# Patient Record
Sex: Male | Born: 1960 | Race: Black or African American | Hispanic: No | Smoking: Former smoker
Health system: Southern US, Community
[De-identification: ages and names within clinical notes are randomized; demographics above are authoritative.]

## PROBLEM LIST (undated history)

## (undated) DIAGNOSIS — T7840XA Allergy, unspecified, initial encounter: Secondary | ICD-10-CM

## (undated) DIAGNOSIS — K219 Gastro-esophageal reflux disease without esophagitis: Secondary | ICD-10-CM

## (undated) DIAGNOSIS — IMO0002 Reserved for concepts with insufficient information to code with codable children: Secondary | ICD-10-CM

## (undated) DIAGNOSIS — K5190 Ulcerative colitis, unspecified, without complications: Secondary | ICD-10-CM

## (undated) DIAGNOSIS — R569 Unspecified convulsions: Secondary | ICD-10-CM

## (undated) HISTORY — PX: HERNIA REPAIR: SHX51

## (undated) HISTORY — DX: Allergy, unspecified, initial encounter: T78.40XA

## (undated) HISTORY — DX: Reserved for concepts with insufficient information to code with codable children: IMO0002

## (undated) HISTORY — DX: Gastro-esophageal reflux disease without esophagitis: K21.9

## (undated) HISTORY — DX: Ulcerative colitis, unspecified, without complications: K51.90

## (undated) HISTORY — DX: Unspecified convulsions: R56.9

---

## 2011-01-20 ENCOUNTER — Ambulatory Visit (INDEPENDENT_AMBULATORY_CARE_PROVIDER_SITE_OTHER): Payer: BC Managed Care – PPO

## 2011-01-20 DIAGNOSIS — E291 Testicular hypofunction: Secondary | ICD-10-CM

## 2011-02-20 ENCOUNTER — Ambulatory Visit (INDEPENDENT_AMBULATORY_CARE_PROVIDER_SITE_OTHER): Payer: BC Managed Care – PPO

## 2011-02-20 DIAGNOSIS — E236 Other disorders of pituitary gland: Secondary | ICD-10-CM

## 2011-03-03 ENCOUNTER — Ambulatory Visit (INDEPENDENT_AMBULATORY_CARE_PROVIDER_SITE_OTHER): Payer: BC Managed Care – PPO

## 2011-03-03 DIAGNOSIS — B9789 Other viral agents as the cause of diseases classified elsewhere: Secondary | ICD-10-CM

## 2011-03-05 ENCOUNTER — Ambulatory Visit (INDEPENDENT_AMBULATORY_CARE_PROVIDER_SITE_OTHER): Payer: BC Managed Care – PPO

## 2011-03-05 DIAGNOSIS — H6120 Impacted cerumen, unspecified ear: Secondary | ICD-10-CM

## 2011-03-05 DIAGNOSIS — E291 Testicular hypofunction: Secondary | ICD-10-CM

## 2011-03-05 DIAGNOSIS — J019 Acute sinusitis, unspecified: Secondary | ICD-10-CM

## 2011-03-05 DIAGNOSIS — J40 Bronchitis, not specified as acute or chronic: Secondary | ICD-10-CM

## 2011-03-21 ENCOUNTER — Telehealth: Payer: Self-pay

## 2011-03-21 ENCOUNTER — Ambulatory Visit (INDEPENDENT_AMBULATORY_CARE_PROVIDER_SITE_OTHER): Payer: BC Managed Care – PPO | Admitting: Emergency Medicine

## 2011-03-21 VITALS — BP 106/71 | HR 81 | Temp 98.1°F | Resp 16 | Ht 69.0 in | Wt 179.8 lb

## 2011-03-21 DIAGNOSIS — H6120 Impacted cerumen, unspecified ear: Secondary | ICD-10-CM

## 2011-03-21 DIAGNOSIS — IMO0001 Reserved for inherently not codable concepts without codable children: Secondary | ICD-10-CM

## 2011-03-21 DIAGNOSIS — E291 Testicular hypofunction: Secondary | ICD-10-CM

## 2011-03-21 DIAGNOSIS — J029 Acute pharyngitis, unspecified: Secondary | ICD-10-CM

## 2011-03-21 LAB — POCT RAPID STREP A (OFFICE): Rapid Strep A Screen: NEGATIVE

## 2011-03-21 MED ORDER — TESTOSTERONE CYPIONATE 100 MG/ML IM SOLN
200.0000 mg | Freq: Once | INTRAMUSCULAR | Status: AC
  Administered 2011-03-21: 200 mg via INTRAMUSCULAR

## 2011-03-21 NOTE — Progress Notes (Signed)
Addended by: Areta Haber B on: 03/21/2011 10:19 AM   Modules accepted: Orders

## 2011-03-21 NOTE — Telephone Encounter (Signed)
.  [facility]   PT SAW DR. Joseph Art THIS AM - FOR INSURANCE PURPOSES - REQUESTING [facility] [phone]

## 2011-03-21 NOTE — Patient Instructions (Signed)
Patient is to stop his Mobic. He is placed on Nexium to take one twice a day for the first week and then went back to one a day. He was given a prescription for [facility]'s mouthwash 1 teaspoon [place] gargle 4 times a day.

## 2011-03-21 NOTE — Telephone Encounter (Signed)
See message under contacts.

## 2011-03-21 NOTE — Progress Notes (Signed)
  Subjective:    Patient ID: Marcus Gutierrez, male    DOB: [birth year], 51 y.o.   MRN: [id number]  HPI presents with a one-week history of severe pain in his throat. He is currently on amoxicillin and meloxicam. He has had severe discomfort in his uvula. He's had no chest congestion or cough. He does have an inability to hear well out of his left ear. He also said significant abdominal discomfort as well as distention and tenderness in his upper abdomen.    Review of Systems noncontributory except as related to the present illness.     Objective:   Physical Exam His HEENT exam reveals some wax obstruction of left external auditory canal. There is a 3 mm ulcer on the right side his uvula. There is no adenopathy noted on exam. His abdominal exam reveals marked tenderness in the epigastric area.       Assessment & Plan:  Patient has an ulcer on his uvula which is probably the source of this throat discomfort he has been on Mobic and has probably developed a gastritis or even possibly ulcer related to this medication. His been off his Nexium for the last month. He has approximately 3 days left on his meloxicam amoxicillin prescription. We'll check a strep test ahead and irrigate his left ear pain he is due for his testosterone injection which he receives every 2 weeks.

## 2011-03-22 MED ORDER — ESOMEPRAZOLE MAGNESIUM 40 MG PO CPDR: 40.0000 mg | DELAYED_RELEASE_CAPSULE | Freq: Every day | ORAL | Status: DC

## 2011-03-22 MED ORDER — MAGIC MOUTHWASH W/LIDOCAINE: 5.0000 mL | Freq: Four times a day (QID) | ORAL | Status: DC

## 2011-03-22 NOTE — Progress Notes (Signed)
Addended by: Lesle Chris A on: 03/22/2011 12:00 PM   Modules accepted: Orders

## 2011-03-26 NOTE — Telephone Encounter (Signed)
Dr Everlene Farrier, it looks like pt saw you on 03/21/11 (not sure why phone message says Dr L) and you placed him on Nexium 40. Pt requesting 60 mg - see message.

## 2011-03-26 NOTE — Telephone Encounter (Signed)
Called [facility] who reported that ins covered #30 of Nexium 40 (15 day supply) for pt. Also gave me phone number they have on file for pt since neither #s in our system are working #s. Tried to call pt to ask him what is the problem with Rx and insurance (maybe a Pr Auth is needed?), but no answer/no VM at number given by pharmacy [phone].

## 2011-03-26 NOTE — Telephone Encounter (Signed)
Please notify Mr. Paulsen  that the Nexium does not come in a 60 mg dosage. A dosage of 40 mg per day is the maximum dose. If there is problem with this please give me another note.

## 2011-03-27 NOTE — Telephone Encounter (Signed)
Gave pt instructions from Dr Everlene Farrier about Prilosec if he wants to try that. Also called pharmacy per pts request and authorized them to dispense Nexium 40 in either 30 days at a time, or 90 days per pts preference

## 2011-03-27 NOTE — Telephone Encounter (Signed)
The patient is a generic for Nexium. He can take Prilosec 20 mg and take 2 a day which may be able to give him relief. That is a generic.

## 2011-03-27 NOTE — Telephone Encounter (Signed)
Pt called back and clarified what he needs to ask about Nexium. He wants to check with Dr Everlene Farrier to see if there is a generic that might work as effectively for him. If not, he would like [place] to OK him getting either  30-day or 90-day supply of Nexium 40 at a time depending on the cost and his decision. Pt states we may LMOM at [phone] if necessary.

## 2011-03-27 NOTE — Telephone Encounter (Signed)
Spoke with pt's wife and asked her to have pt CB. Wife agreed.

## 2011-03-30 ENCOUNTER — Telehealth: Payer: Self-pay | Admitting: Emergency Medicine

## 2011-03-30 NOTE — Telephone Encounter (Addendum)
Message copied by Darlyne Russian on Sat Mar 30, 2011  8:14 AM ------      Message from: [place], [place]      Created: Fri Mar 22, 2011 10:24 AM      Regarding: Orders for Visit       Dr. Everlene Farrier,      I looked into the Addendum Notification for Marcus Gutierrez and we will need to get together on this before we "done" this message.  According to [facility], the patient needs 2 orders placed in Epic because you had handed him written scripts.        Corbin Ade      I cannot remember what 2 prescriptions I handed to [name] when he was in the office. We will have to ask him when he returns for his followup visit.

## 2011-04-05 ENCOUNTER — Ambulatory Visit (INDEPENDENT_AMBULATORY_CARE_PROVIDER_SITE_OTHER): Payer: BC Managed Care – PPO | Admitting: Physician Assistant

## 2011-04-05 DIAGNOSIS — E291 Testicular hypofunction: Secondary | ICD-10-CM

## 2011-04-05 DIAGNOSIS — E236 Other disorders of pituitary gland: Secondary | ICD-10-CM

## 2011-04-05 MED ORDER — TESTOSTERONE CYPIONATE 200 MG/ML IM SOLN: 200.0000 mg | Freq: Once | INTRAMUSCULAR | Status: DC

## 2011-04-05 MED ORDER — TESTOSTERONE CYPIONATE 200 MG/ML IM SOLN
200.0000 mg | INTRAMUSCULAR | Status: DC
  Administered 2011-04-05 – 2011-07-08 (×4): 200 mg via INTRAMUSCULAR

## 2011-04-08 NOTE — Progress Notes (Signed)
Testosterone injection

## 2011-04-21 ENCOUNTER — Ambulatory Visit (INDEPENDENT_AMBULATORY_CARE_PROVIDER_SITE_OTHER): Payer: BC Managed Care – PPO | Admitting: Radiology

## 2011-04-21 DIAGNOSIS — E236 Other disorders of pituitary gland: Secondary | ICD-10-CM

## 2011-04-21 DIAGNOSIS — E291 Testicular hypofunction: Secondary | ICD-10-CM

## 2011-05-06 ENCOUNTER — Ambulatory Visit (INDEPENDENT_AMBULATORY_CARE_PROVIDER_SITE_OTHER): Payer: BC Managed Care – PPO | Admitting: Internal Medicine

## 2011-05-06 VITALS — BP 103/69 | HR 99 | Temp 98.1°F | Resp 18 | Ht 69.0 in | Wt 184.0 lb

## 2011-05-06 DIAGNOSIS — E291 Testicular hypofunction: Secondary | ICD-10-CM

## 2011-05-06 NOTE — Progress Notes (Signed)
  Subjective:    Patient ID: Marcus Gutierrez, male    DOB: 03-24-1960, 51 y.o.   MRN: [id number]  HPI    Review of Systems     Objective:   Physical Exam        Assessment & Plan:  Pt needing testosterone level, plans to return next time for lab work.  Testosterone level given today.

## 2011-05-21 ENCOUNTER — Ambulatory Visit (INDEPENDENT_AMBULATORY_CARE_PROVIDER_SITE_OTHER): Payer: BC Managed Care – PPO | Admitting: Physician Assistant

## 2011-05-21 DIAGNOSIS — E291 Testicular hypofunction: Secondary | ICD-10-CM

## 2011-05-21 DIAGNOSIS — E236 Other disorders of pituitary gland: Secondary | ICD-10-CM

## 2011-05-21 MED ORDER — TESTOSTERONE CYPIONATE 200 MG/ML IM SOLN
200.0000 mg | INTRAMUSCULAR | Status: DC
  Administered 2011-05-21 – 2011-08-08 (×5): 200 mg via INTRAMUSCULAR

## 2011-05-21 NOTE — Progress Notes (Signed)
Patient here for testosterone injection. Last PSA 09/2010 normal. Will need recheck Prostate/PSA with next injection. Ok to give testosterone injection.  Marcus Gutierrez

## 2011-06-03 ENCOUNTER — Ambulatory Visit (INDEPENDENT_AMBULATORY_CARE_PROVIDER_SITE_OTHER): Payer: BC Managed Care – PPO | Admitting: Physician Assistant

## 2011-06-03 DIAGNOSIS — E291 Testicular hypofunction: Secondary | ICD-10-CM

## 2011-06-04 LAB — PSA: PSA: 0.7 ng/mL (ref <=4.00)

## 2011-06-04 LAB — TESTOSTERONE: Testosterone: 409.53 ng/dL (ref 300–890)

## 2011-06-24 ENCOUNTER — Telehealth: Payer: Self-pay | Admitting: *Deleted

## 2011-06-24 ENCOUNTER — Ambulatory Visit (INDEPENDENT_AMBULATORY_CARE_PROVIDER_SITE_OTHER): Payer: BC Managed Care – PPO | Admitting: *Deleted

## 2011-06-24 DIAGNOSIS — E291 Testicular hypofunction: Secondary | ICD-10-CM

## 2011-06-24 MED ORDER — TESTOSTERONE CYPIONATE 100 MG/ML IM SOLN: 200.0000 mg | INTRAMUSCULAR | Status: DC

## 2011-06-24 NOTE — Telephone Encounter (Signed)
Pt came in office and noticed that he needs a refill on Testosterone.

## 2011-06-24 NOTE — Telephone Encounter (Signed)
Done and printed

## 2011-06-25 ENCOUNTER — Telehealth: Payer: Self-pay | Admitting: Family Medicine

## 2011-06-25 ENCOUNTER — Other Ambulatory Visit: Payer: Self-pay | Admitting: Family Medicine

## 2011-06-25 ENCOUNTER — Telehealth: Payer: Self-pay | Admitting: Emergency Medicine

## 2011-06-25 MED ORDER — TESTOSTERONE CYPIONATE 200 MG/ML IM SOLN: 200.0000 mg | INTRAMUSCULAR | Status: DC

## 2011-06-25 NOTE — Telephone Encounter (Signed)
LM WITH LADY THAT RX WAS SENT IN

## 2011-06-25 NOTE — Telephone Encounter (Signed)
Message copied by Darlyne Russian on Tue Jun 25, 2011  7:36 AM ------      Message from: Kem Boroughs D      Created: Mon Jun 24, 2011  3:04 PM       Can you review labwork.

## 2011-06-25 NOTE — Telephone Encounter (Signed)
LM WITH LADY TO CB

## 2011-06-25 NOTE — Telephone Encounter (Signed)
Pt returning [place] phone call please contact pt [phone]

## 2011-06-25 NOTE — Telephone Encounter (Signed)
Patient came to office after we closed, banging on the door that his testosterone was sent in incorrectly.  He was let in, and after reviewing his meds, the testosterone wassent in for 136m to inject 2 mls every 14 days.  The 1095mcost is higher than the 20065mial.  Ok per EriMarella Chimes rx the old dosage, 200m63m 1 ml q 14 days, dispense 10ml61mh 2 refills.  Patient given rx.

## 2011-06-25 NOTE — Telephone Encounter (Signed)
Please call patient and let him know his PSA and testosterone levels are normal. He needs to continue the same medication program.

## 2011-06-25 NOTE — Telephone Encounter (Signed)
PT NOTIFIED OF RESULTS AND THAT RX WAS SENT IN

## 2011-07-08 ENCOUNTER — Ambulatory Visit (INDEPENDENT_AMBULATORY_CARE_PROVIDER_SITE_OTHER): Payer: BC Managed Care – PPO | Admitting: Physician Assistant

## 2011-07-08 DIAGNOSIS — E236 Other disorders of pituitary gland: Secondary | ICD-10-CM

## 2011-07-08 DIAGNOSIS — E291 Testicular hypofunction: Secondary | ICD-10-CM

## 2011-07-08 NOTE — Progress Notes (Signed)
Presents for testosterone injection. Due for OV with labs and prostate exam 07/2011.

## 2011-07-22 ENCOUNTER — Ambulatory Visit (INDEPENDENT_AMBULATORY_CARE_PROVIDER_SITE_OTHER): Payer: BC Managed Care – PPO | Admitting: Physician Assistant

## 2011-07-22 ENCOUNTER — Telehealth: Payer: Self-pay

## 2011-07-22 DIAGNOSIS — E291 Testicular hypofunction: Secondary | ICD-10-CM

## 2011-07-22 NOTE — Telephone Encounter (Signed)
Dr. Everlene Farrier, pt just turned 36 and wants to know if you can make him an appt for a colonoscopy. Thanks

## 2011-07-23 ENCOUNTER — Other Ambulatory Visit: Payer: Self-pay | Admitting: Emergency Medicine

## 2011-07-23 DIAGNOSIS — Z139 Encounter for screening, unspecified: Secondary | ICD-10-CM

## 2011-07-23 NOTE — Telephone Encounter (Signed)
Pt.notified

## 2011-07-23 NOTE — Telephone Encounter (Signed)
Call patient and let him know I sent him a referral for screening colonoscopy

## 2011-08-08 ENCOUNTER — Ambulatory Visit (INDEPENDENT_AMBULATORY_CARE_PROVIDER_SITE_OTHER): Payer: BC Managed Care – PPO | Admitting: Physician Assistant

## 2011-08-08 ENCOUNTER — Encounter: Payer: Self-pay | Admitting: Physician Assistant

## 2011-08-08 VITALS — BP 114/77 | HR 91 | Temp 98.4°F | Resp 16 | Ht 68.5 in | Wt 184.0 lb

## 2011-08-08 DIAGNOSIS — E291 Testicular hypofunction: Secondary | ICD-10-CM

## 2011-08-08 NOTE — Progress Notes (Signed)
   Patient ID: Marcus Gutierrez MRN: [id number], DOB: 07-03-1960, 51 y.o. Date of Encounter: 08/08/2011, 7:54 AM  Primary Physician: Kennon Portela, MD  Chief Complaint: Here for testosterone injection  51 y.o. year old male here for testosterone injection. Last injection 07/22/11.  Last PSA 0.70 on 06/03/11. Ok to give testosterone injection. Will need office visit in October, 2013. This was a nursing only encounter. No provider/patient encounter occurred today. Next testosterone injection due on 7/11/3.   SignedChristell Faith, PA-C 08/08/2011 7:54 AM

## 2011-08-21 ENCOUNTER — Ambulatory Visit (INDEPENDENT_AMBULATORY_CARE_PROVIDER_SITE_OTHER): Payer: BC Managed Care – PPO | Admitting: Physician Assistant

## 2011-08-21 DIAGNOSIS — E236 Other disorders of pituitary gland: Secondary | ICD-10-CM

## 2011-08-21 DIAGNOSIS — E291 Testicular hypofunction: Secondary | ICD-10-CM

## 2011-08-21 MED ORDER — TESTOSTERONE CYPIONATE 200 MG/ML IM SOLN
200.0000 mg | INTRAMUSCULAR | Status: DC
  Administered 2011-08-21 – 2011-10-05 (×4): 200 mg via INTRAMUSCULAR

## 2011-08-21 NOTE — Progress Notes (Signed)
   Patient ID: ADE STMARIE MRN: [id number], DOB: 1960-06-16, 51 y.o. Date of Encounter: 08/21/2011, 7:49 AM  Primary Physician: Tally Due, MD  Chief Complaint: Here for testosterone injection  51 y.o. year old male here for testosterone injection. Last injection 08/08/11.  Last PSA 0.70 on 06/03/11. Ok to give testosterone injection. Will need office visit in 3 months. This was a nursing only encounter. No provider/patient encounter occurred today.    Signed, Eula Listen, PA-C 08/21/2011 7:49 AM

## 2011-09-05 ENCOUNTER — Encounter: Payer: BC Managed Care – PPO | Admitting: Physician Assistant

## 2011-09-06 ENCOUNTER — Ambulatory Visit (INDEPENDENT_AMBULATORY_CARE_PROVIDER_SITE_OTHER): Payer: BC Managed Care – PPO | Admitting: Physician Assistant

## 2011-09-06 DIAGNOSIS — E895 Postprocedural testicular hypofunction: Secondary | ICD-10-CM

## 2011-09-06 DIAGNOSIS — E291 Testicular hypofunction: Secondary | ICD-10-CM

## 2011-09-19 ENCOUNTER — Ambulatory Visit (INDEPENDENT_AMBULATORY_CARE_PROVIDER_SITE_OTHER): Payer: BC Managed Care – PPO | Admitting: Physician Assistant

## 2011-09-19 DIAGNOSIS — E291 Testicular hypofunction: Secondary | ICD-10-CM

## 2011-09-19 NOTE — Progress Notes (Signed)
OK to get testosterone injection

## 2011-10-05 ENCOUNTER — Ambulatory Visit (INDEPENDENT_AMBULATORY_CARE_PROVIDER_SITE_OTHER): Payer: BC Managed Care – PPO

## 2011-10-05 DIAGNOSIS — E291 Testicular hypofunction: Secondary | ICD-10-CM

## 2011-10-24 ENCOUNTER — Ambulatory Visit (INDEPENDENT_AMBULATORY_CARE_PROVIDER_SITE_OTHER): Payer: BC Managed Care – PPO | Admitting: Physician Assistant

## 2011-10-24 DIAGNOSIS — E291 Testicular hypofunction: Secondary | ICD-10-CM

## 2011-10-24 MED ORDER — TESTOSTERONE CYPIONATE 200 MG/ML IM SOLN
200.0000 mg | INTRAMUSCULAR | Status: DC
  Administered 2011-10-24: 200 mg via INTRAMUSCULAR

## 2011-10-24 NOTE — Progress Notes (Signed)
Patient here for testosterone injection. Ok to give today. Will need office visit in October 2013.

## 2011-10-29 NOTE — Progress Notes (Signed)
Patient presented for injection only. 

## 2011-10-29 NOTE — Progress Notes (Signed)
Patient presented for injection only.

## 2011-11-14 ENCOUNTER — Ambulatory Visit (INDEPENDENT_AMBULATORY_CARE_PROVIDER_SITE_OTHER): Payer: BC Managed Care – PPO | Admitting: Emergency Medicine

## 2011-11-14 DIAGNOSIS — E291 Testicular hypofunction: Secondary | ICD-10-CM

## 2011-11-14 MED ORDER — TESTOSTERONE CYPIONATE 200 MG/ML IM SOLN
200.0000 mg | Freq: Once | INTRAMUSCULAR | Status: AC
  Administered 2011-11-14: 200 mg via INTRAMUSCULAR

## 2011-11-14 MED ORDER — TESTOSTERONE CYPIONATE 200 MG/ML IM SOLN: 200.0000 mg | Freq: Once | INTRAMUSCULAR | Status: DC

## 2011-11-14 NOTE — Progress Notes (Signed)
Patient presents her for testosterone injection. He will return before the end of the month for labs.

## 2011-11-19 ENCOUNTER — Encounter: Payer: Self-pay | Admitting: Emergency Medicine

## 2011-11-29 ENCOUNTER — Ambulatory Visit (INDEPENDENT_AMBULATORY_CARE_PROVIDER_SITE_OTHER): Payer: BC Managed Care – PPO | Admitting: Physician Assistant

## 2011-11-29 VITALS — BP 128/70 | HR 103 | Temp 98.8°F | Resp 18 | Ht 70.0 in | Wt 177.0 lb

## 2011-11-29 DIAGNOSIS — E291 Testicular hypofunction: Secondary | ICD-10-CM | POA: Insufficient documentation

## 2011-11-29 MED ORDER — TESTOSTERONE CYPIONATE 200 MG/ML IM SOLN
200.0000 mg | Freq: Once | INTRAMUSCULAR | Status: AC
  Administered 2011-11-29: 200 mg via INTRAMUSCULAR

## 2011-11-29 NOTE — Progress Notes (Signed)
Presents for testosterone injection.  He was advised that he needs and OV, with examination and labs with his next injection.

## 2011-12-04 ENCOUNTER — Telehealth: Payer: Self-pay | Admitting: Radiology

## 2011-12-04 ENCOUNTER — Ambulatory Visit: Payer: BC Managed Care – PPO

## 2011-12-04 ENCOUNTER — Ambulatory Visit (INDEPENDENT_AMBULATORY_CARE_PROVIDER_SITE_OTHER): Payer: BC Managed Care – PPO | Admitting: Family Medicine

## 2011-12-04 VITALS — BP 110/90 | HR 109 | Temp 98.3°F | Resp 16 | Ht 69.0 in | Wt 172.0 lb

## 2011-12-04 DIAGNOSIS — R5381 Other malaise: Secondary | ICD-10-CM

## 2011-12-04 DIAGNOSIS — Z125 Encounter for screening for malignant neoplasm of prostate: Secondary | ICD-10-CM

## 2011-12-04 DIAGNOSIS — R509 Fever, unspecified: Secondary | ICD-10-CM

## 2011-12-04 DIAGNOSIS — M2550 Pain in unspecified joint: Secondary | ICD-10-CM

## 2011-12-04 DIAGNOSIS — Q159 Congenital malformation of eye, unspecified: Secondary | ICD-10-CM

## 2011-12-04 LAB — POCT CBC
Granulocyte percent: 59.5 %G (ref 37–80)
HCT, POC: 48 % (ref 43.5–53.7)
Hemoglobin: 14.7 g/dL (ref 14.1–18.1)
Lymph, poc: 3.4 (ref 0.6–3.4)
MCH, POC: 27.9 pg (ref 27–31.2)
MCHC: 30.6 g/dL — AB (ref 31.8–35.4)
MCV: 91 fL (ref 80–97)
MID (cbc): 1.1 — AB (ref 0–0.9)
MPV: 10.1 fL (ref 0–99.8)
POC Granulocyte: 6.6 (ref 2–6.9)
POC LYMPH PERCENT: 30.6 %L (ref 10–50)
POC MID %: 9.9 %M (ref 0–12)
Platelet Count, POC: 320 10*3/uL (ref 142–424)
RBC: 5.27 M/uL (ref 4.69–6.13)
RDW, POC: 13.4 %
WBC: 11.1 10*3/uL — AB (ref 4.6–10.2)

## 2011-12-04 LAB — CK: Total CK: 91 U/L (ref 7–232)

## 2011-12-04 LAB — POCT UA - MICROSCOPIC ONLY
Casts, Ur, LPF, POC: NEGATIVE
Crystals, Ur, HPF, POC: NEGATIVE
Mucus, UA: POSITIVE
Yeast, UA: NEGATIVE

## 2011-12-04 LAB — POCT INFLUENZA A/B
Influenza A, POC: NEGATIVE
Influenza B, POC: NEGATIVE

## 2011-12-04 LAB — COMPREHENSIVE METABOLIC PANEL
ALT: 23 U/L (ref 0–53)
AST: 25 U/L (ref 0–37)
Albumin: 4.1 g/dL (ref 3.5–5.2)
Alkaline Phosphatase: 92 U/L (ref 39–117)
BUN: 12 mg/dL (ref 6–23)
CO2: 24 mEq/L (ref 19–32)
Calcium: 9.1 mg/dL (ref 8.4–10.5)
Chloride: 99 mEq/L (ref 96–112)
Creat: 1.05 mg/dL (ref 0.50–1.35)
Glucose, Bld: 83 mg/dL (ref 70–99)
Potassium: 4.2 mEq/L (ref 3.5–5.3)
Sodium: 137 mEq/L (ref 135–145)
Total Bilirubin: 1.1 mg/dL (ref 0.3–1.2)
Total Protein: 7.6 g/dL (ref 6.0–8.3)

## 2011-12-04 LAB — POCT URINALYSIS DIPSTICK
Glucose, UA: NEGATIVE
Ketones, UA: >=160
Leukocytes, UA: NEGATIVE
Nitrite, UA: NEGATIVE
Protein, UA: 100
Spec Grav, UA: 1.02
Urobilinogen, UA: 1
pH, UA: 6.5

## 2011-12-04 LAB — GLUCOSE, POCT (MANUAL RESULT ENTRY): POC Glucose: 81 mg/dl (ref 70–99)

## 2011-12-04 LAB — POCT SEDIMENTATION RATE: POCT SED RATE: 66 mm/hr — AB (ref 0–22)

## 2011-12-04 LAB — PSA: PSA: 1.64 ng/mL (ref <=4.00)

## 2011-12-04 MED ORDER — DOXYCYCLINE HYCLATE 100 MG PO TABS: 100.0000 mg | ORAL_TABLET | Freq: Two times a day (BID) | ORAL | Status: DC

## 2011-12-04 NOTE — Progress Notes (Signed)
[facility] [address] [place] [address] [phone]  Date:  12/04/2011   Name:  Marcus Gutierrez   DOB:  August 18, 1960   MRN:  [id number]  PCP:  Kennon Portela, MD    Chief Complaint: Generalized Body Aches, Fever, Chest Pain and Chills   History of Present Illness:  Marcus Gutierrez is a 51 y.o. very pleasant male patient who presents with the following:  He has been ill for several days- first became ill on Friday, today is Wednesday.  Early Friday am he woke up with severe aches in his muscles and joints, and then developed sweats, fevers, chills, and pain with deep inspiration.  He does not really have a cough.  He has not had substernal or typical CP, but he notes that his chest hurts when he tries to take a large breath or move his trunk.     He had a fever to 102.4 this am which he treated with antipyretics.  He had a lower grade fever earlier in the illness, but today was his first high fever.  No ST.  Sneezing, no runny nose.  No GI symptoms.  No sick contacts.  He has not been eating a lot but has drunk plenty of water.  He drank at least 4 16 oz glasses today   He has been taking hydrocodone for the body aches for the last couple of days.   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  No foreign travel.  He does not spend a lot of time outdoors or have any particular tick exposure risk.   He did start Verdi in September- he was diagnosed with UC. He has noted that since he started this medication his urine has seemed darker than usual.  Patient Active Problem List  Diagnosis  . Hypogonadism male    Past Medical History  Diagnosis Date  . Allergy   . GERD (gastroesophageal reflux disease)   . Seizures   . Ulcer     Past Surgical History  Procedure Date  . Hernia repair     History  Substance Use Topics  . Smoking status: Former Smoker    Quit date: 06/01/2010  . Smokeless tobacco: Not on file  . Alcohol Use: No    Family History  Problem  Relation Age of Onset  . Cancer Mother   . Heart attack Mother   . Heart attack Father   . Lupus Sister   . Asthma Son     Allergies  Allergen Reactions  . Amoxicillin Other (See Comments)    hematochezia  . Androgel (Testosterone)     Medication list has been reviewed and updated.  Current Outpatient Prescriptions on File Prior to Visit  Medication Sig Dispense Refill  . esomeprazole (NEXIUM) 40 MG capsule Take 1 capsule (40 mg total) by mouth daily before breakfast.  30 capsule  12  . mesalamine (LIALDA) 1.2 G EC tablet Take 1,200 mg by mouth daily with breakfast.      . Alum & Mag Hydroxide-Simeth (MAGIC MOUTHWASH W/LIDOCAINE) SOLN Take 5 mLs by mouth 4 (four) times daily.  120 mL  1  . amoxicillin (AMOXIL) 500 MG capsule Take 500 mg by mouth 2 (two) times daily.      . meloxicam (MOBIC) 7.5 MG tablet Take 7.5 mg by mouth 2 (two) times daily.       Current Facility-Administered Medications on File Prior to Visit  Medication Dose Route Frequency Provider Last Rate Last  Dose  . testosterone cypionate (DEPOTESTOTERONE CYPIONATE) injection 200 mg  200 mg Intramuscular Q14 Days Chelle S Jeffery, PA-C   200 mg at 07/08/11 0914  . testosterone cypionate (DEPOTESTOTERONE CYPIONATE) injection 200 mg  200 mg Intramuscular Q14 Days Areta Haber Dunn, PA-C   200 mg at 08/08/11 0815  . testosterone cypionate (DEPOTESTOTERONE CYPIONATE) injection 200 mg  200 mg Intramuscular Q14 Days Ryan M Dunn, PA-C   200 mg at 10/05/11 5993    Review of Systems:  As per HPI- otherwise negative.  Physical Examination: Filed Vitals:   12/04/11 1355  BP: 110/90  Pulse: 109  Temp: 98.3 F (36.8 C)  Resp: 16   Filed Vitals:   12/04/11 1355  Height: 5' 9"  (1.753 m)  Weight: 172 lb (78.019 kg)   Body mass index is 25.40 kg/(m^2). Ideal Body Weight: Weight in (lb) to have BMI = 25: 168.9   GEN: WDWN, NAD, Non-toxic, A & O x 3 HEENT: Atraumatic, Normocephalic. Neck supple. No masses, No LAD.  Bilateral  TM wnl, oropharynx normal.  PEERL,EOMI.  In his left eye there is what appears to be a scar or small tear of his iris.  Ears and Nose: No external deformity. CV: RRR, No M/G/R. No JVD. No thrill. No extra heart sounds. PULM: CTA B, no wheezes, crackles, rhonchi. No retractions. No resp. distress. No accessory muscle use. ABD: S, NT, ND, +BS. No rebound. No HSM. EXTR: No c/c/e.  No rash, no joint effusions, heat or redness.  NEURO he appears to be sore in his body and to have pain with walking.   Able to reproduce pains by pressing on his chest wall and by moving his knee joints.  PSYCH: Normally interactive. Conversant. Not depressed or anxious appearing.  Calm demeanor.   [facility] reading (PRIMARY) by  Dr. Lorelei Pont.  CXR: no active disease  CHEST - 2 VIEW  Comparison: Preliminary reading of Dr. Lorelei Pont  Findings: Cardiomediastinal silhouette is unremarkable. No acute infiltrate or pleural effusion. No pulmonary edema. Bony thorax is unremarkable.  IMPRESSION: No active disease.   Results for orders placed in visit on 12/04/11  POCT CBC      Component Value Range   WBC 11.1 (*) 4.6 - 10.2 K/uL   Lymph, poc 3.4  0.6 - 3.4   POC LYMPH PERCENT 30.6  10 - 50 %L   MID (cbc) 1.1 (*) 0 - 0.9   POC MID % 9.9  0 - 12 %M   POC Granulocyte 6.6  2 - 6.9   Granulocyte percent 59.5  37 - 80 %G   RBC 5.27  4.69 - 6.13 M/uL   Hemoglobin 14.7  14.1 - 18.1 g/dL   HCT, POC 48.0  43.5 - 53.7 %   MCV 91.0  80 - 97 fL   MCH, POC 27.9  27 - 31.2 pg   MCHC 30.6 (*) 31.8 - 35.4 g/dL   RDW, POC 13.4     Platelet Count, POC 320  142 - 424 K/uL   MPV 10.1  0 - 99.8 fL  GLUCOSE, POCT (MANUAL RESULT ENTRY)      Component Value Range   POC Glucose 81  70 - 99 mg/dl  POCT INFLUENZA A/B      Component Value Range   Influenza A, POC Negative     Influenza B, POC Negative    COMPREHENSIVE METABOLIC PANEL      Component Value Range   Sodium 137  135 - 145  mEq/L   Potassium 4.2  3.5 - 5.3 mEq/L   Chloride  99  96 - 112 mEq/L   CO2 24  19 - 32 mEq/L   Glucose, Bld 83  70 - 99 mg/dL   BUN 12  6 - 23 mg/dL   Creat 1.05  0.50 - 1.35 mg/dL   Total Bilirubin 1.1  0.3 - 1.2 mg/dL   Alkaline Phosphatase 92  39 - 117 U/L   AST 25  0 - 37 U/L   ALT 23  0 - 53 U/L   Total Protein 7.6  6.0 - 8.3 g/dL   Albumin 4.1  3.5 - 5.2 g/dL   Calcium 9.1  8.4 - 10.5 mg/dL  CK      Component Value Range   Total CK 91  7 - 232 U/L  PSA      Component Value Range   PSA    <=4.00 ng/mL  POCT URINALYSIS DIPSTICK      Component Value Range   Color, UA amber     Clarity, UA clear     Glucose, UA neg     Bilirubin, UA moderate     Ketones, UA >=160     Spec Grav, UA 1.020     Blood, UA trace-lysed     pH, UA 6.5     Protein, UA 100 mg/dL     Urobilinogen, UA 1.0     Nitrite, UA neg     Leukocytes, UA Negative    POCT UA - MICROSCOPIC ONLY      Component Value Range   WBC, Ur, HPF, POC 3-10     RBC, urine, microscopic 4-6     Bacteria, U Microscopic 1+     Mucus, UA positive     Epithelial cells, urine per micros 0-1     Crystals, Ur, HPF, POC neg     Casts, Ur, LPF, POC neg     Yeast, UA neg    POCT SEDIMENTATION RATE      Component Value Range   POCT SED RATE 66 (*) 0 - 22 mm/hr    Assessment and Plan: 1. Fever  POCT CBC, POCT Influenza A/B, DG Chest 2 View  2. Malaise  POCT glucose (manual entry), POCT urinalysis dipstick, POCT UA - Microscopic Only, POCT SEDIMENTATION RATE  3. Joint pain  Comprehensive metabolic panel, CK  4. Screening for prostate cancer  PSA   Jevan is here with a cluster of symptoms today.  I am concerned about his urine findings and have ordered a STAT CMP.  Depending on these results may treat for possible RMSF vs sending him to the ED for further treatment (if any significant liver or kidney lab abnl).  (Lialda could cause liver or kidney toxicity)  COPLAND,JESSICA, MD  Received CMP results- they are normal.  Called and spoke with Everlean Patterson.   We will start  doxycycline as RMSF is a possibility.  This could also be a manifestation of his UC.  Urine culture is pending but pyelo is not likely.  He is to call or go to the ED if he gets worse or has any concerns tonight.  Otherwise plan to have him call his GI doctor tomorrow regarding his symptoms.  I will call him tomorrow and check on him as well.    He has an unusual finding in his left iris.  This is not acute, but needs to be evaluated.  Will refer to opthalmology for an exam.  His vision is normal and he was not aware of anything amiss.

## 2011-12-04 NOTE — Telephone Encounter (Signed)
Stat labs are in for patient, CK is 91, other labs in computer please advise if further is needed.

## 2011-12-05 ENCOUNTER — Telehealth: Payer: Self-pay | Admitting: Family Medicine

## 2011-12-05 NOTE — Telephone Encounter (Signed)
Called him to check on him.  He is doing about the same today.  He did not feel he needed to come in for a recheck today.  He will be sure to let me know if any changes in his condition.  Otherwise I will call and check on him tomorrow.

## 2011-12-05 NOTE — Telephone Encounter (Signed)
CK is normal as is his electrolytes - I would call patient and check on his status

## 2011-12-05 NOTE — Progress Notes (Signed)
This encounter was created in error - please disregard.

## 2011-12-06 ENCOUNTER — Telehealth: Payer: Self-pay | Admitting: Family Medicine

## 2011-12-06 NOTE — Telephone Encounter (Signed)
Called and talked with him- he is about the same, maybe a bit better. He is having low grade temps to around 99 degrees.  He did agree to come in for a recheck tomorrow am.  I gave him Dr. Thompson Caul name and he will ask for her

## 2011-12-06 NOTE — Telephone Encounter (Signed)
Dr Patsy Lager called pt at 3:33 on 10/24 to check on pt -see notes 10/24 phone message

## 2011-12-07 ENCOUNTER — Ambulatory Visit: Payer: BC Managed Care – PPO

## 2011-12-07 ENCOUNTER — Ambulatory Visit (INDEPENDENT_AMBULATORY_CARE_PROVIDER_SITE_OTHER): Payer: BC Managed Care – PPO | Admitting: Family Medicine

## 2011-12-07 VITALS — BP 113/78 | HR 102 | Temp 98.1°F | Resp 18 | Ht 69.0 in | Wt 172.0 lb

## 2011-12-07 DIAGNOSIS — R509 Fever, unspecified: Secondary | ICD-10-CM

## 2011-12-07 DIAGNOSIS — M542 Cervicalgia: Secondary | ICD-10-CM

## 2011-12-07 DIAGNOSIS — M791 Myalgia, unspecified site: Secondary | ICD-10-CM

## 2011-12-07 DIAGNOSIS — M25569 Pain in unspecified knee: Secondary | ICD-10-CM

## 2011-12-07 DIAGNOSIS — IMO0001 Reserved for inherently not codable concepts without codable children: Secondary | ICD-10-CM

## 2011-12-07 DIAGNOSIS — K5190 Ulcerative colitis, unspecified, without complications: Secondary | ICD-10-CM

## 2011-12-07 DIAGNOSIS — M2550 Pain in unspecified joint: Secondary | ICD-10-CM

## 2011-12-07 LAB — COMPREHENSIVE METABOLIC PANEL
ALT: 51 U/L (ref 0–53)
AST: 56 U/L — ABNORMAL HIGH (ref 0–37)
Albumin: 3.8 g/dL (ref 3.5–5.2)
Alkaline Phosphatase: 147 U/L — ABNORMAL HIGH (ref 39–117)
BUN: 9 mg/dL (ref 6–23)
CO2: 28 mEq/L (ref 19–32)
Calcium: 9.5 mg/dL (ref 8.4–10.5)
Chloride: 101 mEq/L (ref 96–112)
Creat: 1.04 mg/dL (ref 0.50–1.35)
Glucose, Bld: 90 mg/dL (ref 70–99)
Potassium: 4.4 mEq/L (ref 3.5–5.3)
Sodium: 137 mEq/L (ref 135–145)
Total Bilirubin: 0.7 mg/dL (ref 0.3–1.2)
Total Protein: 7.7 g/dL (ref 6.0–8.3)

## 2011-12-07 LAB — POCT UA - MICROSCOPIC ONLY
Bacteria, U Microscopic: NEGATIVE
Casts, Ur, LPF, POC: NEGATIVE
Crystals, Ur, HPF, POC: NEGATIVE
Epithelial cells, urine per micros: NEGATIVE
Yeast, UA: NEGATIVE

## 2011-12-07 LAB — POCT CBC
Granulocyte percent: 53.3 %G (ref 37–80)
HCT, POC: 45.4 % (ref 43.5–53.7)
Hemoglobin: 14.5 g/dL (ref 14.1–18.1)
Lymph, poc: 2.6 (ref 0.6–3.4)
MCH, POC: 28.9 pg (ref 27–31.2)
MCHC: 31.9 g/dL (ref 31.8–35.4)
MCV: 90.5 fL (ref 80–97)
MID (cbc): 0.8 (ref 0–0.9)
MPV: 9.5 fL (ref 0–99.8)
POC Granulocyte: 3.8 (ref 2–6.9)
POC LYMPH PERCENT: 36.2 %L (ref 10–50)
POC MID %: 10.5 %M (ref 0–12)
Platelet Count, POC: 425 10*3/uL — AB (ref 142–424)
RBC: 5.02 M/uL (ref 4.69–6.13)
RDW, POC: 13.3 %
WBC: 7.2 10*3/uL (ref 4.6–10.2)

## 2011-12-07 LAB — POCT URINALYSIS DIPSTICK
Blood, UA: NEGATIVE
Glucose, UA: NEGATIVE
Nitrite, UA: NEGATIVE
Spec Grav, UA: 1.015
Urobilinogen, UA: 1
pH, UA: 6

## 2011-12-07 LAB — URINE CULTURE
Colony Count: NO GROWTH
Organism ID, Bacteria: NO GROWTH

## 2011-12-07 LAB — POCT SEDIMENTATION RATE: POCT SED RATE: 64 mm/hr — AB (ref 0–22)

## 2011-12-07 LAB — RHEUMATOID FACTOR: Rhuematoid fact SerPl-aCnc: <10 IU/mL (ref <=14)

## 2011-12-07 MED ORDER — HYDROCODONE-ACETAMINOPHEN 5-500 MG PO TABS: 1.0000 | ORAL_TABLET | Freq: Four times a day (QID) | ORAL | Status: DC | PRN

## 2011-12-07 NOTE — Progress Notes (Signed)
[address]   [place], [place]  [address]   [phone]  Subjective:    Patient ID: Marcus Gutierrez, male    DOB: October 29, 1960, 51 y.o.   MRN: [id number]  HPIThis 51 y.o. male presents for evaluation of 72 hour follow-up for fever, malaise, arthralgias, myalgias.  Evaluated by Dr. Patsy Lager three days ago.  WBC count elevated 11,100.  Urine culture negative.  CMET normal.  PSA normal at 1.43.  CK normal.  CXR negative.  Urine abnormal with large ketones, SG 1.025.  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 Patsy Lager after normal results returned.  Dr. Patsy Lager advised follow-up today due to severity of symptoms.   No worsening symptoms but no significant improvement.  Nighttimes are difficult due to pain in neck and upper shoulders and back; chest still hurting.  Slight improvement in ches tpain.  Still having knee pain across knees; having a hard time walking.  Going up and down stairs or bending knees is very difficult.  This morning temperature 98.1 this morning.  Last fever 24 hours ago.  No sore throat, rhinorrhea, nasal congestion.  No cough; no SOB.  +pain with deep inspiration.  No chest tightness.  Soreness.  No n/v/d.  No abdominal pain.  No bloody stools; no change in bowel habits.  Started Doxycycline three days ago.  No dysuria; yellow discoloration to urine; urine is dark.  No hematuria.  No frequency; urgency.  Nocturia x 1 last night which is unusual for patient.  Has been drinking more fluids since last visit.  Appetite is down but eating moderately well especially since last visit.  No rash.  No headaches since last visit; mild headaches before last visit.  Onset of symptoms started nine days ago; initially started with tingling in legs; awoke in middle of night with arthralgias/knee pain and difficulties walking. Awoke the following morning with body aches and joint aches.  Not sure if had fever at that point.  No foreign travel.  Did go to country two weeks ago for funeral; went out in a  field.  No known bites.  L anerior thigh there are marks that may be bites.  Dental surgery 08/2011.  No confusion; no photophobia.  PMH;  Ulcerative colitis, GERD, hypogonadism. Psurg:  Hernia surgery 40 years ago. 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  No tobacco; no alcohol.     Review of Systems  Constitutional: Positive for fever and fatigue. Negative for chills and diaphoresis.  HENT: Positive for neck pain and neck stiffness. Negative for ear pain, nosebleeds, congestion, facial swelling, rhinorrhea, sneezing, postnasal drip and ear discharge.   Eyes: Negative for photophobia, redness and visual disturbance.  Respiratory: Negative for cough, chest tightness, shortness of breath, wheezing and stridor.   Cardiovascular: Positive for chest pain. Negative for palpitations and leg swelling.  Gastrointestinal: Negative for nausea, vomiting, abdominal pain, diarrhea, constipation, blood in stool and abdominal distention.  Genitourinary: Negative for dysuria, urgency, frequency, hematuria, flank pain, scrotal swelling and genital sores.  Skin: Positive for wound. Negative for color change, pallor and rash.  Neurological: Negative for dizziness, syncope, weakness, light-headedness, numbness and headaches.  Hematological: Negative for adenopathy. Does not bruise/bleed easily.  Psychiatric/Behavioral: Negative for confusion.    Past Medical History  Diagnosis Date  . Allergy   . GERD (gastroesophageal reflux disease)   . Seizures   . Ulcer     Past Surgical History  Procedure Date  . Hernia repair     Prior to Admission  medications   Medication Sig Start Date End Date Taking? Authorizing Provider  Alum & Mag Hydroxide-Simeth (MAGIC MOUTHWASH W/LIDOCAINE) SOLN Take 5 mLs by mouth 4 (four) times daily. 03/22/11  Yes Collene Gobble, MD  Cholecalciferol (VITAMIN D3) 1000 UNITS CAPS Take 1 capsule by mouth daily.   Yes Historical Provider, MD  doxycycline (VIBRA-TABS) 100 MG tablet Take 1 tablet (100  mg total) by mouth 2 (two) times daily. 12/04/11  Yes Gwenlyn Found Copland, MD  esomeprazole (NEXIUM) 40 MG capsule Take 1 capsule (40 mg total) by mouth daily before breakfast. 03/22/11  Yes Collene Gobble, MD  meloxicam (MOBIC) 7.5 MG tablet Take 7.5 mg by mouth 2 (two) times daily.   Yes Historical Provider, MD  mesalamine (LIALDA) 1.2 G EC tablet Take 1,200 mg by mouth daily with breakfast.   Yes Historical Provider, MD  testosterone cypionate (DEPOTESTOTERONE CYPIONATE) 200 MG/ML injection Inject 200 mg into the muscle every 14 (fourteen) days.   Yes Historical Provider, MD  HYDROcodone-acetaminophen (VICODIN) 5-500 MG per tablet Take 1 tablet by mouth every 6 (six) hours as needed for pain. 12/07/11   Ethelda Chick, MD    Allergies  Allergen Reactions  . Amoxicillin Other (See Comments)    hematochezia  . Androgel (Testosterone)     History   Social History  . Marital Status: Single    Spouse Name: N/A    Number of Children: N/A  . Years of Education: N/A   Occupational History  . Not on file.   Social History Main Topics  . Smoking status: Former Smoker    Quit date: 06/01/2010  . Smokeless tobacco: Not on file  . Alcohol Use: No  . Drug Use: No  . Sexually Active: Not on file   Other Topics Concern  . Not on file   Social History Narrative  . No narrative on file    Family History  Problem Relation Age of Onset  . Cancer Mother   . Heart attack Mother   . Heart attack Father   . Lupus Sister   . Asthma Son        Objective:   Physical Exam  Nursing note and vitals reviewed. Constitutional: He is oriented to person, place, and time. He appears well-developed and well-nourished. No distress.       Non-toxic.  Non-ill in appearance.  HENT:  Head: Normocephalic and atraumatic.  Right Ear: External ear normal.  Left Ear: External ear normal.  Nose: Nose normal.  Mouth/Throat: Oropharynx is clear and moist. No oropharyngeal exudate.  Eyes: Conjunctivae  normal and EOM are normal. Pupils are equal, round, and reactive to light.       L pupil with well demarcated white lesion present with gross inspection with light.  Neck: Normal range of motion. Neck supple. No JVD present. No thyromegaly present.  Cardiovascular: Normal rate, regular rhythm, normal heart sounds and intact distal pulses.  Exam reveals no gallop and no friction rub.   No murmur heard. Pulmonary/Chest: Effort normal and breath sounds normal. No respiratory distress. He has no wheezes. He has no rales. He exhibits tenderness.       +TTP L LATERAL CHEST.  Abdominal: Soft. Bowel sounds are normal. He exhibits no distension and no mass. There is no tenderness. There is no rebound and no guarding.  Musculoskeletal: He exhibits no edema.       Right shoulder: He exhibits normal range of motion, no tenderness and no bony tenderness.  Left shoulder: He exhibits normal range of motion, no tenderness and no bony tenderness.       Right knee: He exhibits decreased range of motion. He exhibits no swelling, no effusion, no ecchymosis and no erythema. no tenderness found.       Left knee: He exhibits decreased range of motion. He exhibits no swelling, no effusion, no ecchymosis and no erythema. no tenderness found.       Cervical back: He exhibits decreased range of motion, tenderness, bony tenderness and pain. He exhibits no spasm and normal pulse.       CERVICAL SPINE:  MIDLINE TTP AT C4-5. B KNEES: NO EFFUSION; DECREASED ROM B KNEES; NO EFFUSION; GAIT SLOWED AND ABNORMAL.  Lymphadenopathy:    He has no cervical adenopathy.  Neurological: He is alert and oriented to person, place, and time. No cranial nerve deficit. He exhibits normal muscle tone. Coordination normal.  Skin: Skin is warm. He is not diaphoretic.       L INNER THIGH:  TWO DISCRETE HEALING ESCHAR LESIONS WITHOUT ERYTHEMA; 3 MM DIAMETER EACH LESION.  Psychiatric: He has a normal mood and affect. His behavior is normal.  Judgment and thought content normal.       Results for orders placed in visit on 12/07/11  POCT CBC      Component Value Range   WBC 7.2  4.6 - 10.2 K/uL   Lymph, poc 2.6  0.6 - 3.4   POC LYMPH PERCENT 36.2  10 - 50 %L   MID (cbc) 0.8  0 - 0.9   POC MID % 10.5  0 - 12 %M   POC Granulocyte 3.8  2 - 6.9   Granulocyte percent 53.3  37 - 80 %G   RBC 5.02  4.69 - 6.13 M/uL   Hemoglobin 14.5  14.1 - 18.1 g/dL   HCT, POC 16.1  09.6 - 53.7 %   MCV 90.5  80 - 97 fL   MCH, POC 28.9  27 - 31.2 pg   MCHC 31.9  31.8 - 35.4 g/dL   RDW, POC 04.5     Platelet Count, POC 425 (*) 142 - 424 K/uL   MPV 9.5  0 - 99.8 fL  POCT UA - MICROSCOPIC ONLY      Component Value Range   WBC, Ur, HPF, POC 2-5     RBC, urine, microscopic 0-2     Bacteria, U Microscopic neg     Mucus, UA moderate     Epithelial cells, urine per micros neg     Crystals, Ur, HPF, POC neg     Casts, Ur, LPF, POC neg     Yeast, UA neg    POCT URINALYSIS DIPSTICK      Component Value Range   Color, UA amber     Clarity, UA clear     Glucose, UA neg     Bilirubin, UA small     Ketones, UA trace     Spec Grav, UA 1.015     Blood, UA neg     pH, UA 6.0     Protein, UA 30mg /dL     Urobilinogen, UA 1.0     Nitrite, UA neg     Leukocytes, UA Trace     [facility] reading (PRIMARY) by  Dr. Katrinka Blazing.  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  R knee: NAD.   Assessment & Plan:   1. Fever  DG Cervical Spine Complete, DG Knee 1-2  Views Right, POCT CBC, POCT SEDIMENTATION RATE, POCT UA - Microscopic Only, POCT urinalysis dipstick, Comprehensive metabolic panel, ANA, B. burgdorfi antibodies, Rheumatoid factor, Rocky mtn spotted fvr ab, IgM-blood  2. Myalgia  DG Cervical Spine Complete, DG Knee 1-2 Views Right, POCT CBC, POCT SEDIMENTATION RATE, POCT UA - Microscopic Only, POCT urinalysis dipstick, Comprehensive metabolic panel, ANA, B. burgdorfi antibodies, Rheumatoid factor, Rocky mtn spotted fvr ab, IgM-blood  3.  Arthralgia  DG Cervical Spine Complete, DG Knee 1-2 Views Right, POCT CBC, POCT SEDIMENTATION RATE, POCT UA - Microscopic Only, POCT urinalysis dipstick, Comprehensive metabolic panel, ANA, B. burgdorfi antibodies, Rheumatoid factor, Rocky mtn spotted fvr ab, IgM-blood  4. Knee pain  DG Cervical Spine Complete, DG Knee 1-2 Views Right, POCT CBC, POCT SEDIMENTATION RATE, POCT UA - Microscopic Only, POCT urinalysis dipstick, Comprehensive metabolic panel, ANA, B. burgdorfi antibodies, Rheumatoid factor, Rocky mtn spotted fvr ab, IgM-blood  5. Neck pain  DG Cervical Spine Complete, DG Knee 1-2 Views Right, POCT CBC, POCT SEDIMENTATION RATE, POCT UA - Microscopic Only, POCT urinalysis dipstick, Comprehensive metabolic panel, ANA, B. burgdorfi antibodies, Rheumatoid factor, Rocky mtn spotted fvr ab, IgM-blood  6. Ulcerative colitis  DG Cervical Spine Complete, DG Knee 1-2 Views Right, POCT CBC, POCT SEDIMENTATION RATE, POCT UA - Microscopic Only, POCT urinalysis dipstick, Comprehensive metabolic panel, ANA, B. burgdorfi antibodies, Rheumatoid factor, Rocky mtn spotted fvr ab, IgM-blood       1. Fever: Improving; afebrile for past 24 hours.  Onset of fever approximately nine days ago; duration of fever eight days total.  WBC improved from visit three days ago.  Obtain RMSF, ANA, RF. 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  Associated with arthralgias so autoimmune process considered in ddx.  Non-toxic in appearance.  Close follow-up. 2.  Arthralgias:  Persistent despite improvement in fever in past 24 hours.  Obtain ESR, RF, ANA.  Refill of hydrocodone provided.  OOW note provided. 3.  Neck pain:  New.  Associated with fever, malaise. No associated meningitis symptoms (no nuchal rigidity, confusion, photophobia, altered mental status).   C-spine film with DDD.  Monitor closely; if worsens or persists, will warrant MRI cervical spine to evaluate for diskitis, other pathology. 4. Knee pain B: New.  Associated  with fever, neck pain.  No associated erythema, effusion, swelling.  Rx for hydrocodone provided. 5. Ulcerative Colitis:  Stable; no active UC symptoms.

## 2011-12-08 ENCOUNTER — Encounter: Payer: Self-pay | Admitting: Family Medicine

## 2011-12-09 ENCOUNTER — Telehealth: Payer: Self-pay | Admitting: Family Medicine

## 2011-12-09 DIAGNOSIS — E291 Testicular hypofunction: Secondary | ICD-10-CM

## 2011-12-09 LAB — ROCKY MTN SPOTTED FVR AB, IGM-BLOOD: ROCKY MTN SPOTTED FEVER, IGM: 0.21 IV

## 2011-12-09 LAB — ANA: Anti Nuclear Antibody(ANA): NEGATIVE

## 2011-12-09 NOTE — Telephone Encounter (Signed)
Called Pt to check to see how he is doing- phone # disconnected. Mailing letter and putting a future order in epic for Pt to have PSA rechecked in 3 weeks. Eileen Stanford

## 2011-12-10 LAB — B. BURGDORFI ANTIBODIES: B burgdorferi Ab IgG+IgM: 0.5 {ISR}

## 2011-12-11 ENCOUNTER — Ambulatory Visit (INDEPENDENT_AMBULATORY_CARE_PROVIDER_SITE_OTHER): Payer: BC Managed Care – PPO | Admitting: Emergency Medicine

## 2011-12-11 VITALS — BP 115/77 | HR 81 | Temp 98.7°F | Resp 18 | Wt 177.0 lb

## 2011-12-11 DIAGNOSIS — K529 Noninfective gastroenteritis and colitis, unspecified: Secondary | ICD-10-CM

## 2011-12-11 DIAGNOSIS — M791 Myalgia, unspecified site: Secondary | ICD-10-CM

## 2011-12-11 DIAGNOSIS — E291 Testicular hypofunction: Secondary | ICD-10-CM

## 2011-12-11 DIAGNOSIS — R109 Unspecified abdominal pain: Secondary | ICD-10-CM

## 2011-12-11 DIAGNOSIS — IMO0001 Reserved for inherently not codable concepts without codable children: Secondary | ICD-10-CM

## 2011-12-11 LAB — POCT CBC
Granulocyte percent: 61.1 %G (ref 37–80)
HCT, POC: 45.6 % (ref 43.5–53.7)
Hemoglobin: 13.9 g/dL — AB (ref 14.1–18.1)
Lymph, poc: 2.8 (ref 0.6–3.4)
MCH, POC: 28 pg (ref 27–31.2)
MCHC: 30.5 g/dL — AB (ref 31.8–35.4)
MCV: 91.7 fL (ref 80–97)
MID (cbc): 0.6 (ref 0–0.9)
MPV: 8.7 fL (ref 0–99.8)
POC Granulocyte: 5.3 (ref 2–6.9)
POC LYMPH PERCENT: 32.1 %L (ref 10–50)
POC MID %: 6.8 %M (ref 0–12)
Platelet Count, POC: 564 10*3/uL — AB (ref 142–424)
RBC: 4.97 M/uL (ref 4.69–6.13)
RDW, POC: 14 %
WBC: 8.6 10*3/uL (ref 4.6–10.2)

## 2011-12-11 LAB — POCT URINALYSIS DIPSTICK
Bilirubin, UA: NEGATIVE
Blood, UA: NEGATIVE
Glucose, UA: NEGATIVE
Ketones, UA: NEGATIVE
Leukocytes, UA: NEGATIVE
Nitrite, UA: NEGATIVE
Protein, UA: NEGATIVE
Spec Grav, UA: <=1.005
Urobilinogen, UA: 0.2
pH, UA: 6

## 2011-12-11 LAB — IFOBT (OCCULT BLOOD): IFOBT: NEGATIVE

## 2011-12-11 LAB — CK: Total CK: 56 U/L (ref 7–232)

## 2011-12-11 LAB — POCT SEDIMENTATION RATE: POCT SED RATE: 55 mm/hr — AB (ref 0–22)

## 2011-12-11 LAB — POCT UA - MICROSCOPIC ONLY
Bacteria, U Microscopic: NEGATIVE
Casts, Ur, LPF, POC: NEGATIVE
Crystals, Ur, HPF, POC: NEGATIVE
Mucus, UA: NEGATIVE
RBC, urine, microscopic: NEGATIVE
WBC, Ur, HPF, POC: NEGATIVE
Yeast, UA: NEGATIVE

## 2011-12-11 LAB — COMPREHENSIVE METABOLIC PANEL
ALT: 119 U/L — ABNORMAL HIGH (ref 0–53)
AST: 90 U/L — ABNORMAL HIGH (ref 0–37)
Albumin: 4 g/dL (ref 3.5–5.2)
Alkaline Phosphatase: 265 U/L — ABNORMAL HIGH (ref 39–117)
BUN: 11 mg/dL (ref 6–23)
CO2: 26 mEq/L (ref 19–32)
Calcium: 9.6 mg/dL (ref 8.4–10.5)
Chloride: 103 mEq/L (ref 96–112)
Creat: 0.94 mg/dL (ref 0.50–1.35)
Glucose, Bld: 94 mg/dL (ref 70–99)
Potassium: 4.8 mEq/L (ref 3.5–5.3)
Sodium: 138 mEq/L (ref 135–145)
Total Bilirubin: 0.6 mg/dL (ref 0.3–1.2)
Total Protein: 7.5 g/dL (ref 6.0–8.3)

## 2011-12-11 LAB — C-REACTIVE PROTEIN: CRP: 5.6 mg/dL — ABNORMAL HIGH (ref <0.60)

## 2011-12-11 MED ORDER — TESTOSTERONE CYPIONATE 200 MG/ML IM SOLN
200.0000 mg | Freq: Once | INTRAMUSCULAR | Status: AC
  Administered 2011-12-11: 200 mg via INTRAMUSCULAR

## 2011-12-11 NOTE — Progress Notes (Signed)
Testosterone injection 

## 2011-12-11 NOTE — Progress Notes (Signed)
  Subjective:    Patient ID: Marcus Gutierrez, male    DOB: Nov 15, 1960, 51 y.o.   MRN: [id number]  HPI  Follow up from Saturday with Dr. Katrinka Blazing.  Spot on left eye, fever, .  Seen Dr. Patsy Lager on Wednesday.  Feeling  A little better but still having pain and body aches.  Also would like to have a prostate exam.  Would like to discuss ulcerative colitis.  Urine has looked like tea and did not get that way until he started lialda.  Being able to bend over and pick things up is a task for him. Initially his white count and sedimentation rate were elevated. On repeat testing on Saturday his white count had returned to normal but his sedimentation rate remains elevated. Repeat LFTs reveal mild increase in his alkaline phosphatase and his AST. He continues on doxycycline but no other medications at present. He is aching and knowledge joints but worse in his knees. He states he aches when he tries to do any ambulation or going up steps. He has not had any fever or chills. He has not had a sore throat or productive cough. He does not feel like he has had an exacerbation of his known ulcerative colitis    Review of Systems     Objective:   Physical Exam patient looks like he does not feel well but does not look toxic. His neck is supple. Chest is clear to auscultation and percussion. Heart is regular rate without murmurs rubs or gallops. The abdomen is flat liver and spleen not large no areas of tenderness or masses palpable. Examination of the knees does not reveal any joint swelling.        Assessment & Plan:  Patient hasn't comes compatible with a flulike illness versus a form of connective tissue disease he does have ulcerative colitis and certainly this could be tied into that disorder. Since his LFTs were increasing and went ahead and checked EBV CMV and hepatitis panel on him. Because of his complaints of arthralgias and myalgias and a CPK. His sedimentation rate has been elevated in the 60s and I have  repeated that. His white count today was normal and 6 clinically the patient states he is beginning to feel somewhat better. His titers for Lyme disease and rocky mountain spotted fever were both negative. I plan to see him back in a week. I left him out of work. As instructed the

## 2011-12-11 NOTE — Progress Notes (Deleted)
  Subjective:    Patient ID: Marcus Gutierrez, male    DOB: 07/12/1960, 51 y.o.   MRN: [id number]  HPI    Review of Systems     Objective:   Physical Exam        Assessment & Plan:   

## 2011-12-12 LAB — PSA: PSA: 2.09 ng/mL (ref <=4.00)

## 2011-12-12 LAB — HEPATITIS PANEL, ACUTE
HCV Ab: NEGATIVE
Hep A IgM: NEGATIVE
Hep B C IgM: NEGATIVE
Hepatitis B Surface Ag: NEGATIVE

## 2011-12-12 LAB — CMV IGM: CMV IgM: 0.15 (ref <0.90)

## 2011-12-12 LAB — EPSTEIN-BARR VIRUS VCA ANTIBODY PANEL
EBV EA IgG: <5 U/mL (ref <9.0)
EBV NA IgG: 475 U/mL — ABNORMAL HIGH (ref <18.0)
EBV VCA IgG: 263 U/mL — ABNORMAL HIGH (ref <18.0)
EBV VCA IgM: <10 U/mL (ref <36.0)

## 2011-12-17 ENCOUNTER — Telehealth: Payer: Self-pay | Admitting: Family Medicine

## 2011-12-17 NOTE — Telephone Encounter (Signed)
Called but he was not at home and cell phone number did not work.

## 2011-12-17 NOTE — Telephone Encounter (Signed)
He later did call back- he has an appt tomorrow to see Dr. Cleta Alberts.  Let him know I was just checking on him

## 2011-12-18 ENCOUNTER — Ambulatory Visit (INDEPENDENT_AMBULATORY_CARE_PROVIDER_SITE_OTHER): Payer: BC Managed Care – PPO | Admitting: Emergency Medicine

## 2011-12-18 VITALS — BP 109/74 | HR 58 | Temp 98.2°F | Resp 18 | Ht 70.0 in | Wt 173.0 lb

## 2011-12-18 DIAGNOSIS — R7989 Other specified abnormal findings of blood chemistry: Secondary | ICD-10-CM

## 2011-12-18 DIAGNOSIS — M791 Myalgia, unspecified site: Secondary | ICD-10-CM

## 2011-12-18 DIAGNOSIS — R109 Unspecified abdominal pain: Secondary | ICD-10-CM

## 2011-12-18 DIAGNOSIS — R972 Elevated prostate specific antigen [PSA]: Secondary | ICD-10-CM

## 2011-12-18 LAB — POCT UA - MICROSCOPIC ONLY
Bacteria, U Microscopic: NEGATIVE
Casts, Ur, LPF, POC: NEGATIVE
Crystals, Ur, HPF, POC: NEGATIVE
Mucus, UA: NEGATIVE
Yeast, UA: NEGATIVE

## 2011-12-18 LAB — POCT URINALYSIS DIPSTICK
Bilirubin, UA: NEGATIVE
Blood, UA: NEGATIVE
Glucose, UA: NEGATIVE
Leukocytes, UA: NEGATIVE
Nitrite, UA: NEGATIVE
Spec Grav, UA: 1.025
Urobilinogen, UA: 0.2
pH, UA: 6

## 2011-12-18 LAB — POCT CBC
Granulocyte percent: 51.1 %G (ref 37–80)
HCT, POC: 48.4 % (ref 43.5–53.7)
Hemoglobin: 14.8 g/dL (ref 14.1–18.1)
Lymph, poc: 3.7 — AB (ref 0.6–3.4)
MCH, POC: 27.9 pg (ref 27–31.2)
MCHC: 30.6 g/dL — AB (ref 31.8–35.4)
MCV: 91.3 fL (ref 80–97)
MID (cbc): 0.7 (ref 0–0.9)
MPV: 9.2 fL (ref 0–99.8)
POC Granulocyte: 4.5 (ref 2–6.9)
POC LYMPH PERCENT: 41.5 %L (ref 10–50)
POC MID %: 7.4 %M (ref 0–12)
Platelet Count, POC: 597 10*3/uL — AB (ref 142–424)
RBC: 5.3 M/uL (ref 4.69–6.13)
RDW, POC: 14.6 %
WBC: 8.9 10*3/uL (ref 4.6–10.2)

## 2011-12-18 LAB — COMPREHENSIVE METABOLIC PANEL
ALT: 85 U/L — ABNORMAL HIGH (ref 0–53)
AST: 57 U/L — ABNORMAL HIGH (ref 0–37)
Albumin: 3.9 g/dL (ref 3.5–5.2)
Alkaline Phosphatase: 214 U/L — ABNORMAL HIGH (ref 39–117)
BUN: 15 mg/dL (ref 6–23)
CO2: 28 mEq/L (ref 19–32)
Calcium: 9.4 mg/dL (ref 8.4–10.5)
Chloride: 104 mEq/L (ref 96–112)
Creat: 0.97 mg/dL (ref 0.50–1.35)
Glucose, Bld: 90 mg/dL (ref 70–99)
Potassium: 4.6 mEq/L (ref 3.5–5.3)
Sodium: 140 mEq/L (ref 135–145)
Total Bilirubin: 0.5 mg/dL (ref 0.3–1.2)
Total Protein: 7.4 g/dL (ref 6.0–8.3)

## 2011-12-18 LAB — POCT SEDIMENTATION RATE: POCT SED RATE: 28 mm/hr — AB (ref 0–22)

## 2011-12-18 NOTE — Progress Notes (Signed)
  Subjective:    Patient ID: Marcus Gutierrez, male    DOB: 06-Jul-1960, 51 y.o.   MRN: [id number]  HPI patient in for recheck of his elevated liver function tests and flulike illness. He has had elevated sedimentation rate and CRP. He was placed on doxycycline. He has felt significantly better. His drinking fluids well. He is now a repeat without difficulty. Also noted is on testosterone replacement and has had a rising PSA but still in the normal range    Review of Systems     Objective:   Physical Exam HEENT exam is unremarkable. His neck is supple. His chest is clear to auscultation and percussion cardiac exam unremarkable. Abdominal exam reveals a tenderness in the right upper abdomen and midepigastrium without rebound.        Results for orders placed in visit on 12/18/11  POCT CBC      Component Value Range   WBC 8.9  4.6 - 10.2 K/uL   Lymph, poc 3.7 (*) 0.6 - 3.4   POC LYMPH PERCENT 41.5  10 - 50 %L   MID (cbc) 0.7  0 - 0.9   POC MID % 7.4  0 - 12 %M   POC Granulocyte 4.5  2 - 6.9   Granulocyte percent 51.1  37 - 80 %G   RBC 5.30  4.69 - 6.13 M/uL   Hemoglobin 14.8  14.1 - 18.1 g/dL   HCT, POC 48.4  43.5 - 53.7 %   MCV 91.3  80 - 97 fL   MCH, POC 27.9  27 - 31.2 pg   MCHC 30.6 (*) 31.8 - 35.4 g/dL   RDW, POC 14.6     Platelet Count, POC 597 (*) 142 - 424 K/uL   MPV 9.2  0 - 99.8 fL  POCT UA - MICROSCOPIC ONLY      Component Value Range   WBC, Ur, HPF, POC 0-1     RBC, urine, microscopic 0-2     Bacteria, U Microscopic neg     Mucus, UA neg     Epithelial cells, urine per micros 0-1     Crystals, Ur, HPF, POC neg     Casts, Ur, LPF, POC neg     Yeast, UA neg    POCT URINALYSIS DIPSTICK      Component Value Range   Color, UA amber     Clarity, UA clear     Glucose, UA neg     Bilirubin, UA neg     Ketones, UA trace     Spec Grav, UA 1.025     Blood, UA neg     pH, UA 6.0     Protein, UA trace     Urobilinogen, UA 0.2     Nitrite, UA neg     Leukocytes, UA  Negative     Assessment & Plan:  Patient being referred for evaluation of rising PSA while he is on testosterone. We'll check an ultrasound of his upper abdomen because he is tender there and has elevated LFTs. His illness seems to be resolving question whether this could be related to his ulcerative colitis.

## 2011-12-27 ENCOUNTER — Ambulatory Visit (INDEPENDENT_AMBULATORY_CARE_PROVIDER_SITE_OTHER): Payer: BC Managed Care – PPO | Admitting: *Deleted

## 2011-12-27 ENCOUNTER — Ambulatory Visit: Payer: BC Managed Care – PPO

## 2011-12-27 DIAGNOSIS — E291 Testicular hypofunction: Secondary | ICD-10-CM

## 2011-12-27 MED ORDER — TESTOSTERONE CYPIONATE 200 MG/ML IM SOLN
200.0000 mg | Freq: Once | INTRAMUSCULAR | Status: AC
  Administered 2011-12-27: 200 mg via INTRAMUSCULAR

## 2012-01-17 ENCOUNTER — Ambulatory Visit
Admission: RE | Admit: 2012-01-17 | Discharge: 2012-01-17 | Disposition: A | Discharge status: Home / Self Care | Payer: BC Managed Care – PPO | Source: Ambulatory Visit | Attending: Emergency Medicine | Admitting: Emergency Medicine

## 2012-01-17 DIAGNOSIS — R7989 Other specified abnormal findings of blood chemistry: Secondary | ICD-10-CM

## 2012-01-17 DIAGNOSIS — R109 Unspecified abdominal pain: Secondary | ICD-10-CM

## 2012-01-27 ENCOUNTER — Ambulatory Visit (INDEPENDENT_AMBULATORY_CARE_PROVIDER_SITE_OTHER): Payer: BC Managed Care – PPO | Admitting: Physician Assistant

## 2012-01-27 DIAGNOSIS — E291 Testicular hypofunction: Secondary | ICD-10-CM

## 2012-01-27 DIAGNOSIS — E236 Other disorders of pituitary gland: Secondary | ICD-10-CM

## 2012-01-27 MED ORDER — TESTOSTERONE CYPIONATE 200 MG/ML IM SOLN
200.0000 mg | Freq: Once | INTRAMUSCULAR | Status: AC
  Administered 2012-01-27: 200 mg via INTRAMUSCULAR

## 2012-01-27 NOTE — Progress Notes (Signed)
Patient here for testosterone injection. Not seen by a provider. Ok to give.

## 2012-02-17 ENCOUNTER — Telehealth: Payer: Self-pay

## 2012-02-17 NOTE — Telephone Encounter (Signed)
PT PLANS TO COME IN AND SEE DAUB ON Thursday OR Friday.  HE ALSO NEEDS TO SEE SHEKETIA FOR HIS SHOT.  WANTS [place] TO PLEASE CALL HIM BACK WHEN SHE WILL BE HERE, PREFERABLY THE DAYS DAUB IS HERE.  CALL WORK NUMBER AT [phone].  IF WE CALL BACK THIS EVENING CALL [phone]

## 2012-02-18 NOTE — Telephone Encounter (Signed)
lmom at both numbers that I will be in office on Thursday 730-1 and Dr. Cleta Alberts will be her on 745-4.

## 2012-02-20 ENCOUNTER — Ambulatory Visit (INDEPENDENT_AMBULATORY_CARE_PROVIDER_SITE_OTHER): Payer: BC Managed Care – PPO | Admitting: Emergency Medicine

## 2012-02-20 ENCOUNTER — Ambulatory Visit: Payer: BC Managed Care – PPO

## 2012-02-20 VITALS — BP 122/79 | HR 89 | Temp 98.7°F | Resp 16 | Ht 69.0 in | Wt 171.0 lb

## 2012-02-20 DIAGNOSIS — R7989 Other specified abnormal findings of blood chemistry: Secondary | ICD-10-CM

## 2012-02-20 DIAGNOSIS — R945 Abnormal results of liver function studies: Secondary | ICD-10-CM

## 2012-02-20 DIAGNOSIS — R972 Elevated prostate specific antigen [PSA]: Secondary | ICD-10-CM

## 2012-02-20 DIAGNOSIS — E291 Testicular hypofunction: Secondary | ICD-10-CM

## 2012-02-20 DIAGNOSIS — M542 Cervicalgia: Secondary | ICD-10-CM

## 2012-02-20 DIAGNOSIS — R700 Elevated erythrocyte sedimentation rate: Secondary | ICD-10-CM

## 2012-02-20 LAB — COMPREHENSIVE METABOLIC PANEL
ALT: 8 U/L (ref 0–53)
AST: 15 U/L (ref 0–37)
Albumin: 4.5 g/dL (ref 3.5–5.2)
Alkaline Phosphatase: 79 U/L (ref 39–117)
BUN: 13 mg/dL (ref 6–23)
CO2: 28 mEq/L (ref 19–32)
Calcium: 9.4 mg/dL (ref 8.4–10.5)
Chloride: 103 mEq/L (ref 96–112)
Creat: 1.08 mg/dL (ref 0.50–1.35)
Glucose, Bld: 103 mg/dL — ABNORMAL HIGH (ref 70–99)
Potassium: 4.2 mEq/L (ref 3.5–5.3)
Sodium: 138 mEq/L (ref 135–145)
Total Bilirubin: 0.5 mg/dL (ref 0.3–1.2)
Total Protein: 7.7 g/dL (ref 6.0–8.3)

## 2012-02-20 LAB — POCT CBC
Granulocyte percent: 54.3 %G (ref 37–80)
HCT, POC: 47.5 % (ref 43.5–53.7)
Hemoglobin: 15.2 g/dL (ref 14.1–18.1)
Lymph, poc: 2.7 (ref 0.6–3.4)
MCH, POC: 28.7 pg (ref 27–31.2)
MCHC: 32 g/dL (ref 31.8–35.4)
MCV: 89.6 fL (ref 80–97)
MID (cbc): 0.5 (ref 0–0.9)
MPV: 10.6 fL (ref 0–99.8)
POC Granulocyte: 3.8 (ref 2–6.9)
POC LYMPH PERCENT: 38.3 %L (ref 10–50)
POC MID %: 7.4 %M (ref 0–12)
Platelet Count, POC: 364 10*3/uL (ref 142–424)
RBC: 5.3 M/uL (ref 4.69–6.13)
RDW, POC: 14.9 %
WBC: 7 10*3/uL (ref 4.6–10.2)

## 2012-02-20 LAB — POCT SEDIMENTATION RATE: POCT SED RATE: 16 mm/hr (ref 0–22)

## 2012-02-20 LAB — PSA: PSA: 0.66 ng/mL (ref <=4.00)

## 2012-02-20 MED ORDER — TESTOSTERONE CYPIONATE 200 MG/ML IM SOLN
200.0000 mg | Freq: Once | INTRAMUSCULAR | Status: AC
  Administered 2012-02-20: 200 mg via INTRAMUSCULAR

## 2012-02-20 NOTE — Progress Notes (Signed)
  Subjective:    Patient ID: Marcus Gutierrez, male    DOB: January 08, 1961, 52 y.o.   MRN: [id number]  HPI Patient enters for followup of elevated PSA. He also has had some pain in the left side of his neck and down the left arm. He denies any trauma to the neck. He denies any weakness of his left arm. He is no previous history of neck discomfort patient also here to get his shot of testosterone he gets every 2 weeks. He also is going to [facility] for GI evaluation and is planning to have an endoscopy done t. He was scheduled to come in and have a repeat PSA done but he did not come for that  Review of Systems     Objective:   Physical Exam there is tenderness left supracervical area left ear he cervical area there is pain with twisting of the neck. Deep tendon reflexes of the upper extremities are 2+. Motor strength is 5 out of 5. 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 Results for orders placed in visit on 02/20/12  POCT CBC      Component Value Range   WBC 7.0  4.6 - 10.2 K/uL   Lymph, poc 2.7  0.6 - 3.4   POC LYMPH PERCENT 38.3  10 - 50 %L   MID (cbc) 0.5  0 - 0.9   POC MID % 7.4  0 - 12 %M   POC Granulocyte 3.8  2 - 6.9   Granulocyte percent 54.3  37 - 80 %G   RBC 5.30  4.69 - 6.13 M/uL   Hemoglobin 15.2  14.1 - 18.1 g/dL   HCT, POC 40.9  81.1 - 53.7 %   MCV 89.6  80 - 97 fL   MCH, POC 28.7  27 - 31.2 pg   MCHC 32.0  31.8 - 35.4 g/dL   RDW, POC 91.4     Platelet Count, POC 364  142 - 424 K/uL   MPV 10.6  0 - 99.8 fL         Assessment & Plan:  Will check routine labs as well as C-spine films. He was advised to take his Aleve for neck pain. I did repeat his liver function tests that were elevated previously as was his sedimentation rate. Followup dependent on what the level of this is. I would advise he get further evaluation of his neck with one of the specialists.

## 2012-02-26 ENCOUNTER — Encounter: Payer: Self-pay | Admitting: *Deleted

## 2012-03-13 ENCOUNTER — Ambulatory Visit (INDEPENDENT_AMBULATORY_CARE_PROVIDER_SITE_OTHER): Payer: BC Managed Care – PPO | Admitting: *Deleted

## 2012-03-13 DIAGNOSIS — E291 Testicular hypofunction: Secondary | ICD-10-CM

## 2012-03-13 MED ORDER — TESTOSTERONE CYPIONATE 200 MG/ML IM SOLN
200.0000 mg | Freq: Once | INTRAMUSCULAR | Status: AC
  Administered 2012-03-13: 200 mg via INTRAMUSCULAR

## 2012-03-28 ENCOUNTER — Other Ambulatory Visit: Payer: Self-pay

## 2012-04-08 ENCOUNTER — Ambulatory Visit (INDEPENDENT_AMBULATORY_CARE_PROVIDER_SITE_OTHER): Payer: BC Managed Care – PPO | Admitting: *Deleted

## 2012-04-08 DIAGNOSIS — E291 Testicular hypofunction: Secondary | ICD-10-CM

## 2012-04-08 MED ORDER — TESTOSTERONE CYPIONATE 200 MG/ML IM SOLN
200.0000 mg | Freq: Once | INTRAMUSCULAR | Status: AC
  Administered 2012-04-08: 200 mg via INTRAMUSCULAR

## 2012-04-24 ENCOUNTER — Ambulatory Visit (INDEPENDENT_AMBULATORY_CARE_PROVIDER_SITE_OTHER): Payer: BC Managed Care – PPO | Admitting: *Deleted

## 2012-04-24 DIAGNOSIS — E291 Testicular hypofunction: Secondary | ICD-10-CM

## 2012-04-24 MED ORDER — TESTOSTERONE CYPIONATE 200 MG/ML IM SOLN
200.0000 mg | Freq: Once | INTRAMUSCULAR | Status: AC
  Administered 2012-04-24: 200 mg via INTRAMUSCULAR

## 2012-05-08 ENCOUNTER — Telehealth: Payer: Self-pay | Admitting: *Deleted

## 2012-05-08 ENCOUNTER — Ambulatory Visit (INDEPENDENT_AMBULATORY_CARE_PROVIDER_SITE_OTHER): Payer: BC Managed Care – PPO | Admitting: *Deleted

## 2012-05-08 DIAGNOSIS — E291 Testicular hypofunction: Secondary | ICD-10-CM

## 2012-05-08 MED ORDER — TESTOSTERONE CYPIONATE 200 MG/ML IM SOLN
200.0000 mg | Freq: Once | INTRAMUSCULAR | Status: AC
  Administered 2012-05-08: 200 mg via INTRAMUSCULAR

## 2012-05-08 NOTE — Telephone Encounter (Signed)
Pharmacy requesting refill on testoststerone cypionate

## 2012-05-09 MED ORDER — TESTOSTERONE CYPIONATE 200 MG/ML IM SOLN: 200.0000 mg | INTRAMUSCULAR | Status: DC

## 2012-05-09 NOTE — Telephone Encounter (Signed)
rx done and will send to pharmacy

## 2012-05-09 NOTE — Telephone Encounter (Signed)
Okay to refill medication for 3 months .

## 2012-05-11 ENCOUNTER — Other Ambulatory Visit: Payer: Self-pay | Admitting: Radiology

## 2012-05-11 NOTE — Telephone Encounter (Signed)
Faxed testosterone

## 2012-05-21 ENCOUNTER — Ambulatory Visit (INDEPENDENT_AMBULATORY_CARE_PROVIDER_SITE_OTHER): Payer: BC Managed Care – PPO | Admitting: Physician Assistant

## 2012-05-21 DIAGNOSIS — E291 Testicular hypofunction: Secondary | ICD-10-CM

## 2012-05-21 MED ORDER — TESTOSTERONE CYPIONATE 200 MG/ML IM SOLN
200.0000 mg | Freq: Once | INTRAMUSCULAR | Status: AC
  Administered 2012-05-21: 200 mg via INTRAMUSCULAR

## 2012-05-21 NOTE — Progress Notes (Signed)
Patient here for testosterone injection. Ok to give - not seen by a provider today.

## 2012-05-21 NOTE — Progress Notes (Deleted)
  Subjective:    Patient ID: Marcus Gutierrez, male    DOB: [birth year], 52 y.o.   MRN: [id number]  HPI    Review of Systems     Objective:   Physical Exam        Assessment & Plan:

## 2012-05-21 NOTE — Progress Notes (Signed)
Patient here for testosterone injection.  He has a standing order for this.  He is to return in two weeks for next injection

## 2012-06-04 ENCOUNTER — Ambulatory Visit (INDEPENDENT_AMBULATORY_CARE_PROVIDER_SITE_OTHER): Payer: BC Managed Care – PPO | Admitting: Physician Assistant

## 2012-06-04 DIAGNOSIS — E291 Testicular hypofunction: Secondary | ICD-10-CM

## 2012-06-04 MED ORDER — TESTOSTERONE CYPIONATE 200 MG/ML IM SOLN
200.0000 mg | Freq: Once | INTRAMUSCULAR | Status: AC
  Administered 2012-06-04: 200 mg via INTRAMUSCULAR

## 2012-06-04 NOTE — Progress Notes (Signed)
Patient came in for 2 week testosterone shot.

## 2012-06-10 ENCOUNTER — Telehealth: Payer: Self-pay | Admitting: Radiology

## 2012-06-10 NOTE — Telephone Encounter (Signed)
Patient needs labs/ CMET and PSA checked in July(per his insurance company) since he is on testosterone. Left message for him to advise to call and make appt with Dr Cleta Alberts in July for this.

## 2012-06-19 ENCOUNTER — Ambulatory Visit (INDEPENDENT_AMBULATORY_CARE_PROVIDER_SITE_OTHER): Payer: BC Managed Care – PPO | Admitting: *Deleted

## 2012-06-19 DIAGNOSIS — E291 Testicular hypofunction: Secondary | ICD-10-CM

## 2012-06-19 MED ORDER — TESTOSTERONE CYPIONATE 200 MG/ML IM SOLN
200.0000 mg | Freq: Once | INTRAMUSCULAR | Status: AC
  Administered 2012-06-19: 200 mg via INTRAMUSCULAR

## 2012-07-07 ENCOUNTER — Ambulatory Visit (INDEPENDENT_AMBULATORY_CARE_PROVIDER_SITE_OTHER): Payer: BC Managed Care – PPO | Admitting: *Deleted

## 2012-07-07 DIAGNOSIS — E291 Testicular hypofunction: Secondary | ICD-10-CM

## 2012-07-07 MED ORDER — TESTOSTERONE CYPIONATE 200 MG/ML IM SOLN
200.0000 mg | Freq: Once | INTRAMUSCULAR | Status: AC
  Administered 2012-07-07: 200 mg via INTRAMUSCULAR

## 2012-07-22 ENCOUNTER — Ambulatory Visit (INDEPENDENT_AMBULATORY_CARE_PROVIDER_SITE_OTHER): Payer: BC Managed Care – PPO | Admitting: *Deleted

## 2012-07-22 DIAGNOSIS — E291 Testicular hypofunction: Secondary | ICD-10-CM

## 2012-07-22 MED ORDER — TESTOSTERONE CYPIONATE 200 MG/ML IM SOLN
200.0000 mg | Freq: Once | INTRAMUSCULAR | Status: AC
  Administered 2012-07-22: 200 mg via INTRAMUSCULAR

## 2012-08-05 ENCOUNTER — Ambulatory Visit (INDEPENDENT_AMBULATORY_CARE_PROVIDER_SITE_OTHER): Payer: BC Managed Care – PPO | Admitting: *Deleted

## 2012-08-05 DIAGNOSIS — E291 Testicular hypofunction: Secondary | ICD-10-CM

## 2012-08-05 MED ORDER — TESTOSTERONE CYPIONATE 200 MG/ML IM SOLN
200.0000 mg | Freq: Once | INTRAMUSCULAR | Status: AC
  Administered 2012-08-05: 200 mg via INTRAMUSCULAR

## 2012-08-21 ENCOUNTER — Ambulatory Visit (INDEPENDENT_AMBULATORY_CARE_PROVIDER_SITE_OTHER): Payer: BC Managed Care – PPO | Admitting: *Deleted

## 2012-08-21 DIAGNOSIS — E291 Testicular hypofunction: Secondary | ICD-10-CM

## 2012-08-21 MED ORDER — TESTOSTERONE CYPIONATE 200 MG/ML IM SOLN
200.0000 mg | Freq: Once | INTRAMUSCULAR | Status: AC
  Administered 2012-08-21: 200 mg via INTRAMUSCULAR

## 2012-09-09 ENCOUNTER — Ambulatory Visit (INDEPENDENT_AMBULATORY_CARE_PROVIDER_SITE_OTHER): Payer: BC Managed Care – PPO | Admitting: Emergency Medicine

## 2012-09-09 VITALS — BP 118/86 | HR 85 | Temp 97.7°F | Resp 16 | Ht 69.0 in | Wt 171.4 lb

## 2012-09-09 DIAGNOSIS — E291 Testicular hypofunction: Secondary | ICD-10-CM

## 2012-09-09 DIAGNOSIS — Z79899 Other long term (current) drug therapy: Secondary | ICD-10-CM

## 2012-09-09 LAB — PSA: PSA: 0.81 ng/mL (ref <=4.00)

## 2012-09-09 MED ORDER — TESTOSTERONE CYPIONATE 200 MG/ML IM SOLN: 200.0000 mg | INTRAMUSCULAR | Status: DC

## 2012-09-09 MED ORDER — TESTOSTERONE CYPIONATE 200 MG/ML IM SOLN
200.0000 mg | Freq: Once | INTRAMUSCULAR | Status: AC
  Administered 2012-09-09: 200 mg via INTRAMUSCULAR

## 2012-09-09 NOTE — Progress Notes (Signed)
  Subjective:    Patient ID: Marcus Gutierrez, male    DOB: 1960/04/22, 52 y.o.   MRN: [id number]  HPI patient enters for followup on his testosterone. He states the testosterone gives him extra energy he feels great on it. He also has concerns of possibly they will not refill his Nexium he is on a high dose at 40 mg a day. He has gone through a thorough valuation and found to have ulcer disease through the GI specialist at [place]     Objective:   Physical Exam H. EENT exam is unremarkable neck supple chest clear heart regular rate no murmurs abdomen soft nontender.        Assessment & Plan:  Testosterone is refilled today he gets 200 mg every 2 weeks PSA was done today along with testosterone levels. He will schedule a physical in the next 3-4 months.

## 2012-09-10 LAB — TESTOSTERONE, FREE, TOTAL, SHBG
Sex Hormone Binding: 24 nmol/L (ref 13–71)
Testosterone, Free: 54 pg/mL (ref 47.0–244.0)
Testosterone-% Free: 2.3 % (ref 1.6–2.9)
Testosterone: 236 ng/dL — ABNORMAL LOW (ref 300–890)

## 2012-09-24 ENCOUNTER — Ambulatory Visit (INDEPENDENT_AMBULATORY_CARE_PROVIDER_SITE_OTHER): Payer: BC Managed Care – PPO | Admitting: *Deleted

## 2012-09-24 DIAGNOSIS — E291 Testicular hypofunction: Secondary | ICD-10-CM

## 2012-09-24 MED ORDER — TESTOSTERONE CYPIONATE 200 MG/ML IM SOLN
200.0000 mg | Freq: Once | INTRAMUSCULAR | Status: AC
  Administered 2012-09-24: 200 mg via INTRAMUSCULAR

## 2012-10-08 ENCOUNTER — Telehealth: Payer: Self-pay | Admitting: *Deleted

## 2012-10-08 ENCOUNTER — Ambulatory Visit (INDEPENDENT_AMBULATORY_CARE_PROVIDER_SITE_OTHER): Payer: BC Managed Care – PPO | Admitting: *Deleted

## 2012-10-08 DIAGNOSIS — E291 Testicular hypofunction: Secondary | ICD-10-CM

## 2012-10-08 MED ORDER — TESTOSTERONE CYPIONATE 200 MG/ML IM SOLN
200.0000 mg | Freq: Once | INTRAMUSCULAR | Status: AC
  Administered 2012-10-08: 200 mg via INTRAMUSCULAR

## 2012-10-08 NOTE — Telephone Encounter (Signed)
Can you please schedule pt for CPE with Dr. Everlene Farrier late November per Dr. Everlene Farrier last [place].

## 2012-10-14 NOTE — Telephone Encounter (Signed)
Left message to schedule appointment for a CPE with Dr Everlene Farrier for late November.

## 2012-10-23 ENCOUNTER — Ambulatory Visit (INDEPENDENT_AMBULATORY_CARE_PROVIDER_SITE_OTHER): Payer: BC Managed Care – PPO | Admitting: *Deleted

## 2012-10-23 VITALS — BP 118/86 | HR 104 | Temp 98.4°F

## 2012-10-23 DIAGNOSIS — E291 Testicular hypofunction: Secondary | ICD-10-CM

## 2012-10-23 MED ORDER — TESTOSTERONE CYPIONATE 200 MG/ML IM SOLN
200.0000 mg | Freq: Once | INTRAMUSCULAR | Status: AC
  Administered 2012-10-23: 200 mg via INTRAMUSCULAR

## 2012-11-22 ENCOUNTER — Ambulatory Visit (INDEPENDENT_AMBULATORY_CARE_PROVIDER_SITE_OTHER): Payer: BC Managed Care – PPO | Admitting: *Deleted

## 2012-11-22 DIAGNOSIS — E291 Testicular hypofunction: Secondary | ICD-10-CM

## 2012-11-22 MED ORDER — TESTOSTERONE CYPIONATE 200 MG/ML IM SOLN
200.0000 mg | Freq: Once | INTRAMUSCULAR | Status: AC
  Administered 2012-11-22: 200 mg via INTRAMUSCULAR

## 2012-12-10 ENCOUNTER — Ambulatory Visit (INDEPENDENT_AMBULATORY_CARE_PROVIDER_SITE_OTHER): Payer: BC Managed Care – PPO | Admitting: *Deleted

## 2012-12-10 VITALS — BP 106/76 | HR 101 | Temp 99.5°F | Resp 16

## 2012-12-10 DIAGNOSIS — E291 Testicular hypofunction: Secondary | ICD-10-CM

## 2012-12-10 MED ORDER — TESTOSTERONE CYPIONATE 200 MG/ML IM SOLN
200.0000 mg | Freq: Once | INTRAMUSCULAR | Status: AC
  Administered 2012-12-10: 200 mg via INTRAMUSCULAR

## 2012-12-17 ENCOUNTER — Other Ambulatory Visit: Payer: Self-pay

## 2012-12-25 ENCOUNTER — Ambulatory Visit (INDEPENDENT_AMBULATORY_CARE_PROVIDER_SITE_OTHER): Payer: BC Managed Care – PPO | Admitting: *Deleted

## 2012-12-25 VITALS — BP 118/86 | HR 74 | Temp 98.2°F | Resp 16

## 2012-12-25 DIAGNOSIS — E291 Testicular hypofunction: Secondary | ICD-10-CM

## 2012-12-25 MED ORDER — TESTOSTERONE CYPIONATE 200 MG/ML IM SOLN
200.0000 mg | Freq: Once | INTRAMUSCULAR | Status: AC
  Administered 2012-12-25: 200 mg via INTRAMUSCULAR

## 2013-01-05 ENCOUNTER — Ambulatory Visit: Payer: BC Managed Care – PPO

## 2013-01-05 ENCOUNTER — Ambulatory Visit (INDEPENDENT_AMBULATORY_CARE_PROVIDER_SITE_OTHER): Payer: BC Managed Care – PPO | Admitting: Emergency Medicine

## 2013-01-05 ENCOUNTER — Encounter: Payer: Self-pay | Admitting: Emergency Medicine

## 2013-01-05 VITALS — BP 94/64 | HR 81 | Temp 98.3°F | Resp 16 | Ht 68.5 in | Wt 170.0 lb

## 2013-01-05 DIAGNOSIS — R200 Anesthesia of skin: Secondary | ICD-10-CM

## 2013-01-05 DIAGNOSIS — M549 Dorsalgia, unspecified: Secondary | ICD-10-CM

## 2013-01-05 DIAGNOSIS — Z23 Encounter for immunization: Secondary | ICD-10-CM

## 2013-01-05 DIAGNOSIS — Z0000 Encounter for general adult medical examination without abnormal findings: Secondary | ICD-10-CM

## 2013-01-05 DIAGNOSIS — E291 Testicular hypofunction: Secondary | ICD-10-CM

## 2013-01-05 DIAGNOSIS — R209 Unspecified disturbances of skin sensation: Secondary | ICD-10-CM

## 2013-01-05 LAB — CBC WITH DIFFERENTIAL/PLATELET
Basophils Absolute: 0 10*3/uL (ref 0.0–0.1)
Basophils Relative: 1 % (ref 0–1)
Eosinophils Absolute: 0.1 10*3/uL (ref 0.0–0.7)
Eosinophils Relative: 2 % (ref 0–5)
HCT: 44 % (ref 39.0–52.0)
Hemoglobin: 14.9 g/dL (ref 13.0–17.0)
Lymphocytes Relative: 48 % — ABNORMAL HIGH (ref 12–46)
Lymphs Abs: 2.9 10*3/uL (ref 0.7–4.0)
MCH: 29.3 pg (ref 26.0–34.0)
MCHC: 33.9 g/dL (ref 30.0–36.0)
MCV: 86.4 fL (ref 78.0–100.0)
Monocytes Absolute: 0.5 10*3/uL (ref 0.1–1.0)
Monocytes Relative: 9 % (ref 3–12)
Neutro Abs: 2.5 10*3/uL (ref 1.7–7.7)
Neutrophils Relative %: 40 % — ABNORMAL LOW (ref 43–77)
Platelets: 364 10*3/uL (ref 150–400)
RBC: 5.09 MIL/uL (ref 4.22–5.81)
RDW: 14.1 % (ref 11.5–15.5)
WBC: 6 10*3/uL (ref 4.0–10.5)

## 2013-01-05 LAB — POCT URINALYSIS DIPSTICK
Bilirubin, UA: NEGATIVE
Blood, UA: NEGATIVE
Glucose, UA: NEGATIVE
Ketones, UA: NEGATIVE
Leukocytes, UA: NEGATIVE
Nitrite, UA: NEGATIVE
Protein, UA: NEGATIVE
Spec Grav, UA: 1.02
Urobilinogen, UA: 0.2
pH, UA: 6.5

## 2013-01-05 LAB — IFOBT (OCCULT BLOOD): IFOBT: NEGATIVE

## 2013-01-05 NOTE — Progress Notes (Signed)
@[email or web address] @  Patient ID: Marcus Gutierrez MRN: [id number], DOB: 1960-02-21 52 y.o. Date of Encounter: 01/05/2013, 12:47 PM  Primary Physician: Tally Due, MD  Chief Complaint: Physical (CPE)  HPI: 52 y.o. y/o male with history noted below here for CPE.  Doing well. No issues/complaints.  Review of Systems: Consitutional: No fever, chills, fatigue, night sweats, lymphadenopathy, or weight changes. Eyes: No visual changes, eye redness, or discharge. ENT/Mouth: Ears: No otalgia, tinnitus, hearing loss, discharge. Nose: No congestion, rhinorrhea, sinus pain, or epistaxis. Throat: No sore throat, post nasal drip, or teeth pain. Cardiovascular: No CP, palpitations, diaphoresis, DOE, edema, orthopnea, PND. Respiratory: No cough, hemoptysis, SOB, or wheezing. Gastrointestinal: No anorexia, dysphagia, reflux, pain, nausea, vomiting, hematemesis, diarrhea, constipation, BRBPR, or melena. Genitourinary: No dysuria, frequency, urgency, hematuria, incontinence, nocturia, decreased urinary stream, discharge, impotence, or testicular pain/masses. Musculoskeletal: No decreased ROM, myalgias, stiffness, joint swelling, or weakness. He is known to have degenerative disc disease in his neck. He recently has had pain in his lower back with radicular symptoms down the left leg. Skin: No rash, erythema, lesion changes, pain, warmth, jaundice, or pruritis. Neurological: No headache, dizziness, syncope, seizures, tremors, memory loss, coordination problems, or paresthesias. Psychological: No anxiety, depression, hallucinations, SI/HI. Endocrine: No fatigue, polydipsia, polyphagia, polyuria, or known diabetes. He has a history of hypogonadism and is on testosterone . All other systems were reviewed and are otherwise negative.  Past Medical History  Diagnosis Date  . Allergy   . GERD (gastroesophageal reflux disease)   . Seizures   . Ulcer   . Ulcerative colitis      Past Surgical History   Procedure Laterality Date  . Hernia repair      Home Meds:  Prior to Admission medications   Medication Sig Start Date End Date Taking? Authorizing Provider  alum hydroxide-mag trisilicate (GAVISCON) 80-20 MG CHEW chewable tablet Chew by mouth.   Yes Historical Provider, MD  Cholecalciferol (VITAMIN D3) 1000 UNITS CAPS Take 1 capsule by mouth daily.   Yes Historical Provider, MD  doxycycline (VIBRA-TABS) 100 MG tablet Take 1 tablet (100 mg total) by mouth 2 (two) times daily. 12/04/11  Yes Gwenlyn Found Copland, MD  esomeprazole (NEXIUM) 40 MG capsule Take 1 capsule (40 mg total) by mouth daily before breakfast. 03/22/11  Yes Collene Gobble, MD  mesalamine (LIALDA) 1.2 G EC tablet Take 1,200 mg by mouth daily with breakfast.   Yes Historical Provider, MD  Olopatadine HCl (PATADAY) 0.2 % SOLN Apply to eye.   Yes Historical Provider, MD  testosterone cypionate (DEPOTESTOTERONE CYPIONATE) 200 MG/ML injection Inject 1 mL (200 mg total) into the muscle every 14 (fourteen) days. 09/09/12  Yes Collene Gobble, MD    Allergies:  Allergies  Allergen Reactions  . Amoxicillin Other (See Comments)    hematochezia  . Androgel [Testosterone]     History   Social History  . Marital Status: Single    Spouse Name: N/A    Number of Children: N/A  . Years of Education: N/A   Occupational History  . Not on file.   Social History Main Topics  . Smoking status: Former Smoker    Quit date: 06/01/2003  . Smokeless tobacco: Not on file  . Alcohol Use: No  . Drug Use: No  . Sexual Activity: Not on file   Other Topics Concern  . Not on file   Social History Narrative  . No narrative on file    Family History  Problem Relation Age of  Onset  . Cancer Mother   . Heart attack Mother   . Heart attack Father   . Lupus Sister   . Asthma Son     Physical Exam: Blood pressure 94/64, pulse 81, temperature 98.3 F (36.8 C), resp. rate 16, height 5' 8.5" (1.74 m), weight 170 lb (77.111 kg).  General:  Well developed, well nourished, in no acute distress. HEENT: Normocephalic, atraumatic. Conjunctiva pink, sclera non-icteric. Pupils 2 mm constricting to 1 mm, round, regular, and equally reactive to light and accomodation. EOMI. Internal auditory canal clear. TMs with good cone of light and without pathology. Nasal mucosa pink. Nares are without discharge. No sinus tenderness. Oral mucosa pink. Dentition. Pharynx without exudate.   Neck: Supple. Trachea midline. No thyromegaly. Full ROM. No lymphadenopathy. Lungs: Clear to auscultation bilaterally without wheezes, rales, or rhonchi. Breathing is of normal effort and unlabored. Cardiovascular: RRR with S1 S2. No murmurs, rubs, or gallops appreciated. Distal pulses 2+ symmetrically. No carotid or abdominal bruits. Abdomen: Soft, non-tender, non-distended with normoactive bowel sounds. No hepatosplenomegaly or masses. No rebound/guarding. No CVA tenderness. Without hernias.  Rectal: No external hemorrhoids or fissures. Rectal vault without masses.  Genitourinary:  circumcised male. No penile lesions. Testes descended bilaterally, and smooth without tenderness or masses.  Musculoskeletal: Full range of motion and 5/5 strength throughout. Without swelling, atrophy, tenderness, crepitus, or warmth. Extremities without clubbing, cyanosis, or edema. Calves supple. Skin: Warm and moist without erythema, ecchymosis, wounds, or rash. Neuro: A+Ox3. CN II-XII grossly intact. Moves all extremities spontaneously. Full sensation throughout. Normal gait. DTR 2+ throughout upper and lower extremities. Finger to nose intact. Psych:  Responds to questions appropriately with a normal affect.  [facility] reading (PRIMARY) by  Dr Cleta Alberts . Tiny amount of vascular calcification otherwise normal  EKG there is an R wave in lead V1 but no acute changes  Studies: CBC, CMET, Lipid, PSA, free and total testosterone     Assessment/Plan:  52 y.o. y/o   male here for CPE routine labs  were done. He has his regular GI.physician follows his colitis.   -  Signed, [name], MD 01/05/2013 12:47 PM

## 2013-01-06 LAB — LIPID PANEL
Cholesterol: 140 mg/dL (ref 0–200)
HDL: 50 mg/dL (ref >39)
LDL Cholesterol: 80 mg/dL (ref 0–99)
Total CHOL/HDL Ratio: 2.8 Ratio
Triglycerides: 52 mg/dL (ref <150)
VLDL: 10 mg/dL (ref 0–40)

## 2013-01-06 LAB — COMPREHENSIVE METABOLIC PANEL
ALT: <8 U/L (ref 0–53)
AST: 12 U/L (ref 0–37)
Albumin: 4.2 g/dL (ref 3.5–5.2)
Alkaline Phosphatase: 64 U/L (ref 39–117)
BUN: 10 mg/dL (ref 6–23)
CO2: 29 mEq/L (ref 19–32)
Calcium: 9.5 mg/dL (ref 8.4–10.5)
Chloride: 101 mEq/L (ref 96–112)
Creat: 1 mg/dL (ref 0.50–1.35)
Glucose, Bld: 82 mg/dL (ref 70–99)
Potassium: 4.8 mEq/L (ref 3.5–5.3)
Sodium: 140 mEq/L (ref 135–145)
Total Bilirubin: 0.6 mg/dL (ref 0.3–1.2)
Total Protein: 7.2 g/dL (ref 6.0–8.3)

## 2013-01-06 LAB — PSA: PSA: 0.98 ng/mL (ref <=4.00)

## 2013-01-06 LAB — TESTOSTERONE, FREE, TOTAL, SHBG
Sex Hormone Binding: 26 nmol/L (ref 13–71)
Testosterone, Free: 120.6 pg/mL (ref 47.0–244.0)
Testosterone-% Free: 2.4 % (ref 1.6–2.9)
Testosterone: 504 ng/dL (ref 300–890)

## 2013-01-06 LAB — TSH: TSH: 1.059 u[IU]/mL (ref 0.350–4.500)

## 2013-01-13 ENCOUNTER — Telehealth: Payer: Self-pay | Admitting: Radiology

## 2013-01-13 DIAGNOSIS — M545 Low back pain, unspecified: Secondary | ICD-10-CM

## 2013-01-13 NOTE — Telephone Encounter (Signed)
I have gotten notice from the insurance [employer], insurance has declined the MIR scan of the Lumbar spine. He must fail 4-6 weeks of conservative treatment.

## 2013-01-13 NOTE — Telephone Encounter (Signed)
Call and let him know the insurance company has denied his MRI. Would he would like referral to one of the back specialists for their opinion if sole make him an appointment to see either Dr. Rolena Infante or Dr. Lynann Bologna

## 2013-01-14 ENCOUNTER — Ambulatory Visit (INDEPENDENT_AMBULATORY_CARE_PROVIDER_SITE_OTHER): Payer: BC Managed Care – PPO | Admitting: *Deleted

## 2013-01-14 VITALS — BP 128/86 | HR 92

## 2013-01-14 DIAGNOSIS — E291 Testicular hypofunction: Secondary | ICD-10-CM

## 2013-01-14 MED ORDER — TESTOSTERONE CYPIONATE 200 MG/ML IM SOLN
200.0000 mg | Freq: Once | INTRAMUSCULAR | Status: AC
  Administered 2013-01-14: 200 mg via INTRAMUSCULAR

## 2013-01-14 NOTE — Telephone Encounter (Signed)
Called him. Spoke to his wife. She is aware of the referral, and will let him know.

## 2013-02-18 ENCOUNTER — Ambulatory Visit (INDEPENDENT_AMBULATORY_CARE_PROVIDER_SITE_OTHER): Payer: BC Managed Care – PPO | Admitting: Family Medicine

## 2013-02-18 VITALS — BP 124/70 | HR 78 | Temp 98.7°F | Resp 18 | Ht 69.0 in | Wt 173.0 lb

## 2013-02-18 DIAGNOSIS — J019 Acute sinusitis, unspecified: Secondary | ICD-10-CM

## 2013-02-18 DIAGNOSIS — E291 Testicular hypofunction: Secondary | ICD-10-CM

## 2013-02-18 DIAGNOSIS — H6121 Impacted cerumen, right ear: Secondary | ICD-10-CM

## 2013-02-18 DIAGNOSIS — H6120 Impacted cerumen, unspecified ear: Secondary | ICD-10-CM

## 2013-02-18 DIAGNOSIS — R509 Fever, unspecified: Secondary | ICD-10-CM

## 2013-02-18 MED ORDER — DOXYCYCLINE HYCLATE 100 MG PO TABS: 100.0000 mg | ORAL_TABLET | Freq: Two times a day (BID) | ORAL | Status: DC

## 2013-02-18 MED ORDER — TESTOSTERONE CYPIONATE 200 MG/ML IM SOLN
200.0000 mg | Freq: Once | INTRAMUSCULAR | Status: AC
  Administered 2013-02-18: 200 mg via INTRAMUSCULAR

## 2013-02-18 NOTE — Patient Instructions (Signed)
Let's try having you use a higher dose of doxycycline for 10 days.  Let me know if your symptoms do not resolve.

## 2013-02-18 NOTE — Progress Notes (Signed)
[facility] [address], [place] [address] [phone]  Date:  02/18/2013   Name:  Marcus Gutierrez   DOB:  07/08/1960   MRN:  [id number]  PCP:  Tally DueGUEST, CHRIS WARREN, MD    Chief Complaint: left ear stopped up, Injections and sinus issues   History of Present Illness:  Marcus Gutierrez is a 53 y.o. very pleasant male patient who presents with the following:  He needs his testosterone shot for hypogonadism  He has noted some headaches for about one week. He thinks he might have a sinus infection.  He is blowing out discolored mucus from his nose.  He has not noted a fever.   He does not have a cough- his sx are mostly in his sinuses He is confused about this infection because he is taking doxycycline per his periodontist.  However he is only taking 20mg  twice a day for gum disease.   He also has noted that his left ear is clogged up. He has tried some OTC wax softening drops. However he is not getting better and his ear is now uncomfortable. [facility] and was told that he had a cerumen impaction   His last testesterone shot was just over a month ago- he is supposed to do q2 weeks but missed due to Christmas traveling.  Patient Active Problem List   Diagnosis Date Noted  . Colitis 12/11/2011  . Hypogonadism male 11/29/2011    Past Medical History  Diagnosis Date  . Allergy   . GERD (gastroesophageal reflux disease)   . Seizures   . Ulcer   . Ulcerative colitis     Past Surgical History  Procedure Laterality Date  . Hernia repair      History  Substance Use Topics  . Smoking status: Former Smoker    Quit date: 06/01/2003  . Smokeless tobacco: Not on file  . Alcohol Use: No    Family History  Problem Relation Age of Onset  . Cancer Mother   . Heart attack Mother   . Heart attack Father   . Lupus Sister   . Asthma Son     Allergies  Allergen Reactions  . Amoxicillin Other (See Comments)    hematochezia  .  Androgel [Testosterone]     Medication list has been reviewed and updated.  Current Outpatient Prescriptions on File Prior to Visit  Medication Sig Dispense Refill  . alum hydroxide-mag trisilicate (GAVISCON) 80-20 MG CHEW chewable tablet Chew by mouth.      . Cholecalciferol (VITAMIN D3) 1000 UNITS CAPS Take 1 capsule by mouth daily.      Marland Kitchen. doxycycline (VIBRA-TABS) 100 MG tablet Take 1 tablet (100 mg total) by mouth 2 (two) times daily.  20 tablet  0  . esomeprazole (NEXIUM) 40 MG capsule Take 1 capsule (40 mg total) by mouth daily before breakfast.  30 capsule  12  . mesalamine (LIALDA) 1.2 G EC tablet Take 1,200 mg by mouth daily with breakfast.      . Olopatadine HCl (PATADAY) 0.2 % SOLN Apply to eye.      . testosterone cypionate (DEPOTESTOTERONE CYPIONATE) 200 MG/ML injection Inject 1 mL (200 mg total) into the muscle every 14 (fourteen) days.  10 mL  0   No current facility-administered medications on file prior to visit.    Review of Systems:  As per HPI- otherwise negative.   Physical Examination: Filed Vitals:   02/18/13 1637  BP: 124/70  Pulse: 78  Temp: 98.7 F (37.1 C)  Resp: 18   Filed Vitals:   02/18/13 1637  Height: 5\' 9"  (1.753 m)  Weight: 173 lb (78.472 kg)   Body mass index is 25.54 kg/(m^2). Ideal Body Weight: Weight in (lb) to have BMI = 25: 168.9  GEN: WDWN, NAD, Non-toxic, A & O x 3   HEENT: Atraumatic, Normocephalic. Neck supple. No masses, No LAD.  right TM wnl, oropharynx normal.  PEERL,EOMI.   Nasal passages are inflamed, sinuses tender to percussion  Ears and Nose: No external deformity. CV: RRR, No M/G/R. No JVD. No thrill. No extra heart sounds. PULM: CTA B, no wheezes, crackles, rhonchi. No retractions. No resp. distress. No accessory muscle use. EXTR: No c/c/e NEURO Normal gait.  PSYCH: Normally interactive. Conversant. Not depressed or anxious appearing.  Calm demeanor.  Left cerumen impaction.  Treated with colace and then irrigated  with warm water- wax removed easily.  Left TM then normal   Assessment and Plan: Fever - Plan: doxycycline (VIBRA-TABS) 100 MG tablet  Cerumen impaction, right  Sinusitis, acute  Hypogonadism male  Change to 100mg  doxy BID for 10 days.  Cerumen impaction resolved as above. Testosterone shot given  See patient instructions for more details.     Signed Abbe Amsterdam, MD

## 2013-02-22 ENCOUNTER — Encounter: Payer: Self-pay | Admitting: Family Medicine

## 2013-02-23 ENCOUNTER — Ambulatory Visit (INDEPENDENT_AMBULATORY_CARE_PROVIDER_SITE_OTHER): Payer: BC Managed Care – PPO | Admitting: Family Medicine

## 2013-02-23 ENCOUNTER — Encounter: Payer: Self-pay | Admitting: Family Medicine

## 2013-02-23 VITALS — BP 114/78 | HR 90 | Temp 98.5°F | Resp 18 | Ht 70.0 in | Wt 178.0 lb

## 2013-02-23 DIAGNOSIS — H6980 Other specified disorders of Eustachian tube, unspecified ear: Secondary | ICD-10-CM

## 2013-02-23 MED ORDER — FLUTICASONE PROPIONATE 50 MCG/ACT NA SUSP: 2.0000 | Freq: Every day | NASAL | Status: DC

## 2013-02-23 MED ORDER — DM-GUAIFENESIN ER 30-600 MG PO TB12: 1.0000 | ORAL_TABLET | Freq: Two times a day (BID) | ORAL | Status: DC | PRN

## 2013-02-23 NOTE — Patient Instructions (Addendum)
I have sent an rx for flonase spray to your drug store- also mucinex D which you can use as needed.   Let me know if you are not feeling better in the next few days- Sooner if worse.

## 2013-02-23 NOTE — Progress Notes (Signed)
[facility] [address], [place] [address] [phone]  Date:  02/23/2013   Name:  Marcus Gutierrez   DOB:  [birth year]   MRN:  [id number]  PCP:  Kennon Portela, MD    Chief Complaint: Otalgia   History of Present Illness:  Marcus Gutierrez is a 53 y.o. very pleasant male patient who presents with the following:  Here today to rechech his ear.  He was seen here on 1/8- at that time we irrigated his left ear after he had already used some wax softening drops at home.  Easily removed impacted cerumen and revealed normal left TM.   Did start him on doxycycline at that time due to sinus infection   He has noted a persistent feeling of his ear being clogged.  He has noted a "low grade headache" for the last 2 days.  He feels like "there is still water in my ear."  No drainage from the ear.   His hearing is sometimes muffled.   No fever.  Right ear is ok  Patient Active Problem List   Diagnosis Date Noted  . Colitis 12/11/2011  . Hypogonadism male 11/29/2011    Past Medical History  Diagnosis Date  . Allergy   . GERD (gastroesophageal reflux disease)   . Seizures   . Ulcer   . Ulcerative colitis     Past Surgical History  Procedure Laterality Date  . Hernia repair      History  Substance Use Topics  . Smoking status: Former Smoker    Quit date: 06/01/2003  . Smokeless tobacco: Not on file  . Alcohol Use: No    Family History  Problem Relation Age of Onset  . Cancer Mother   . Heart attack Mother   . Heart attack Father   . Lupus Sister   . Asthma Son     Allergies  Allergen Reactions  . Amoxicillin Other (See Comments)    hematochezia  . Androgel [Testosterone]     Medication list has been reviewed and updated.  Current Outpatient Prescriptions on File Prior to Visit  Medication Sig Dispense Refill  . acetaminophen (TYLENOL) 500 MG tablet Take 500 mg by mouth every 6 (six) hours as needed.      Marland Kitchen alum hydroxide-mag  trisilicate (GAVISCON) 56-81 MG CHEW chewable tablet Chew by mouth.      . Cholecalciferol (VITAMIN D3) 1000 UNITS CAPS Take 1 capsule by mouth daily.      Marland Kitchen doxycycline (VIBRA-TABS) 100 MG tablet Take 1 tablet (100 mg total) by mouth 2 (two) times daily.  20 tablet  0  . esomeprazole (NEXIUM) 40 MG capsule Take 1 capsule (40 mg total) by mouth daily before breakfast.  30 capsule  12  . mesalamine (LIALDA) 1.2 G EC tablet Take 1,200 mg by mouth daily with breakfast.      . testosterone cypionate (DEPOTESTOTERONE CYPIONATE) 200 MG/ML injection Inject 1 mL (200 mg total) into the muscle every 14 (fourteen) days.  10 mL  0  . Olopatadine HCl (PATADAY) 0.2 % SOLN Apply to eye.       No current facility-administered medications on file prior to visit.    Review of Systems:  As per HPI- otherwise negative. No tinnitus  Physical Examination: Filed Vitals:   02/23/13 1641  BP: 114/78  Pulse: 98  Temp: 98.5 F (36.9 C)  Resp: 18   Filed Vitals:   02/23/13 1641  Height: 5' 10"  (  1.778 m)  Weight: 178 lb (80.74 kg)   Body mass index is 25.54 kg/(m^2). Ideal Body Weight: Weight in (lb) to have BMI = 25: 173.9  GEN: WDWN, NAD, Non-toxic, A & O x 3 HEENT: Atraumatic, Normocephalic. Neck supple. No masses, No LAD.  Left TM is normal- no effusion, redness, retraction or bulge. Nasal cavity is congested  Ears and Nose: No external deformity. CV: RRR, No M/G/R. No JVD. No thrill. No extra heart sounds. PULM: CTA B, no wheezes, crackles, rhonchi. No retractions. No resp. distress. No accessory muscle use. EXTR: No c/c/e NEURO Normal gait.  PSYCH: Normally interactive. Conversant. Not depressed or anxious appearing.  Calm demeanor.    Assessment and Plan: ETD (eustachian tube dysfunction) - Plan: fluticasone (FLONASE) 50 MCG/ACT nasal spray, dextromethorphan-guaiFENesin (MUCINEX DM) 30-600 MG per 12 hr tablet  suspect that his persistent sx are due to ETD.  Will add flonase and mucinex Dm to  his regimen.  Asked him to please let me know if not better soon  Signed Lamar Blinks, MD

## 2013-03-06 ENCOUNTER — Other Ambulatory Visit (INDEPENDENT_AMBULATORY_CARE_PROVIDER_SITE_OTHER): Payer: BC Managed Care – PPO | Admitting: *Deleted

## 2013-03-06 VITALS — BP 120/80 | HR 88 | Resp 16

## 2013-03-06 DIAGNOSIS — E291 Testicular hypofunction: Secondary | ICD-10-CM

## 2013-03-06 MED ORDER — TESTOSTERONE CYPIONATE 200 MG/ML IM SOLN
200.0000 mg | Freq: Once | INTRAMUSCULAR | Status: AC
  Administered 2013-03-06: 200 mg via INTRAMUSCULAR

## 2013-03-17 ENCOUNTER — Telehealth: Payer: Self-pay

## 2013-03-22 ENCOUNTER — Telehealth: Payer: Self-pay | Admitting: *Deleted

## 2013-03-22 ENCOUNTER — Other Ambulatory Visit (INDEPENDENT_AMBULATORY_CARE_PROVIDER_SITE_OTHER): Payer: BC Managed Care – PPO | Admitting: *Deleted

## 2013-03-22 DIAGNOSIS — E291 Testicular hypofunction: Secondary | ICD-10-CM

## 2013-03-22 MED ORDER — TESTOSTERONE CYPIONATE 200 MG/ML IM SOLN
200.0000 mg | Freq: Once | INTRAMUSCULAR | Status: AC
  Administered 2013-03-22: 200 mg via INTRAMUSCULAR

## 2013-03-22 NOTE — Telephone Encounter (Signed)
[facility] rx for testosterone injection.  [facility]

## 2013-03-22 NOTE — Telephone Encounter (Signed)
Okay to call in prescription for his testosterone.

## 2013-03-23 MED ORDER — TESTOSTERONE CYPIONATE 200 MG/ML IM SOLN: 200.0000 mg | INTRAMUSCULAR | Status: DC

## 2013-03-23 NOTE — Telephone Encounter (Signed)
Reorder medication , [facility] [address].

## 2013-04-05 ENCOUNTER — Ambulatory Visit (INDEPENDENT_AMBULATORY_CARE_PROVIDER_SITE_OTHER): Payer: BC Managed Care – PPO | Admitting: Family Medicine

## 2013-04-05 VITALS — BP 110/70 | HR 81 | Temp 98.3°F | Resp 16 | Ht 69.0 in | Wt 177.4 lb

## 2013-04-05 DIAGNOSIS — J069 Acute upper respiratory infection, unspecified: Secondary | ICD-10-CM

## 2013-04-05 DIAGNOSIS — M542 Cervicalgia: Secondary | ICD-10-CM

## 2013-04-05 DIAGNOSIS — J0100 Acute maxillary sinusitis, unspecified: Secondary | ICD-10-CM

## 2013-04-05 DIAGNOSIS — M62838 Other muscle spasm: Secondary | ICD-10-CM

## 2013-04-05 DIAGNOSIS — R635 Abnormal weight gain: Secondary | ICD-10-CM

## 2013-04-05 MED ORDER — DOXYCYCLINE HYCLATE 100 MG PO TABS: 100.0000 mg | ORAL_TABLET | Freq: Two times a day (BID) | ORAL | Status: DC

## 2013-04-05 MED ORDER — CYCLOBENZAPRINE HCL 5 MG PO TABS: ORAL_TABLET | ORAL | Status: DC

## 2013-04-05 MED ORDER — GUAIFENESIN ER 600 MG PO TB12: 1200.0000 mg | ORAL_TABLET | Freq: Two times a day (BID) | ORAL | Status: DC

## 2013-04-05 NOTE — Patient Instructions (Signed)
Saline nasal spray atleast 4 times per day, over the counter mucinex as discussed, drink plenty of fluids. Start new dose of doxycycline, and muscle relaxant if need for neck.  Ice or heat and gentle range of motion as able. Return to the clinic or go to the nearest emergency room if any of your symptoms worsen or new symptoms occur.  Sinusitis Sinusitis is redness, soreness, and swelling (inflammation) of the paranasal sinuses. Paranasal sinuses are air pockets within the bones of your face (beneath the eyes, the middle of the forehead, or above the eyes). In healthy paranasal sinuses, mucus is able to drain out, and air is able to circulate through them by way of your nose. However, when your paranasal sinuses are inflamed, mucus and air can become trapped. This can allow bacteria and other germs to grow and cause infection. Sinusitis can develop quickly and last only a short time (acute) or continue over a long period (chronic). Sinusitis that lasts for more than 12 weeks is considered chronic.  CAUSES  Causes of sinusitis include:  Allergies.  Structural abnormalities, such as displacement of the cartilage that separates your nostrils (deviated septum), which can decrease the air flow through your nose and sinuses and affect sinus drainage.  Functional abnormalities, such as when the small hairs (cilia) that line your sinuses and help remove mucus do not work properly or are not present. SYMPTOMS  Symptoms of acute and chronic sinusitis are the same. The primary symptoms are pain and pressure around the affected sinuses. Other symptoms include:  Upper toothache.  Earache.  Headache.  Bad breath.  Decreased sense of smell and taste.  A cough, which worsens when you are lying flat.  Fatigue.  Fever.  Thick drainage from your nose, which often is green and may contain pus (purulent).  Swelling and warmth over the affected sinuses. DIAGNOSIS  Your caregiver will perform a physical  exam. During the exam, your caregiver may:  Look in your nose for signs of abnormal growths in your nostrils (nasal polyps).  Tap over the affected sinus to check for signs of infection.  View the inside of your sinuses (endoscopy) with a special imaging device with a light attached (endoscope), which is inserted into your sinuses. If your caregiver suspects that you have chronic sinusitis, one or more of the following tests may be recommended:  Allergy tests.  Nasal culture A sample of mucus is taken from your nose and sent to a lab and screened for bacteria.  Nasal cytology A sample of mucus is taken from your nose and examined by your caregiver to determine if your sinusitis is related to an allergy. TREATMENT  Most cases of acute sinusitis are related to a viral infection and will resolve on their own within 10 days. Sometimes medicines are prescribed to help relieve symptoms (pain medicine, decongestants, nasal steroid sprays, or saline sprays).  However, for sinusitis related to a bacterial infection, your caregiver will prescribe antibiotic medicines. These are medicines that will help kill the bacteria causing the infection.  Rarely, sinusitis is caused by a fungal infection. In theses cases, your caregiver will prescribe antifungal medicine. For some cases of chronic sinusitis, surgery is needed. Generally, these are cases in which sinusitis recurs more than 3 times per year, despite other treatments. HOME CARE INSTRUCTIONS   Drink plenty of water. Water helps thin the mucus so your sinuses can drain more easily.  Use a humidifier.  Inhale steam 3 to 4 times a day (  for example, sit in the bathroom with the shower running).  Apply a warm, moist washcloth to your face 3 to 4 times a day, or as directed by your caregiver.  Use saline nasal sprays to help moisten and clean your sinuses.  Take over-the-counter or prescription medicines for pain, discomfort, or fever only as  directed by your caregiver. SEEK IMMEDIATE MEDICAL CARE IF:  You have increasing pain or severe headaches.  You have nausea, vomiting, or drowsiness.  You have swelling around your face.  You have vision problems.  You have a stiff neck.  You have difficulty breathing. MAKE SURE YOU:   Understand these instructions.  Will watch your condition.  Will get help right away if you are not doing well or get worse. Document Released: 01/28/2005 Document Revised: 04/22/2011 Document Reviewed: 02/12/2011 [facility] Patient Information 2014 [place], [place]. Upper Respiratory Infection, Adult An upper respiratory infection (URI) is also sometimes known as the common cold. The upper respiratory tract includes the nose, sinuses, throat, trachea, and bronchi. Bronchi are the airways leading to the lungs. Most people improve within 1 week, but symptoms can last up to 2 weeks. A residual cough may last even longer.  CAUSES Many different viruses can infect the tissues lining the upper respiratory tract. The tissues become irritated and inflamed and often become very moist. Mucus production is also common. A cold is contagious. You can easily spread the virus to others by oral contact. This includes kissing, sharing a glass, coughing, or sneezing. Touching your mouth or nose and then touching a surface, which is then touched by another person, can also spread the virus. SYMPTOMS  Symptoms typically develop 1 to 3 days after you come in contact with a cold virus. Symptoms vary from person to person. They may include:  Runny nose.  Sneezing.  Nasal congestion.  Sinus irritation.  Sore throat.  Loss of voice (laryngitis).  Cough.  Fatigue.  Muscle aches.  Loss of appetite.  Headache.  Low-grade fever. DIAGNOSIS  You might diagnose your own cold based on familiar symptoms, since most people get a cold 2 to 3 times a year. Your caregiver can confirm this based on your exam. Most  importantly, your caregiver can check that your symptoms are not due to another disease such as strep throat, sinusitis, pneumonia, asthma, or epiglottitis. Blood tests, throat tests, and X-rays are not necessary to diagnose a common cold, but they may sometimes be helpful in excluding other more serious diseases. Your caregiver will decide if any further tests are required. RISKS AND COMPLICATIONS  You may be at risk for a more severe case of the common cold if you smoke cigarettes, have chronic heart disease (such as heart failure) or lung disease (such as asthma), or if you have a weakened immune system. The very young and very old are also at risk for more serious infections. Bacterial sinusitis, middle ear infections, and bacterial pneumonia can complicate the common cold. The common cold can worsen asthma and chronic obstructive pulmonary disease (COPD). Sometimes, these complications can require emergency medical care and may be life-threatening. PREVENTION  The best way to protect against getting a cold is to practice good hygiene. Avoid oral or hand contact with people with cold symptoms. Wash your hands often if contact occurs. There is no clear evidence that vitamin C, vitamin E, echinacea, or exercise reduces the chance of developing a cold. However, it is always recommended to get plenty of rest and practice good nutrition. TREATMENT  Treatment is directed at relieving symptoms. There is no cure. Antibiotics are not effective, because the infection is caused by a virus, not by bacteria. Treatment may include:  Increased fluid intake. Sports drinks offer valuable electrolytes, sugars, and fluids.  Breathing heated mist or steam (vaporizer or shower).  Eating chicken soup or other clear broths, and maintaining good nutrition.  Getting plenty of rest.  Using gargles or lozenges for comfort.  Controlling fevers with ibuprofen or acetaminophen as directed by your caregiver.  Increasing  usage of your inhaler if you have asthma. Zinc gel and zinc lozenges, taken in the first 24 hours of the common cold, can shorten the duration and lessen the severity of symptoms. Pain medicines may help with fever, muscle aches, and throat pain. A variety of non-prescription medicines are available to treat congestion and runny nose. Your caregiver can make recommendations and may suggest nasal or lung inhalers for other symptoms.  HOME CARE INSTRUCTIONS   Only take over-the-counter or prescription medicines for pain, discomfort, or fever as directed by your caregiver.  Use a warm mist humidifier or inhale steam from a shower to increase air moisture. This may keep secretions moist and make it easier to breathe.  Drink enough water and fluids to keep your urine clear or pale yellow.  Rest as needed.  Return to work when your temperature has returned to normal or as your caregiver advises. You may need to stay home longer to avoid infecting others. You can also use a face mask and careful hand washing to prevent spread of the virus. SEEK MEDICAL CARE IF:   After the first few days, you feel you are getting worse rather than better.  You need your caregiver's advice about medicines to control symptoms.  You develop chills, worsening shortness of breath, or brown or red sputum. These may be signs of pneumonia.  You develop yellow or brown nasal discharge or pain in the face, especially when you bend forward. These may be signs of sinusitis.  You develop a fever, swollen neck glands, pain with swallowing, or white areas in the back of your throat. These may be signs of strep throat. SEEK IMMEDIATE MEDICAL CARE IF:   You have a fever.  You develop severe or persistent headache, ear pain, sinus pain, or chest pain.  You develop wheezing, a prolonged cough, cough up blood, or have a change in your usual mucus (if you have chronic lung disease).  You develop sore muscles or a stiff  neck. Document Released: 07/24/2000 Document Revised: 04/22/2011 Document Reviewed: 06/01/2010 [facility] Patient Information 2014 [place], [place].

## 2013-04-05 NOTE — Progress Notes (Signed)
Subjective:    Patient ID: Marcus Gutierrez, male    DOB: 10/16/1960, 53 y.o.   MRN: [id number]  HPI Marcus Gutierrez is a 53 y.o. male  Started 1 week ago - cough, nasal discharge. Green phlegm. No fever. Phlegm now lightening, but still green somewhat, but usually in morning, dry cough during day. Some lessening of cough. Slight HA for past 3 days. Pressure in both cheeks. No tooth pain.  No dyspnea. Some soreness in muscles around neck past few days. 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 Worst sx - HA, bodyaches, sinus pressure. No photo/phonophobia.   Seen last month with similar sx's with doxycycline. Also took mucinex then.   Tx: mucinex few times.   SH: no known sick contacts. 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    Had flu shot in the fall.   No known lung problems.     Patient Active Problem List   Diagnosis Date Noted  . Colitis 12/11/2011  . Hypogonadism male 11/29/2011   Past Medical History  Diagnosis Date  . Allergy   . GERD (gastroesophageal reflux disease)   . Seizures   . Ulcer   . Ulcerative colitis    Past Surgical History  Procedure Laterality Date  . Hernia repair     Allergies  Allergen Reactions  . Amoxicillin Other (See Comments)    hematochezia  . Androgel [Testosterone]    Prior to Admission medications   Medication Sig Start Date End Date Taking? Authorizing Provider  acetaminophen (TYLENOL) 500 MG tablet Take 500 mg by mouth every 6 (six) hours as needed.   Yes Historical Provider, MD  alum hydroxide-mag trisilicate (GAVISCON) 80-20 MG CHEW chewable tablet Chew by mouth.   Yes Historical Provider, MD  Cholecalciferol (VITAMIN D3) 1000 UNITS CAPS Take 1 capsule by mouth daily.   Yes Historical Provider, MD  esomeprazole (NEXIUM) 40 MG capsule Take 1 capsule (40 mg total) by mouth daily before breakfast. 03/22/11  Yes Collene GobbleSteven A Daub, MD  mesalamine (LIALDA) 1.2 G EC tablet Take 1,200 mg by mouth daily with breakfast.   Yes Historical Provider, MD  Olopatadine HCl (PATADAY) 0.2 %  SOLN Apply to eye.   Yes Historical Provider, MD  testosterone cypionate (DEPOTESTOTERONE CYPIONATE) 200 MG/ML injection Inject 1 mL (200 mg total) into the muscle every 14 (fourteen) days. 03/23/13  Yes Collene GobbleSteven A Daub, MD  dextromethorphan-guaiFENesin [facility](MUCINEX DM) 30-600 MG per 12 hr tablet Take 1 tablet by mouth 2 (two) times daily as needed for cough. 02/23/13   Gwenlyn FoundJessica C Copland, MD  doxycycline (VIBRA-TABS) 100 MG tablet Take 1 tablet (100 mg total) by mouth 2 (two) times daily. 02/18/13   Gwenlyn FoundJessica C Copland, MD  fluticasone (FLONASE) 50 MCG/ACT nasal spray Place 2 sprays into both nostrils daily. 02/23/13   Pearline CablesJessica C Copland, MD   History   Social History  . Marital Status: Single    Spouse Name: N/A    Number of Children: N/A  . Years of Education: N/A   Occupational History  . Not on file.   Social History Main Topics  . Smoking status: Former Smoker    Quit date: 06/01/2003  . Smokeless tobacco: Not on file  . Alcohol Use: No  . Drug Use: No  . Sexual Activity: Not on file   Other Topics Concern  . Not on file   Social History Narrative  . No narrative on file       Review of Systems  Constitutional: Negative for fever and chills.  HENT: Positive for sinus pressure. Negative for trouble swallowing.   Respiratory: Positive for cough. Negative for shortness of breath.        Objective:   Physical Exam  Vitals reviewed. Constitutional: He is oriented to person, place, and time. He appears well-developed and well-nourished.  HENT:  Head: Normocephalic and atraumatic.  Right Ear: Tympanic membrane, external ear and ear canal normal.  Left Ear: Tympanic membrane, external ear and ear canal normal.  Nose: No rhinorrhea. Right sinus exhibits maxillary sinus tenderness. Left sinus exhibits maxillary sinus tenderness (more notable on left. ).  Mouth/Throat: Oropharynx is clear and moist and mucous membranes are normal. No oropharyngeal exudate or posterior oropharyngeal  erythema.  Eyes: Conjunctivae are normal. Pupils are equal, round, and reactive to light.  Neck: Neck supple.  Cardiovascular: Normal rate, regular rhythm, normal heart sounds and intact distal pulses.   No murmur heard. Pulmonary/Chest: Effort normal and breath sounds normal. He has no wheezes. He has no rhonchi. He has no rales.  Abdominal: Soft. There is no tenderness.  Musculoskeletal:       Cervical back: He exhibits tenderness and spasm (L greater than r paraspinal/trapezius ttp, from, but pain at terminal rom. ). He exhibits normal range of motion and no bony tenderness.  Lymphadenopathy:    He has no cervical adenopathy.  Neurological: He is alert and oriented to person, place, and time. He has normal strength. No sensory deficit.  Reflex Scores:      Tricep reflexes are 1+ on the right side and 1+ on the left side.      Bicep reflexes are 1+ on the right side and 1+ on the left side.      Brachioradialis reflexes are 1+ on the right side and 1+ on the left side. Skin: Skin is warm and dry. No rash noted.  Psychiatric: He has a normal mood and affect. His behavior is normal.   Filed Vitals:   04/05/13 0812  BP: 110/70  Pulse: 81  Temp: 98.3 F (36.8 C)  TempSrc: Oral  Resp: 16  Height: 5\' 9"  (1.753 m)  Weight: 177 lb 6.4 oz (80.468 kg)  SpO2: 97%       Assessment & Plan:   Marcus Gutierrez is a 53 y.o. male Acute upper respiratory infections of unspecified site - Plan: guaiFENesin (MUCINEX) 600 MG 12 hr tablet, Sinusitis, acute, maxillary - Plan: doxycycline (VIBRA-TABS) 100 MG tablet  -initial viral uri, now with likely early maxillary sinusitis with secondary sickening. Intolerant of amoxicillin and prior sinusitis improved with doxy. rtc precautions discussed if not improving on this course. Sx care.   Neck pain on left side, Muscle spasms of neck - Plan: cyclobenzaprine (FLEXERIL) 5 MG tablet  -spasm with myalgias of underlying illness vs. DDD as known in back.  Trial of sx care, muscle relaxant - SED, and recheck if not improving this week.    Meds ordered this encounter  Medications  . guaiFENesin (MUCINEX) 600 MG 12 hr tablet    Sig: Take 2 tablets (1,200 mg total) by mouth 2 (two) times daily.    Dispense:  40 tablet    Refill:  0  . doxycycline (VIBRA-TABS) 100 MG tablet    Sig: Take 1 tablet (100 mg total) by mouth 2 (two) times daily.    Dispense:  20 tablet    Refill:  0  . cyclobenzaprine (FLEXERIL) 5 MG tablet    Sig: 1 pill by mouth up to every 8 hours  as needed. Start with one pill by mouth each bedtime as needed due to sedation    Dispense:  15 tablet    Refill:  0   Patient Instructions  Saline nasal spray atleast 4 times per day, over the counter mucinex as discussed, drink plenty of fluids. Start new dose of doxycycline, and muscle relaxant if need for neck.  Ice or heat and gentle range of motion as able. Return to the clinic or go to the nearest emergency room if any of your symptoms worsen or new symptoms occur.  Sinusitis Sinusitis is redness, soreness, and swelling (inflammation) of the paranasal sinuses. Paranasal sinuses are air pockets within the bones of your face (beneath the eyes, the middle of the forehead, or above the eyes). In healthy paranasal sinuses, mucus is able to drain out, and air is able to circulate through them by way of your nose. However, when your paranasal sinuses are inflamed, mucus and air can become trapped. This can allow bacteria and other germs to grow and cause infection. Sinusitis can develop quickly and last only a short time (acute) or continue over a long period (chronic). Sinusitis that lasts for more than 12 weeks is considered chronic.  CAUSES  Causes of sinusitis include:  Allergies.  Structural abnormalities, such as displacement of the cartilage that separates your nostrils (deviated septum), which can decrease the air flow through your nose and sinuses and affect sinus  drainage.  Functional abnormalities, such as when the small hairs (cilia) that line your sinuses and help remove mucus do not work properly or are not present. SYMPTOMS  Symptoms of acute and chronic sinusitis are the same. The primary symptoms are pain and pressure around the affected sinuses. Other symptoms include:  Upper toothache.  Earache.  Headache.  Bad breath.  Decreased sense of smell and taste.  A cough, which worsens when you are lying flat.  Fatigue.  Fever.  Thick drainage from your nose, which often is green and may contain pus (purulent).  Swelling and warmth over the affected sinuses. DIAGNOSIS  Your caregiver will perform a physical exam. During the exam, your caregiver may:  Look in your nose for signs of abnormal growths in your nostrils (nasal polyps).  Tap over the affected sinus to check for signs of infection.  View the inside of your sinuses (endoscopy) with a special imaging device with a light attached (endoscope), which is inserted into your sinuses. If your caregiver suspects that you have chronic sinusitis, one or more of the following tests may be recommended:  Allergy tests.  Nasal culture A sample of mucus is taken from your nose and sent to a lab and screened for bacteria.  Nasal cytology A sample of mucus is taken from your nose and examined by your caregiver to determine if your sinusitis is related to an allergy. TREATMENT  Most cases of acute sinusitis are related to a viral infection and will resolve on their own within 10 days. Sometimes medicines are prescribed to help relieve symptoms (pain medicine, decongestants, nasal steroid sprays, or saline sprays).  However, for sinusitis related to a bacterial infection, your caregiver will prescribe antibiotic medicines. These are medicines that will help kill the bacteria causing the infection.  Rarely, sinusitis is caused by a fungal infection. In theses cases, your caregiver will  prescribe antifungal medicine. For some cases of chronic sinusitis, surgery is needed. Generally, these are cases in which sinusitis recurs more than 3 times per year, despite other treatments.  HOME CARE INSTRUCTIONS   Drink plenty of water. Water helps thin the mucus so your sinuses can drain more easily.  Use a humidifier.  Inhale steam 3 to 4 times a day (for example, sit in the bathroom with the shower running).  Apply a warm, moist washcloth to your face 3 to 4 times a day, or as directed by your caregiver.  Use saline nasal sprays to help moisten and clean your sinuses.  Take over-the-counter or prescription medicines for pain, discomfort, or fever only as directed by your caregiver. SEEK IMMEDIATE MEDICAL CARE IF:  You have increasing pain or severe headaches.  You have nausea, vomiting, or drowsiness.  You have swelling around your face.  You have vision problems.  You have a stiff neck.  You have difficulty breathing. MAKE SURE YOU:   Understand these instructions.  Will watch your condition.  Will get help right away if you are not doing well or get worse. Document Released: 01/28/2005 Document Revised: 04/22/2011 Document Reviewed: 02/12/2011 [facility] Patient Information 2014 [place], [place]. Upper Respiratory Infection, Adult An upper respiratory infection (URI) is also sometimes known as the common cold. The upper respiratory tract includes the nose, sinuses, throat, trachea, and bronchi. Bronchi are the airways leading to the lungs. Most people improve within 1 week, but symptoms can last up to 2 weeks. A residual cough may last even longer.  CAUSES Many different viruses can infect the tissues lining the upper respiratory tract. The tissues become irritated and inflamed and often become very moist. Mucus production is also common. A cold is contagious. You can easily spread the virus to others by oral contact. This includes kissing, sharing a glass, coughing, or  sneezing. Touching your mouth or nose and then touching a surface, which is then touched by another person, can also spread the virus. SYMPTOMS  Symptoms typically develop 1 to 3 days after you come in contact with a cold virus. Symptoms vary from person to person. They may include:  Runny nose.  Sneezing.  Nasal congestion.  Sinus irritation.  Sore throat.  Loss of voice (laryngitis).  Cough.  Fatigue.  Muscle aches.  Loss of appetite.  Headache.  Low-grade fever. DIAGNOSIS  You might diagnose your own cold based on familiar symptoms, since most people get a cold 2 to 3 times a year. Your caregiver can confirm this based on your exam. Most importantly, your caregiver can check that your symptoms are not due to another disease such as strep throat, sinusitis, pneumonia, asthma, or epiglottitis. Blood tests, throat tests, and X-rays are not necessary to diagnose a common cold, but they may sometimes be helpful in excluding other more serious diseases. Your caregiver will decide if any further tests are required. RISKS AND COMPLICATIONS  You may be at risk for a more severe case of the common cold if you smoke cigarettes, have chronic heart disease (such as heart failure) or lung disease (such as asthma), or if you have a weakened immune system. The very young and very old are also at risk for more serious infections. Bacterial sinusitis, middle ear infections, and bacterial pneumonia can complicate the common cold. The common cold can worsen asthma and chronic obstructive pulmonary disease (COPD). Sometimes, these complications can require emergency medical care and may be life-threatening. PREVENTION  The best way to protect against getting a cold is to practice good hygiene. Avoid oral or hand contact with people with cold symptoms. Wash your hands often if contact occurs. There is  no clear evidence that vitamin C, vitamin E, echinacea, or exercise reduces the chance of developing a  cold. However, it is always recommended to get plenty of rest and practice good nutrition. TREATMENT  Treatment is directed at relieving symptoms. There is no cure. Antibiotics are not effective, because the infection is caused by a virus, not by bacteria. Treatment may include:  Increased fluid intake. Sports drinks offer valuable electrolytes, sugars, and fluids.  Breathing heated mist or steam (vaporizer or shower).  Eating chicken soup or other clear broths, and maintaining good nutrition.  Getting plenty of rest.  Using gargles or lozenges for comfort.  Controlling fevers with ibuprofen or acetaminophen as directed by your caregiver.  Increasing usage of your inhaler if you have asthma. Zinc gel and zinc lozenges, taken in the first 24 hours of the common cold, can shorten the duration and lessen the severity of symptoms. Pain medicines may help with fever, muscle aches, and throat pain. A variety of non-prescription medicines are available to treat congestion and runny nose. Your caregiver can make recommendations and may suggest nasal or lung inhalers for other symptoms.  HOME CARE INSTRUCTIONS   Only take over-the-counter or prescription medicines for pain, discomfort, or fever as directed by your caregiver.  Use a warm mist humidifier or inhale steam from a shower to increase air moisture. This may keep secretions moist and make it easier to breathe.  Drink enough water and fluids to keep your urine clear or pale yellow.  Rest as needed.  Return to work when your temperature has returned to normal or as your caregiver advises. You may need to stay home longer to avoid infecting others. You can also use a face mask and careful hand washing to prevent spread of the virus. SEEK MEDICAL CARE IF:   After the first few days, you feel you are getting worse rather than better.  You need your caregiver's advice about medicines to control symptoms.  You develop chills, worsening  shortness of breath, or brown or red sputum. These may be signs of pneumonia.  You develop yellow or brown nasal discharge or pain in the face, especially when you bend forward. These may be signs of sinusitis.  You develop a fever, swollen neck glands, pain with swallowing, or white areas in the back of your throat. These may be signs of strep throat. SEEK IMMEDIATE MEDICAL CARE IF:   You have a fever.  You develop severe or persistent headache, ear pain, sinus pain, or chest pain.  You develop wheezing, a prolonged cough, cough up blood, or have a change in your usual mucus (if you have chronic lung disease).  You develop sore muscles or a stiff neck. Document Released: 07/24/2000 Document Revised: 04/22/2011 Document Reviewed: 06/01/2010 [facility] Patient Information 2014 [place], [place].

## 2013-04-09 ENCOUNTER — Other Ambulatory Visit (INDEPENDENT_AMBULATORY_CARE_PROVIDER_SITE_OTHER): Payer: BC Managed Care – PPO

## 2013-04-09 DIAGNOSIS — E291 Testicular hypofunction: Secondary | ICD-10-CM

## 2013-04-09 MED ORDER — TESTOSTERONE CYPIONATE 200 MG/ML IM SOLN: 200.0000 mg | INTRAMUSCULAR | Status: DC

## 2013-04-09 MED ORDER — TESTOSTERONE ENANTHATE 200 MG/ML IM SOLN: 200.0000 mg | Freq: Once | INTRAMUSCULAR | Status: DC

## 2013-04-09 MED ORDER — TESTOSTERONE CYPIONATE 200 MG/ML IM SOLN
200.0000 mg | Freq: Once | INTRAMUSCULAR | Status: AC
  Administered 2013-04-09: 200 mg via INTRAMUSCULAR

## 2013-05-08 ENCOUNTER — Ambulatory Visit (INDEPENDENT_AMBULATORY_CARE_PROVIDER_SITE_OTHER): Payer: BC Managed Care – PPO | Admitting: *Deleted

## 2013-05-08 DIAGNOSIS — E291 Testicular hypofunction: Secondary | ICD-10-CM

## 2013-05-08 MED ORDER — TESTOSTERONE CYPIONATE 200 MG/ML IM SOLN
200.0000 mg | Freq: Once | INTRAMUSCULAR | Status: AC
  Administered 2013-05-08: 200 mg via INTRAMUSCULAR

## 2013-05-21 ENCOUNTER — Ambulatory Visit (INDEPENDENT_AMBULATORY_CARE_PROVIDER_SITE_OTHER): Payer: BC Managed Care – PPO | Admitting: Physician Assistant

## 2013-05-21 VITALS — BP 120/86 | HR 102

## 2013-05-21 DIAGNOSIS — E291 Testicular hypofunction: Secondary | ICD-10-CM

## 2013-05-21 MED ORDER — TESTOSTERONE CYPIONATE 200 MG/ML IM SOLN
200.0000 mg | Freq: Once | INTRAMUSCULAR | Status: AC
  Administered 2013-05-21: 200 mg via INTRAMUSCULAR

## 2013-05-21 NOTE — Progress Notes (Signed)
Patient presents here today for bi-weekly testosterone injection.

## 2013-06-18 ENCOUNTER — Ambulatory Visit (INDEPENDENT_AMBULATORY_CARE_PROVIDER_SITE_OTHER): Payer: BC Managed Care – PPO | Admitting: *Deleted

## 2013-06-18 VITALS — BP 122/78 | HR 96 | Resp 16

## 2013-06-18 DIAGNOSIS — E291 Testicular hypofunction: Secondary | ICD-10-CM

## 2013-06-18 MED ORDER — TESTOSTERONE CYPIONATE 200 MG/ML IM SOLN
200.0000 mg | Freq: Once | INTRAMUSCULAR | Status: AC
  Administered 2013-06-18: 200 mg via INTRAMUSCULAR

## 2013-07-16 ENCOUNTER — Ambulatory Visit (INDEPENDENT_AMBULATORY_CARE_PROVIDER_SITE_OTHER): Payer: BC Managed Care – PPO | Admitting: *Deleted

## 2013-07-16 VITALS — BP 106/78 | HR 90

## 2013-07-16 DIAGNOSIS — E291 Testicular hypofunction: Secondary | ICD-10-CM

## 2013-07-16 MED ORDER — TESTOSTERONE CYPIONATE 200 MG/ML IM SOLN
200.0000 mg | Freq: Once | INTRAMUSCULAR | Status: AC
  Administered 2013-07-16: 200 mg via INTRAMUSCULAR

## 2013-07-16 NOTE — Addendum Note (Signed)
Addended by: Thelma Barge D on: 07/16/2013 03:10 PM   Modules accepted: Level of Service

## 2013-08-21 ENCOUNTER — Ambulatory Visit (INDEPENDENT_AMBULATORY_CARE_PROVIDER_SITE_OTHER): Payer: BC Managed Care – PPO | Admitting: *Deleted

## 2013-08-21 DIAGNOSIS — E291 Testicular hypofunction: Secondary | ICD-10-CM

## 2013-08-21 MED ORDER — TESTOSTERONE CYPIONATE 200 MG/ML IM SOLN
200.0000 mg | Freq: Once | INTRAMUSCULAR | Status: AC
  Administered 2013-08-21: 200 mg via INTRAMUSCULAR

## 2013-09-23 ENCOUNTER — Ambulatory Visit (INDEPENDENT_AMBULATORY_CARE_PROVIDER_SITE_OTHER): Payer: BC Managed Care – PPO | Admitting: *Deleted

## 2013-09-23 VITALS — BP 122/82 | HR 91 | Resp 16

## 2013-09-23 DIAGNOSIS — E291 Testicular hypofunction: Secondary | ICD-10-CM

## 2013-09-23 MED ORDER — TESTOSTERONE CYPIONATE 200 MG/ML IM SOLN
200.0000 mg | Freq: Once | INTRAMUSCULAR | Status: AC
  Administered 2013-09-23: 200 mg via INTRAMUSCULAR

## 2013-10-08 ENCOUNTER — Ambulatory Visit (INDEPENDENT_AMBULATORY_CARE_PROVIDER_SITE_OTHER): Payer: BC Managed Care – PPO | Admitting: *Deleted

## 2013-10-08 VITALS — BP 120/82 | HR 90

## 2013-10-08 DIAGNOSIS — E291 Testicular hypofunction: Secondary | ICD-10-CM

## 2013-10-08 MED ORDER — TESTOSTERONE CYPIONATE 200 MG/ML IM SOLN
200.0000 mg | Freq: Once | INTRAMUSCULAR | Status: AC
  Administered 2013-10-08: 200 mg via INTRAMUSCULAR

## 2013-10-08 NOTE — Progress Notes (Deleted)
   Subjective:    Patient ID: Marcus Gutierrez, male    DOB: [birth year], 53 y.o.   MRN: [id number]  HPI  Pt here for testosterone shot  Review of Systems     Objective:   Physical Exam        Assessment & Plan:

## 2013-10-08 NOTE — Progress Notes (Signed)
Pt here for testosterone shot.

## 2013-10-24 ENCOUNTER — Ambulatory Visit (INDEPENDENT_AMBULATORY_CARE_PROVIDER_SITE_OTHER): Payer: BC Managed Care – PPO | Admitting: *Deleted

## 2013-10-24 VITALS — BP 122/80 | HR 78

## 2013-10-24 DIAGNOSIS — E291 Testicular hypofunction: Secondary | ICD-10-CM

## 2013-10-24 MED ORDER — TESTOSTERONE CYPIONATE 200 MG/ML IM SOLN
200.0000 mg | Freq: Once | INTRAMUSCULAR | Status: AC
  Administered 2013-11-05: 200 mg via INTRAMUSCULAR

## 2013-10-24 NOTE — Progress Notes (Signed)
   Subjective:    Patient ID: Marcus Gutierrez, male    DOB: 30-Dec-1960, 53 y.o.   MRN: [id number]  HPI Patient her for testosterone injection   Review of Systems     Objective:   Physical Exam        Assessment & Plan:

## 2013-11-05 ENCOUNTER — Telehealth: Payer: Self-pay | Admitting: *Deleted

## 2013-11-05 ENCOUNTER — Ambulatory Visit (INDEPENDENT_AMBULATORY_CARE_PROVIDER_SITE_OTHER): Payer: BC Managed Care – PPO | Admitting: *Deleted

## 2013-11-05 ENCOUNTER — Other Ambulatory Visit: Payer: Self-pay | Admitting: Emergency Medicine

## 2013-11-05 VITALS — BP 122/88 | HR 75

## 2013-11-05 DIAGNOSIS — E291 Testicular hypofunction: Secondary | ICD-10-CM

## 2013-11-05 MED ORDER — TESTOSTERONE CYPIONATE 200 MG/ML IM SOLN: 200.0000 mg | INTRAMUSCULAR | Status: DC

## 2013-11-05 NOTE — Telephone Encounter (Signed)
Prescription written at desk

## 2013-11-05 NOTE — Telephone Encounter (Signed)
Pt need refills on testosterone cypionate.

## 2013-11-05 NOTE — Progress Notes (Signed)
   Subjective:    Patient ID: Marcus Gutierrez, male    DOB: 12/26/1960, 53 y.o.   MRN: [id number]  HPI  Patient here for testosterone injection  Review of Systems     Objective:   Physical Exam        Assessment & Plan:   

## 2013-11-08 NOTE — Telephone Encounter (Signed)
Faxed

## 2013-12-01 ENCOUNTER — Ambulatory Visit (INDEPENDENT_AMBULATORY_CARE_PROVIDER_SITE_OTHER): Payer: BC Managed Care – PPO | Admitting: *Deleted

## 2013-12-01 VITALS — BP 126/86 | HR 82

## 2013-12-01 DIAGNOSIS — E291 Testicular hypofunction: Secondary | ICD-10-CM

## 2013-12-01 MED ORDER — TESTOSTERONE CYPIONATE 200 MG/ML IM SOLN
200.0000 mg | Freq: Once | INTRAMUSCULAR | Status: AC
  Administered 2013-12-01: 200 mg via INTRAMUSCULAR

## 2013-12-01 NOTE — Progress Notes (Signed)
   Subjective:    Patient ID: Marcus Gutierrez, male    DOB: 03/09/1960, 53 y.o.   MRN: [id number]  HPI  Pt here for bi weekly testosterone injection  Review of Systems     Objective:   Physical Exam        Assessment & Plan:

## 2013-12-17 ENCOUNTER — Ambulatory Visit (INDEPENDENT_AMBULATORY_CARE_PROVIDER_SITE_OTHER): Payer: BC Managed Care – PPO

## 2013-12-17 DIAGNOSIS — E291 Testicular hypofunction: Secondary | ICD-10-CM

## 2013-12-17 MED ORDER — TESTOSTERONE CYPIONATE 200 MG/ML IM SOLN
200.0000 mg | Freq: Once | INTRAMUSCULAR | Status: AC
  Administered 2013-12-17: 200 mg via INTRAMUSCULAR

## 2013-12-22 ENCOUNTER — Ambulatory Visit (INDEPENDENT_AMBULATORY_CARE_PROVIDER_SITE_OTHER): Payer: BC Managed Care – PPO

## 2013-12-22 ENCOUNTER — Ambulatory Visit (INDEPENDENT_AMBULATORY_CARE_PROVIDER_SITE_OTHER): Payer: BC Managed Care – PPO | Admitting: Family Medicine

## 2013-12-22 VITALS — BP 118/70 | HR 92 | Temp 98.4°F | Resp 18 | Ht 70.0 in | Wt 177.0 lb

## 2013-12-22 DIAGNOSIS — S29009A Unspecified injury of muscle and tendon of unspecified wall of thorax, initial encounter: Secondary | ICD-10-CM

## 2013-12-22 DIAGNOSIS — S29019A Strain of muscle and tendon of unspecified wall of thorax, initial encounter: Secondary | ICD-10-CM

## 2013-12-22 DIAGNOSIS — M545 Low back pain, unspecified: Secondary | ICD-10-CM

## 2013-12-22 DIAGNOSIS — S161XXA Strain of muscle, fascia and tendon at neck level, initial encounter: Secondary | ICD-10-CM

## 2013-12-22 MED ORDER — CYCLOBENZAPRINE HCL 10 MG PO TABS: 10.0000 mg | ORAL_TABLET | Freq: Three times a day (TID) | ORAL | Status: DC | PRN

## 2013-12-22 NOTE — Patient Instructions (Signed)
Use the neck collar as needed for pain Use the flexeril as needed for muscle soreness and spasm- remember it will make you sleepy! Please let me know if you are not better in the next few days- Sooner if worse.
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
## 2013-12-24 ENCOUNTER — Encounter: Payer: Self-pay | Admitting: Family Medicine

## 2013-12-24 DIAGNOSIS — M542 Cervicalgia: Secondary | ICD-10-CM

## 2013-12-24 NOTE — Telephone Encounter (Signed)
Called and spoke with him,  He is doing ok but has decided to go ahead with a CT.  Have ordered this, called and asked Britta MccreedyBarbara to call [facility] and try to schedule for this afternoon.  Await result.

## 2013-12-24 NOTE — Telephone Encounter (Signed)
I called and got approval for CT [facility], auth # [id number]. Called [facility] and was advised [address]. Notified pt who agreed to go directly after insurance agent meets him at 3:30. I gave him ph # to call [facility] for closing time. Dr Patsy Lageropland, Lorain ChildesFYI.

## 2013-12-27 ENCOUNTER — Ambulatory Visit
Admission: RE | Admit: 2013-12-27 | Discharge: 2013-12-27 | Disposition: A | Discharge status: Home / Self Care | Payer: BC Managed Care – PPO | Source: Ambulatory Visit | Attending: Family Medicine | Admitting: Family Medicine

## 2013-12-27 DIAGNOSIS — M542 Cervicalgia: Secondary | ICD-10-CM

## 2013-12-28 ENCOUNTER — Ambulatory Visit (INDEPENDENT_AMBULATORY_CARE_PROVIDER_SITE_OTHER): Payer: BC Managed Care – PPO | Admitting: Family Medicine

## 2013-12-28 VITALS — BP 110/74 | HR 92 | Temp 98.2°F | Resp 16 | Ht 69.0 in | Wt 175.0 lb

## 2013-12-28 DIAGNOSIS — S29019D Strain of muscle and tendon of unspecified wall of thorax, subsequent encounter: Secondary | ICD-10-CM

## 2013-12-28 DIAGNOSIS — M542 Cervicalgia: Secondary | ICD-10-CM

## 2013-12-28 DIAGNOSIS — S161XXD Strain of muscle, fascia and tendon at neck level, subsequent encounter: Secondary | ICD-10-CM

## 2013-12-28 DIAGNOSIS — S29009D Unspecified injury of muscle and tendon of unspecified wall of thorax, subsequent encounter: Secondary | ICD-10-CM

## 2013-12-28 NOTE — Patient Instructions (Signed)
I am hopeful that you will be better soon. However if you are getting worse please let me know!
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
## 2013-12-31 ENCOUNTER — Encounter: Payer: Self-pay | Admitting: Family Medicine

## 2014-01-05 ENCOUNTER — Ambulatory Visit (INDEPENDENT_AMBULATORY_CARE_PROVIDER_SITE_OTHER): Payer: BC Managed Care – PPO | Admitting: Emergency Medicine

## 2014-01-05 VITALS — BP 116/82 | HR 91 | Temp 98.8°F | Resp 18 | Ht 70.0 in | Wt 173.0 lb

## 2014-01-05 DIAGNOSIS — R972 Elevated prostate specific antigen [PSA]: Secondary | ICD-10-CM

## 2014-01-05 DIAGNOSIS — E291 Testicular hypofunction: Secondary | ICD-10-CM

## 2014-01-05 MED ORDER — TESTOSTERONE CYPIONATE 200 MG/ML IM SOLN
200.0000 mg | Freq: Once | INTRAMUSCULAR | Status: AC
  Administered 2014-01-05: 200 mg via INTRAMUSCULAR
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
## 2014-01-06 LAB — PSA: PSA: 2.01 ng/mL (ref <=4.00)

## 2014-01-07 LAB — TESTOSTERONE, FREE, TOTAL, SHBG
Sex Hormone Binding: 25 nmol/L (ref 13–71)
Testosterone, Free: 41.7 pg/mL — ABNORMAL LOW (ref 47.0–244.0)
Testosterone-% Free: 2.2 % (ref 1.6–2.9)
Testosterone: 189 ng/dL — ABNORMAL LOW (ref 300–890)

## 2014-01-07 IMAGING — CR DG CERVICAL SPINE COMPLETE 4+V
5 series · 5 of 5 positions shown · non-contrast
Comparison: None.

CLINICAL DATA: Neck pain.

CERVICAL SPINE - COMPLETE 4+ VIEW

[lpo]
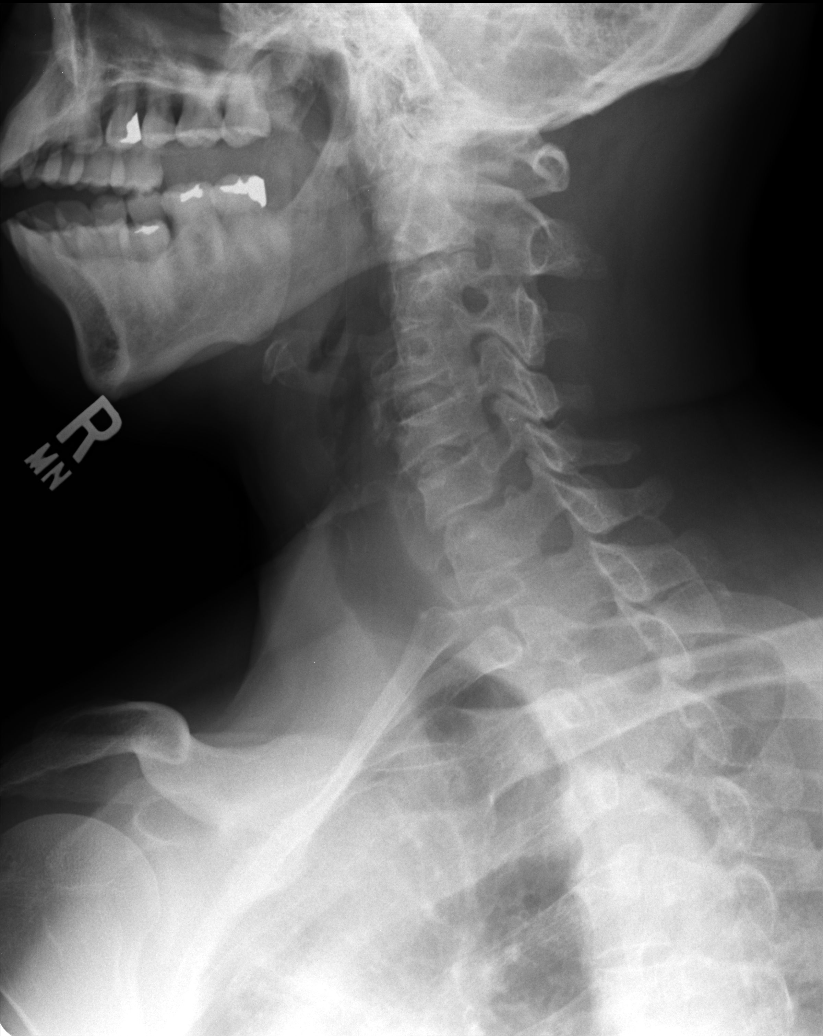

[lateral]
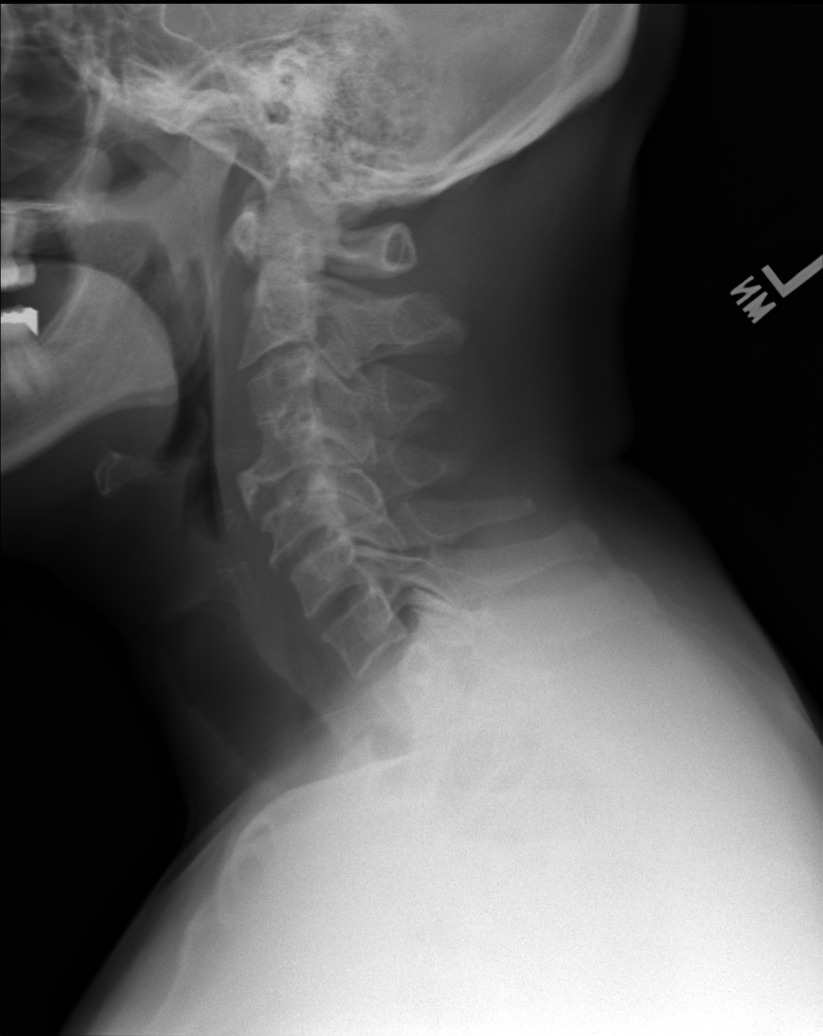

[rpo]
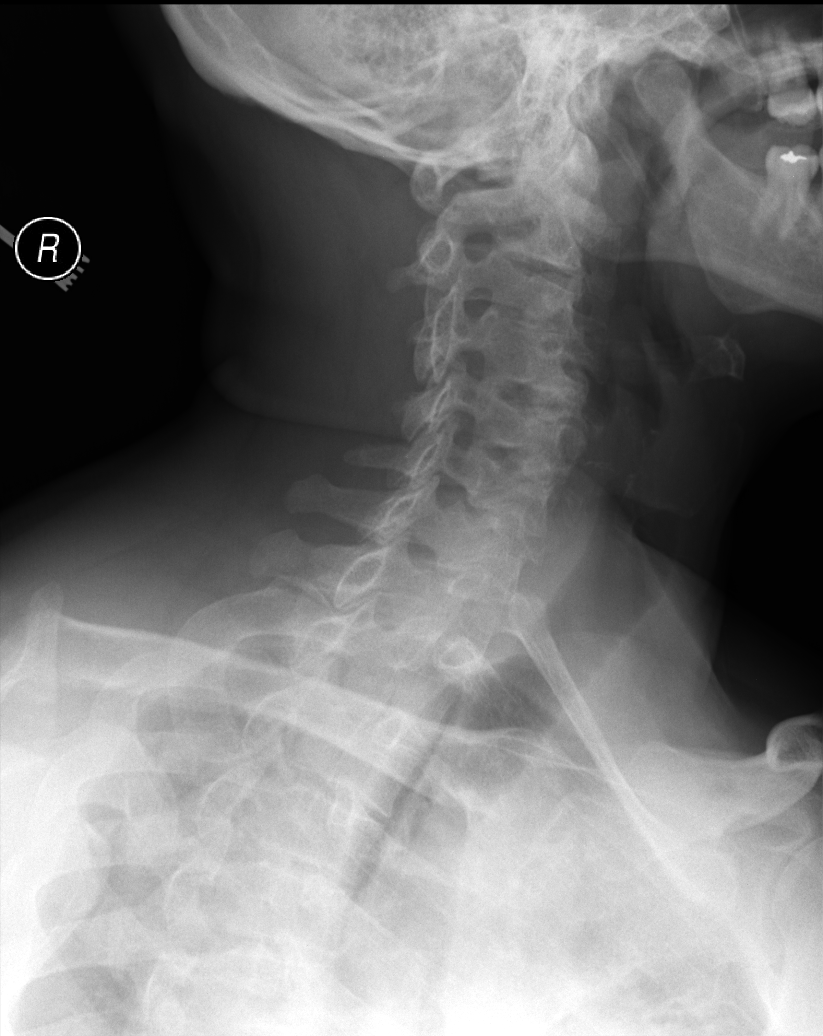

[AP]
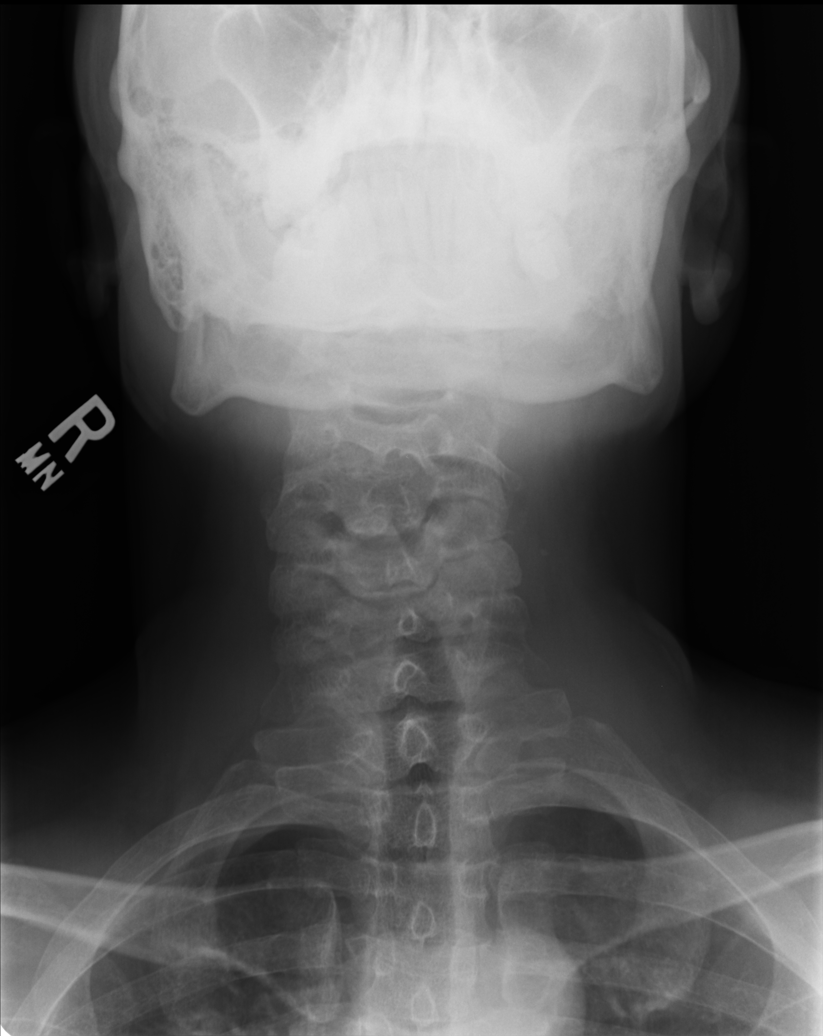

[ap open mouth]
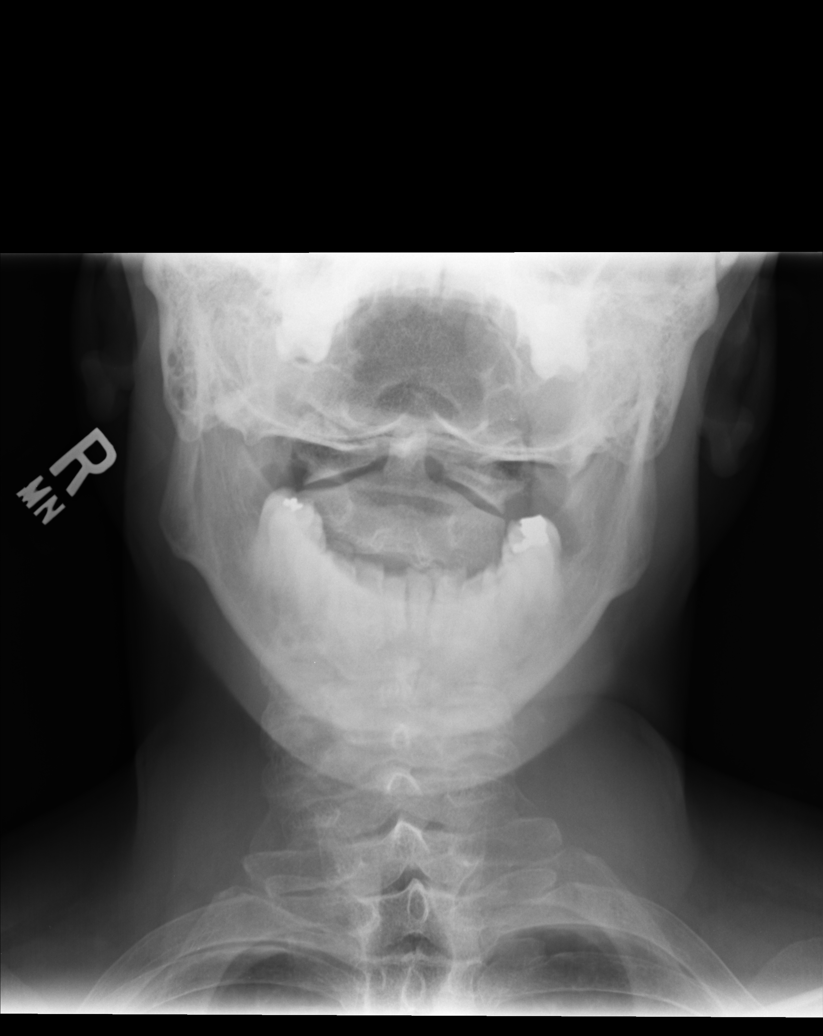

[5 of 5 positions shown; findings below may reference images not displayed]

FINDINGS: The cervical spine is visualized from skull base through
the cervicothoracic junction.  Cervical spine is fused anteriorly
and posteriorly at C3-4.  This may be congenital.  Left osseous
foraminal narrowing is present at C4-5 and C5-6 due to facet
arthropathy.  There is chronic loss of height at C5.  The soft
tissues are unremarkable.
IMPRESSION: 1.  Anterior and posterior fusion at C[DATE] be congenital.
2.  Moderate degenerative changes at C4-5 and C5-6 are worse on the
left.

## 2014-01-07 IMAGING — CR DG KNEE 1-2V*R*
2 series · 2 of 2 positions shown · non-contrast
Comparison: None.

CLINICAL DATA: Fever and myalgia.  Knee pain.

RIGHT KNEE - 1-2 VIEW

[AP]
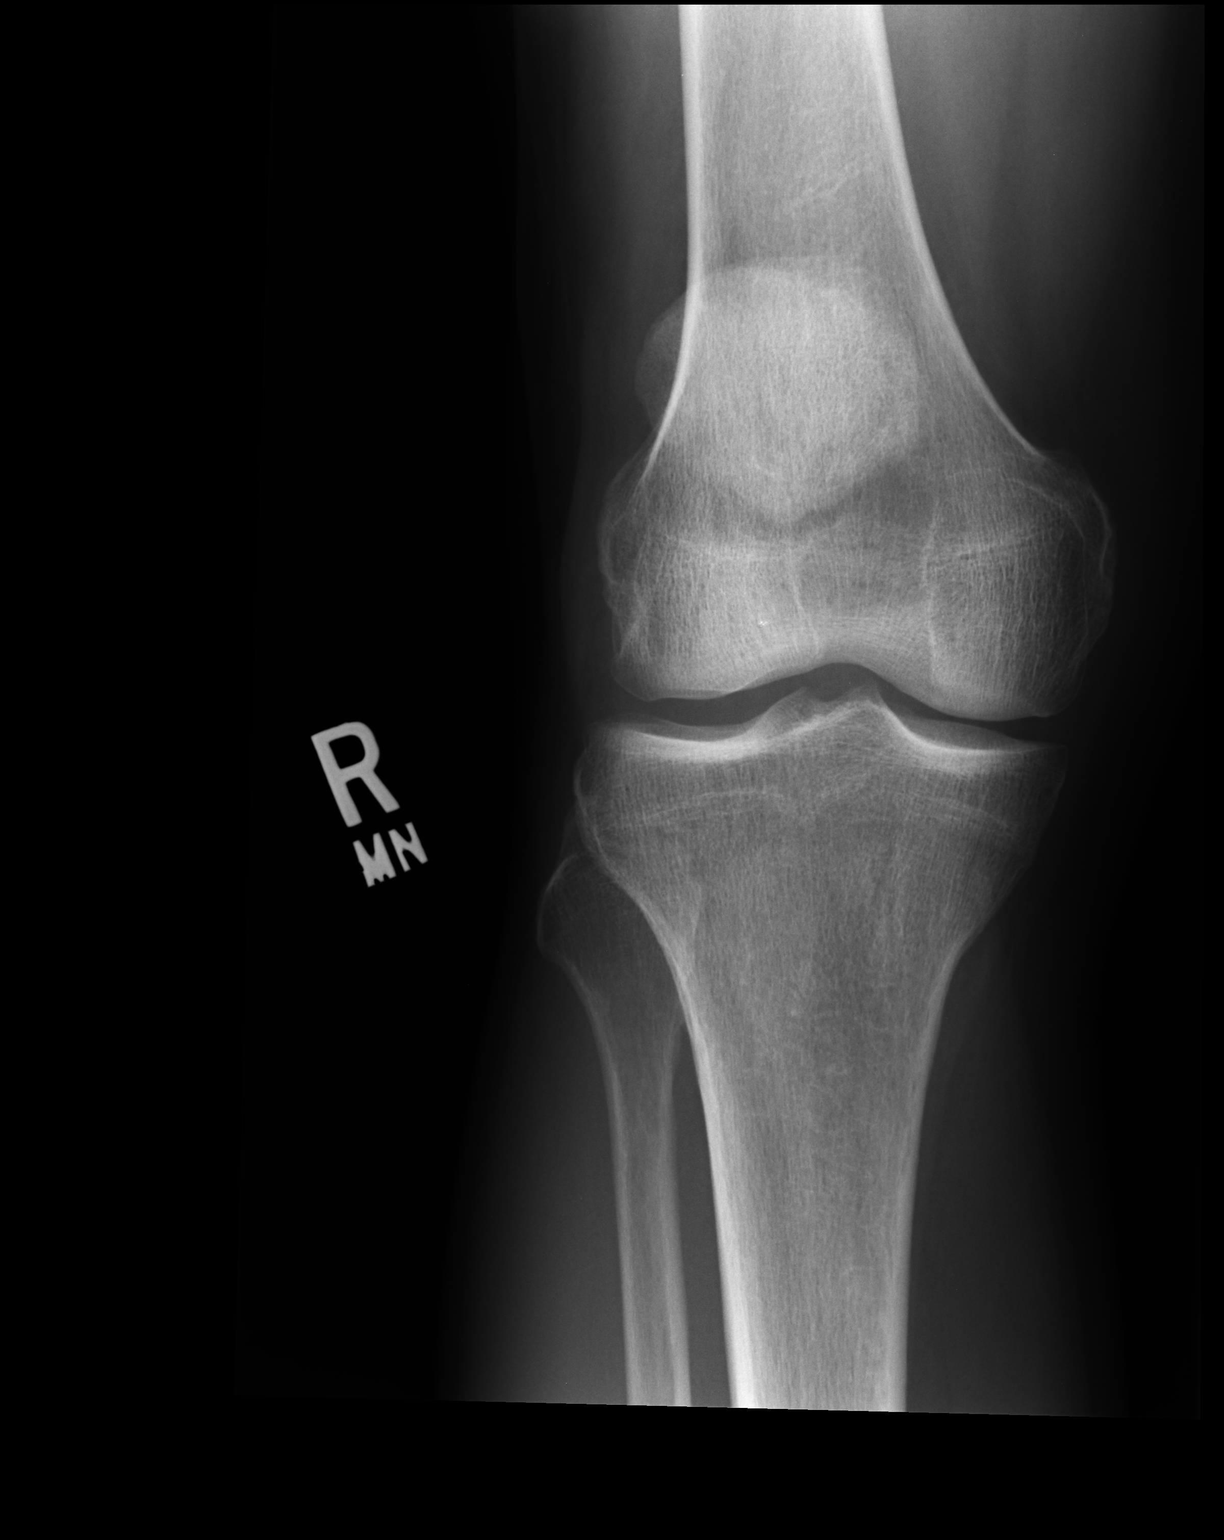

[lateral]
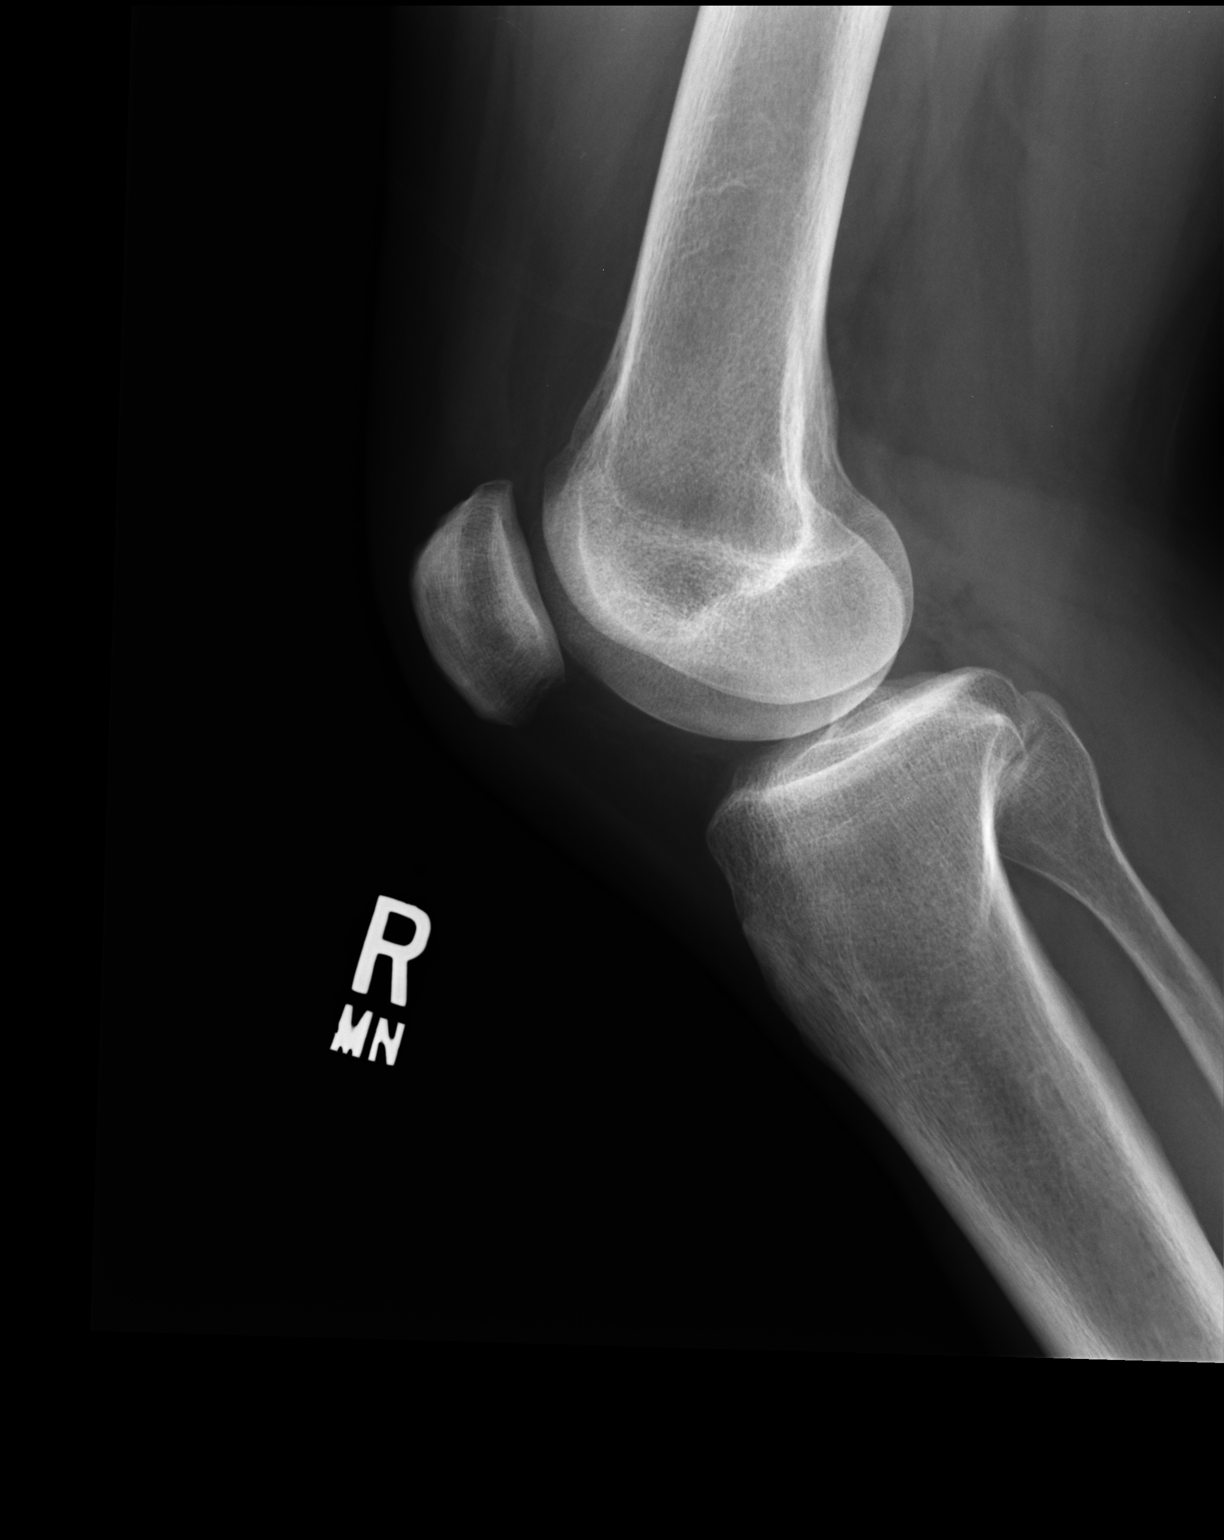

[2 of 2 positions shown; findings below may reference images not displayed]

FINDINGS: The right knee is located.  No acute bone or soft tissue
abnormality is present on these two views.
IMPRESSION: Negative right knee.

Clinically significant discrepancy from primary report, if
provided: None

## 2014-01-12 ENCOUNTER — Encounter: Payer: Self-pay | Admitting: Family Medicine

## 2014-01-13 ENCOUNTER — Encounter: Payer: Self-pay | Admitting: Radiology

## 2014-01-13 ENCOUNTER — Telehealth: Payer: Self-pay | Admitting: Radiology

## 2014-01-13 ENCOUNTER — Telehealth: Payer: Self-pay

## 2014-01-13 ENCOUNTER — Encounter: Payer: Self-pay | Admitting: Physician Assistant

## 2014-01-13 NOTE — Telephone Encounter (Signed)
Patient states he needs advice for what to tell  His employer regarding being out of work. He was seen by PT yesterday and they told him to stay out of work and that they sent [place] their recommendations in their office notes. Based off their recommendations, he needs [place] to write a letter to his employer. PT wants him out of work the rest of the week and modified duty. He needs a call back today because his employer needs this information today. Cb# [phone]

## 2014-01-13 NOTE — Telephone Encounter (Signed)
[facility], they will fax over PT report.  The recommendations listed are: return to work 4 hours a day for the first 2 week, 6 hours a day for the next 2 weeks and then return 8 hour days with modified duty.  She is faxing over report with specifics regarding recommendations.

## 2014-01-14 NOTE — Telephone Encounter (Signed)
Audrey and BeMagda Paganininny LennertSarah Weber took care of the work note.

## 2014-01-15 NOTE — Telephone Encounter (Signed)
Work note written.  See letters

## 2014-01-24 ENCOUNTER — Ambulatory Visit (INDEPENDENT_AMBULATORY_CARE_PROVIDER_SITE_OTHER): Payer: BC Managed Care – PPO

## 2014-01-24 ENCOUNTER — Ambulatory Visit (INDEPENDENT_AMBULATORY_CARE_PROVIDER_SITE_OTHER): Payer: BC Managed Care – PPO | Admitting: Emergency Medicine

## 2014-01-24 ENCOUNTER — Encounter: Payer: Self-pay | Admitting: Emergency Medicine

## 2014-01-24 VITALS — BP 110/84 | HR 91 | Temp 98.7°F | Resp 16 | Ht 68.75 in | Wt 169.2 lb

## 2014-01-24 DIAGNOSIS — R0781 Pleurodynia: Secondary | ICD-10-CM

## 2014-01-24 DIAGNOSIS — Z131 Encounter for screening for diabetes mellitus: Secondary | ICD-10-CM

## 2014-01-24 DIAGNOSIS — Z23 Encounter for immunization: Secondary | ICD-10-CM

## 2014-01-24 DIAGNOSIS — R0789 Other chest pain: Secondary | ICD-10-CM

## 2014-01-24 DIAGNOSIS — Z1329 Encounter for screening for other suspected endocrine disorder: Secondary | ICD-10-CM

## 2014-01-24 DIAGNOSIS — Z13220 Encounter for screening for lipoid disorders: Secondary | ICD-10-CM

## 2014-01-24 DIAGNOSIS — Z136 Encounter for screening for cardiovascular disorders: Secondary | ICD-10-CM

## 2014-01-24 DIAGNOSIS — Z0000 Encounter for general adult medical examination without abnormal findings: Secondary | ICD-10-CM

## 2014-01-24 DIAGNOSIS — E291 Testicular hypofunction: Secondary | ICD-10-CM

## 2014-01-24 LAB — POCT URINALYSIS DIPSTICK
Blood, UA: NEGATIVE
Glucose, UA: NEGATIVE
Ketones, UA: NEGATIVE
Leukocytes, UA: NEGATIVE
Nitrite, UA: NEGATIVE
Protein, UA: NEGATIVE
Spec Grav, UA: 1.015
Urobilinogen, UA: 1
pH, UA: 6

## 2014-01-24 LAB — COMPLETE METABOLIC PANEL WITH GFR
ALT: <8 U/L (ref 0–53)
AST: 12 U/L (ref 0–37)
Albumin: 4.3 g/dL (ref 3.5–5.2)
Alkaline Phosphatase: 74 U/L (ref 39–117)
BUN: 12 mg/dL (ref 6–23)
CO2: 30 mEq/L (ref 19–32)
Calcium: 10.1 mg/dL (ref 8.4–10.5)
Chloride: 100 mEq/L (ref 96–112)
Creat: 1.11 mg/dL (ref 0.50–1.35)
GFR, Est African American: 88 mL/min
GFR, Est Non African American: 76 mL/min
Glucose, Bld: 85 mg/dL (ref 70–99)
Potassium: 4.4 mEq/L (ref 3.5–5.3)
Sodium: 139 mEq/L (ref 135–145)
Total Bilirubin: 0.7 mg/dL (ref 0.2–1.2)
Total Protein: 7.7 g/dL (ref 6.0–8.3)

## 2014-01-24 LAB — CBC WITH DIFFERENTIAL/PLATELET
Basophils Absolute: 0.1 10*3/uL (ref 0.0–0.1)
Basophils Relative: 1 % (ref 0–1)
Eosinophils Absolute: 0.1 10*3/uL (ref 0.0–0.7)
Eosinophils Relative: 1 % (ref 0–5)
HCT: 46.8 % (ref 39.0–52.0)
Hemoglobin: 15.7 g/dL (ref 13.0–17.0)
Lymphocytes Relative: 46 % (ref 12–46)
Lymphs Abs: 3 10*3/uL (ref 0.7–4.0)
MCH: 29 pg (ref 26.0–34.0)
MCHC: 33.5 g/dL (ref 30.0–36.0)
MCV: 86.3 fL (ref 78.0–100.0)
MPV: 11.3 fL (ref 9.4–12.4)
Monocytes Absolute: 0.5 10*3/uL (ref 0.1–1.0)
Monocytes Relative: 8 % (ref 3–12)
Neutro Abs: 2.9 10*3/uL (ref 1.7–7.7)
Neutrophils Relative %: 44 % (ref 43–77)
Platelets: 336 10*3/uL (ref 150–400)
RBC: 5.42 MIL/uL (ref 4.22–5.81)
RDW: 13.7 % (ref 11.5–15.5)
WBC: 6.5 10*3/uL (ref 4.0–10.5)

## 2014-01-24 LAB — LIPID PANEL
Cholesterol: 152 mg/dL (ref 0–200)
HDL: 56 mg/dL (ref >39)
LDL Cholesterol: 85 mg/dL (ref 0–99)
Total CHOL/HDL Ratio: 2.7 Ratio
Triglycerides: 56 mg/dL (ref <150)
VLDL: 11 mg/dL (ref 0–40)

## 2014-01-24 LAB — IFOBT (OCCULT BLOOD): IFOBT: NEGATIVE

## 2014-01-24 LAB — TSH: TSH: 1.129 u[IU]/mL (ref 0.350–4.500)

## 2014-01-24 MED ORDER — TERBINAFINE HCL 250 MG PO TABS: 250.0000 mg | ORAL_TABLET | Freq: Every day | ORAL | Status: DC

## 2014-01-24 MED ORDER — SILDENAFIL CITRATE 100 MG PO TABS
50.0000 mg | ORAL_TABLET | Freq: Every day | ORAL | Status: DC | PRN
Start: 2014-01-24 — End: 2015-07-15
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
## 2014-02-01 ENCOUNTER — Telehealth: Payer: Self-pay | Admitting: *Deleted

## 2014-02-01 ENCOUNTER — Ambulatory Visit (INDEPENDENT_AMBULATORY_CARE_PROVIDER_SITE_OTHER): Payer: BC Managed Care – PPO | Admitting: *Deleted

## 2014-02-01 VITALS — BP 120/86

## 2014-02-01 DIAGNOSIS — E291 Testicular hypofunction: Secondary | ICD-10-CM

## 2014-02-01 MED ORDER — TESTOSTERONE CYPIONATE 200 MG/ML IM SOLN
200.0000 mg | Freq: Once | INTRAMUSCULAR | Status: AC
  Administered 2014-02-01: 200 mg via INTRAMUSCULAR

## 2014-02-01 NOTE — Telephone Encounter (Signed)
Pt needs new rx for smaller bottles of testosterone.

## 2014-02-01 NOTE — Progress Notes (Signed)
   Subjective:    Patient ID: Marcus Gutierrez, male    DOB: Jan 23, 1961, 53 y.o.   MRN: [id number]  HPI  Patient here for testosterone injection  Review of Systems     Objective:   Physical Exam        Assessment & Plan:

## 2014-02-01 NOTE — Telephone Encounter (Signed)
Please send in prescriptions for 1 month supplies of his medications. He can have refills for 6 months

## 2014-02-03 MED ORDER — TESTOSTERONE CYPIONATE 200 MG/ML IM SOLN: 200.0000 mg | INTRAMUSCULAR | Status: DC

## 2014-02-03 NOTE — Telephone Encounter (Signed)
rx sent to pt pharmacy

## 2014-02-14 ENCOUNTER — Ambulatory Visit (INDEPENDENT_AMBULATORY_CARE_PROVIDER_SITE_OTHER): Payer: BC Managed Care – PPO | Admitting: Family Medicine

## 2014-02-14 VITALS — BP 130/80 | HR 93 | Temp 98.2°F | Resp 16 | Ht 68.5 in | Wt 171.0 lb

## 2014-02-14 DIAGNOSIS — M542 Cervicalgia: Secondary | ICD-10-CM

## 2014-02-14 DIAGNOSIS — S161XXD Strain of muscle, fascia and tendon at neck level, subsequent encounter: Secondary | ICD-10-CM
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
## 2014-02-16 ENCOUNTER — Telehealth: Payer: Self-pay | Admitting: Family Medicine

## 2014-02-16 NOTE — Telephone Encounter (Signed)
Darren called today and I spoke with Amy at [facility] PT> they have all the orders that they need and they plan to keep him in PT for another couple of weeks.  Called but no answer for pt- will send mychart

## 2014-03-03 ENCOUNTER — Ambulatory Visit (INDEPENDENT_AMBULATORY_CARE_PROVIDER_SITE_OTHER): Payer: BC Managed Care – PPO | Admitting: *Deleted

## 2014-03-03 VITALS — BP 100/78 | HR 91

## 2014-03-03 DIAGNOSIS — E291 Testicular hypofunction: Secondary | ICD-10-CM

## 2014-03-03 MED ORDER — TESTOSTERONE CYPIONATE 200 MG/ML IM SOLN
200.0000 mg | Freq: Once | INTRAMUSCULAR | Status: AC
  Administered 2014-03-03: 200 mg via INTRAMUSCULAR

## 2014-03-03 NOTE — Progress Notes (Signed)
   Subjective:    Patient ID: Marcus Gutierrez, male    DOB: 04/03/1960, 54 y.o.   MRN: [id number]  HPI Pt here for testosterone injection   Review of Systems     Objective:   Physical Exam        Assessment & Plan:

## 2014-03-11 ENCOUNTER — Ambulatory Visit (INDEPENDENT_AMBULATORY_CARE_PROVIDER_SITE_OTHER): Payer: BC Managed Care – PPO | Admitting: Emergency Medicine

## 2014-03-11 ENCOUNTER — Encounter: Payer: Self-pay | Admitting: Emergency Medicine

## 2014-03-11 VITALS — BP 118/70 | HR 98 | Temp 98.1°F | Resp 16 | Ht 69.5 in | Wt 175.0 lb

## 2014-03-11 DIAGNOSIS — M542 Cervicalgia: Secondary | ICD-10-CM

## 2014-03-11 NOTE — Progress Notes (Signed)
Subjective:  This chart was scribed for [name] MD,  by [name],scribe, at [facility].  This patient was seen in room 10 and the patient's care was started at 4:53 PM.    Patient ID: Marcus Gutierrez, male    DOB: 12/07/1960, 54 y.o.   MRN: [id number]  HPI  HPI Comments: Marcus Gutierrez is a 54 y.o. male who [facility] for left side neck pain which radiates to his shoulder.  Patient notes he is not ready to be released [facility].  Notes the therapy has helped with his right side pain and says his range of motion has improved considerably with the traction therapy and various exercises.  Denies arm weakness.        Patient Active Problem List   Diagnosis Date Noted  . Colitis 12/11/2011  . Hypogonadism male 11/29/2011   Past Medical History  Diagnosis Date  . Allergy   . GERD (gastroesophageal reflux disease)   . Seizures   . Ulcer   . Ulcerative colitis    Past Surgical History  Procedure Laterality Date  . Hernia repair     Allergies  Allergen Reactions  . Amoxicillin Other (See Comments)    hematochezia  . Androgel [Testosterone]    Prior to Admission medications   Medication Sig Start Date End Date Taking? Authorizing Provider  acetaminophen (TYLENOL) 500 MG tablet Take 500 mg by mouth every 6 (six) hours as needed.   Yes Historical Provider, MD  alum hydroxide-mag trisilicate (GAVISCON) 80-20 MG CHEW chewable tablet Chew by mouth.   Yes Historical Provider, MD  Cholecalciferol (VITAMIN D3) 1000 UNITS CAPS Take 1 capsule by mouth daily.   Yes Historical Provider, MD  cyclobenzaprine (FLEXERIL) 10 MG tablet Take 1 tablet (10 mg total) by mouth 3 (three) times daily as needed for muscle spasms. 12/22/13  Yes Gwenlyn FoundJessica C Copland, MD  dextromethorphan-guaiFENesin (MUCINEX DM) 30-600 MG per 12 hr tablet Take 1 tablet by mouth 2 (two) times daily as needed for cough. 02/23/13   Yes Gwenlyn FoundJessica C Copland, MD  esomeprazole (NEXIUM) 40 MG capsule Take 1 capsule (40 mg total) by mouth daily before breakfast. 03/22/11  Yes Collene GobbleSteven A Mattheo Swindle, MD  fluticasone (FLONASE) 50 MCG/ACT nasal spray Place 2 sprays into both nostrils daily. 02/23/13  Yes Gwenlyn FoundJessica C Copland, MD  mesalamine (LIALDA) 1.2 G EC tablet Take 1,200 mg by mouth daily with breakfast.   Yes Historical Provider, MD  Olopatadine HCl (PATADAY) 0.2 % SOLN Apply to eye.   Yes Historical Provider, MD  sildenafil (VIAGRA) 100 MG tablet Take 0.5-1 tablets (50-100 mg total) by mouth daily as needed for erectile dysfunction. 01/24/14  Yes Collene GobbleSteven A Ariauna Farabee, MD  terbinafine (LAMISIL) 250 MG tablet Take 1 tablet (250 mg total) by mouth daily. 01/24/14  Yes Collene GobbleSteven A Huston Stonehocker, MD  testosterone cypionate (DEPOTESTOTERONE CYPIONATE) 200 MG/ML injection Inject 1 mL (200 mg total) into the muscle every 14 (fourteen) days. 02/03/14  Yes Collene GobbleSteven A Hassaan Crite, MD   History   Social History  . Marital Status: Single    Spouse Name: N/A    Number of Children: N/A  . Years of Education: N/A   Occupational History  . Not on file.   Social History Main Topics  . Smoking status: Former Smoker    Quit date: 06/01/2003  . Smokeless tobacco: Not on file  . Alcohol Use: No  . Drug  Use: No  . Sexual Activity: Not on file   Other Topics Concern  . Not on file   Social History Narrative        Review of Systems  Constitutional: Negative for fever and chills.  Musculoskeletal: Positive for neck pain.       Objective:   Physical Exam  CONSTITUTIONAL: Well developed/well nourished HEAD: Normocephalic/atraumatic EYES: EOMI/PERRL ENMT: Mucous membranes moist NECK: He is tender left trapezius. He has pain with extension of the neck and turning to right and left. SPINE/BACK:entire spine nontender CV: S1/S2 noted, no murmurs/rubs/gallops noted LUNGS: Lungs are clear to auscultation bilaterally, no apparent distress ABDOMEN: soft, nontender, no  rebound or guarding, bowel sounds noted throughout abdomen GU:no cva tenderness NEURO: Pt is awake/alert/appropriate, moves all extremitiesx4.  No facial droop.   EXTREMITIES: pulses normal/equal, full ROM.  He has no focal weakness of upper and lower extremeties.   SKIN: warm, color normal PSYCH: no abnormalities of mood noted, alert and oriented to situation      Filed Vitals:   03/11/14 1616  BP: 118/70  Pulse: 98  Temp: 98.1 F (36.7 C)  Resp: 16  Height: 5' 9.5" (1.765 m)  Weight: 175 lb (79.379 kg)  SpO2: 99%       Assessment & Plan:  Patient has underlying multilevel cervical disc disease. He has persistent radicular symptoms left superior shoulder and the left trapezius area. I referred patient back to physical therapy for the next month. I would like them to try until pheresis or TENS unit. I also suggested we get him referred to Dr. Genene Churn to consider chiropractic treatment.I personally performed the services described in this documentation, which was scribed in my presence. The recorded information has been reviewed and is accurate.

## 2014-03-13 ENCOUNTER — Other Ambulatory Visit: Payer: Self-pay | Admitting: Emergency Medicine

## 2014-03-13 DIAGNOSIS — M5010 Cervical disc disorder with radiculopathy, unspecified cervical region: Secondary | ICD-10-CM

## 2014-03-16 ENCOUNTER — Telehealth: Payer: Self-pay

## 2014-03-16 NOTE — Telephone Encounter (Signed)
Patient needs information regarding his treatment.   [phone]

## 2014-04-01 ENCOUNTER — Ambulatory Visit (INDEPENDENT_AMBULATORY_CARE_PROVIDER_SITE_OTHER): Payer: BC Managed Care – PPO | Admitting: *Deleted

## 2014-04-01 DIAGNOSIS — E291 Testicular hypofunction: Secondary | ICD-10-CM

## 2014-04-01 MED ORDER — TESTOSTERONE CYPIONATE 200 MG/ML IM SOLN
200.0000 mg | Freq: Once | INTRAMUSCULAR | Status: AC
  Administered 2014-04-01: 200 mg via INTRAMUSCULAR

## 2014-04-01 NOTE — Progress Notes (Signed)
   Subjective:    Patient ID: Marcus Gutierrez, male    DOB: 11/24/1960, 53 y.o.   MRN: [id number]  HPI  Patient here for testosterone injection  Review of Systems     Objective:   Physical Exam        Assessment & Plan:   

## 2014-04-06 NOTE — Telephone Encounter (Signed)
Attempted to call pt. Unable to leave VM; pt's VM box was full.

## 2014-04-13 ENCOUNTER — Ambulatory Visit (INDEPENDENT_AMBULATORY_CARE_PROVIDER_SITE_OTHER): Payer: BC Managed Care – PPO | Admitting: Family Medicine

## 2014-04-13 VITALS — BP 106/76 | HR 88 | Temp 97.9°F | Resp 12 | Ht 68.5 in | Wt 165.2 lb

## 2014-04-13 DIAGNOSIS — M542 Cervicalgia: Secondary | ICD-10-CM

## 2014-04-13 NOTE — Patient Instructions (Signed)
Continue chiropractic care, same work hours for now - make sure you are stretching your neck and moving neck throughout the day.  Follow up with Dr. Cleta Albertsaub in 1 month. Return to the clinic or go to the nearest emergency room if any of your symptoms worsen or new symptoms occur.
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
## 2014-04-14 ENCOUNTER — Ambulatory Visit (INDEPENDENT_AMBULATORY_CARE_PROVIDER_SITE_OTHER): Payer: BC Managed Care – PPO | Admitting: *Deleted

## 2014-04-14 VITALS — BP 118/80

## 2014-04-14 DIAGNOSIS — E291 Testicular hypofunction: Secondary | ICD-10-CM

## 2014-04-14 MED ORDER — TESTOSTERONE CYPIONATE 200 MG/ML IM SOLN
200.0000 mg | Freq: Once | INTRAMUSCULAR | Status: AC
  Administered 2014-04-14: 200 mg via INTRAMUSCULAR

## 2014-04-14 NOTE — Progress Notes (Signed)
   Subjective:    Patient ID: Marcus Gutierrez, male    DOB: 12/25/1960, 54 y.o.   MRN: [id number]  HPI  Patient her for testosterone injection  Review of Systems     Objective:   Physical Exam        Assessment & Plan:

## 2014-04-29 ENCOUNTER — Ambulatory Visit (INDEPENDENT_AMBULATORY_CARE_PROVIDER_SITE_OTHER): Payer: BC Managed Care – PPO | Admitting: *Deleted

## 2014-04-29 VITALS — BP 120/86

## 2014-04-29 DIAGNOSIS — E291 Testicular hypofunction: Secondary | ICD-10-CM

## 2014-04-29 MED ORDER — TESTOSTERONE CYPIONATE 200 MG/ML IM SOLN
200.0000 mg | Freq: Once | INTRAMUSCULAR | Status: AC
  Administered 2014-04-29: 200 mg via INTRAMUSCULAR

## 2014-04-29 NOTE — Progress Notes (Signed)
   Subjective:    Patient ID: Marcus Gutierrez, male    DOB: [birth year], 54 y.o.   MRN: [id number]  HPI  Patient here for testosterone injection  Review of Systems     Objective:   Physical Exam        Assessment & Plan:

## 2014-05-11 NOTE — Progress Notes (Signed)
Left message with daughter for him to give us a call back to make an appt with Dr Cleta Albertsdaub

## 2014-05-13 ENCOUNTER — Ambulatory Visit (INDEPENDENT_AMBULATORY_CARE_PROVIDER_SITE_OTHER): Payer: BC Managed Care – PPO | Admitting: *Deleted

## 2014-05-13 ENCOUNTER — Ambulatory Visit (INDEPENDENT_AMBULATORY_CARE_PROVIDER_SITE_OTHER): Payer: BC Managed Care – PPO | Admitting: Family Medicine

## 2014-05-13 VITALS — BP 129/85 | HR 82

## 2014-05-13 VITALS — BP 128/76 | HR 79 | Temp 98.1°F | Resp 14 | Ht 68.5 in | Wt 166.6 lb

## 2014-05-13 DIAGNOSIS — E291 Testicular hypofunction: Secondary | ICD-10-CM

## 2014-05-13 DIAGNOSIS — S161XXD Strain of muscle, fascia and tendon at neck level, subsequent encounter: Secondary | ICD-10-CM | POA: Diagnosis not present

## 2014-05-13 MED ORDER — TESTOSTERONE CYPIONATE 200 MG/ML IM SOLN
200.0000 mg | Freq: Once | INTRAMUSCULAR | Status: AC
  Administered 2014-05-13: 200 mg via INTRAMUSCULAR

## 2014-05-13 NOTE — Addendum Note (Signed)
Addended by: Thelma BargeICHARDSON, Aragorn Recker D on: 05/13/2014 04:57 PM   Modules accepted: Level of Service

## 2014-05-13 NOTE — Progress Notes (Signed)
   Subjective:    Patient ID: Marcus Gutierrez, male    DOB: 04/06/1960, 53 y.o.   MRN: [id number]  HPI  Patient here for testosterone injection  Review of Systems     Objective:   Physical Exam        Assessment & Plan:   
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
## 2014-05-13 NOTE — Patient Instructions (Signed)
We can keep you on 6 hour work days for the time being- please come and see me after you finish your chiropractic treatments

## 2014-05-18 ENCOUNTER — Encounter: Payer: Self-pay | Admitting: Family Medicine

## 2014-05-18 NOTE — Progress Notes (Signed)
Sent letter about an appointment

## 2014-05-23 ENCOUNTER — Other Ambulatory Visit: Payer: Self-pay | Admitting: Emergency Medicine

## 2014-05-24 NOTE — Telephone Encounter (Signed)
In general 3 months is the max out treat with terbinafine

## 2014-05-24 NOTE — Telephone Encounter (Signed)
Dr Cleta Albertsaub, do you want to give pt more RFs?

## 2014-06-03 ENCOUNTER — Ambulatory Visit (INDEPENDENT_AMBULATORY_CARE_PROVIDER_SITE_OTHER): Payer: BC Managed Care – PPO | Admitting: *Deleted

## 2014-06-03 VITALS — BP 118/80

## 2014-06-03 DIAGNOSIS — E291 Testicular hypofunction: Secondary | ICD-10-CM | POA: Diagnosis not present

## 2014-06-03 MED ORDER — TESTOSTERONE CYPIONATE 200 MG/ML IM SOLN
200.0000 mg | Freq: Once | INTRAMUSCULAR | Status: AC
  Administered 2014-06-03: 200 mg via INTRAMUSCULAR

## 2014-06-03 NOTE — Progress Notes (Signed)
   Subjective:    Patient ID: Marcus Gutierrez, male    DOB: 10/12/1960, 53 y.o.   MRN: [id number]  HPI  Patient here for testosterone injection  Review of Systems     Objective:   Physical Exam        Assessment & Plan:   

## 2014-06-19 ENCOUNTER — Ambulatory Visit (INDEPENDENT_AMBULATORY_CARE_PROVIDER_SITE_OTHER): Payer: BC Managed Care – PPO | Admitting: Emergency Medicine

## 2014-06-19 VITALS — BP 118/84 | HR 98 | Temp 98.4°F | Resp 16 | Ht 68.5 in | Wt 162.8 lb

## 2014-06-19 DIAGNOSIS — M542 Cervicalgia: Secondary | ICD-10-CM
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
## 2014-06-20 ENCOUNTER — Ambulatory Visit (INDEPENDENT_AMBULATORY_CARE_PROVIDER_SITE_OTHER): Payer: BC Managed Care – PPO | Admitting: *Deleted

## 2014-06-20 DIAGNOSIS — E291 Testicular hypofunction: Secondary | ICD-10-CM

## 2014-06-20 MED ORDER — TESTOSTERONE CYPIONATE 200 MG/ML IM SOLN
200.0000 mg | Freq: Once | INTRAMUSCULAR | Status: AC
  Administered 2014-06-20: 200 mg via INTRAMUSCULAR

## 2014-06-20 NOTE — Progress Notes (Signed)
   Subjective:    Patient ID: Marcus Gutierrez, male    DOB: 07-25-1960, 54 y.o.   MRN: [id number]  HPI Patient here for testosterone injection   Review of Systems     Objective:   Physical Exam        Assessment & Plan:

## 2014-07-06 ENCOUNTER — Other Ambulatory Visit: Payer: Self-pay | Admitting: Emergency Medicine

## 2014-07-08 ENCOUNTER — Ambulatory Visit (INDEPENDENT_AMBULATORY_CARE_PROVIDER_SITE_OTHER): Payer: BC Managed Care – PPO | Admitting: *Deleted

## 2014-07-08 DIAGNOSIS — E291 Testicular hypofunction: Secondary | ICD-10-CM

## 2014-07-08 MED ORDER — TESTOSTERONE CYPIONATE 200 MG/ML IM SOLN
200.0000 mg | Freq: Once | INTRAMUSCULAR | Status: AC
  Administered 2014-07-08: 200 mg via INTRAMUSCULAR

## 2014-07-08 NOTE — Progress Notes (Signed)
   Subjective:    Patient ID: Marcus Gutierrez, male    DOB: July 27, 1960, 54 y.o.   MRN: [id number]  HPI Patient here for testosterone injection   Review of Systems     Objective:   Physical Exam        Assessment & Plan:

## 2014-07-21 ENCOUNTER — Ambulatory Visit (INDEPENDENT_AMBULATORY_CARE_PROVIDER_SITE_OTHER): Payer: BC Managed Care – PPO | Admitting: *Deleted

## 2014-07-21 VITALS — BP 106/71

## 2014-07-21 DIAGNOSIS — E291 Testicular hypofunction: Secondary | ICD-10-CM | POA: Diagnosis not present

## 2014-07-21 MED ORDER — TESTOSTERONE CYPIONATE 200 MG/ML IM SOLN
200.0000 mg | Freq: Once | INTRAMUSCULAR | Status: AC
  Administered 2014-07-21: 200 mg via INTRAMUSCULAR

## 2014-07-21 NOTE — Progress Notes (Signed)
   Subjective:    Patient ID: Marcus Gutierrez, male    DOB: 1960/12/23, 54 y.o.   MRN: [id number]  HPI  Patient here for testosterone injection  Review of Systems     Objective:   Physical Exam        Assessment & Plan:

## 2014-08-12 ENCOUNTER — Ambulatory Visit (INDEPENDENT_AMBULATORY_CARE_PROVIDER_SITE_OTHER): Payer: BC Managed Care – PPO | Admitting: *Deleted

## 2014-08-12 VITALS — BP 120/80

## 2014-08-12 DIAGNOSIS — E291 Testicular hypofunction: Secondary | ICD-10-CM

## 2014-08-12 MED ORDER — TESTOSTERONE CYPIONATE 200 MG/ML IM SOLN
200.0000 mg | Freq: Once | INTRAMUSCULAR | Status: AC
  Administered 2014-08-12: 200 mg via INTRAMUSCULAR

## 2014-08-12 NOTE — Progress Notes (Signed)
   Subjective:    Patient ID: Marcus Gutierrez, male    DOB: May 13, 1960, 54 y.o.   MRN: [id number]  HPI Patient here for testosterone injection   Review of Systems     Objective:   Physical Exam        Assessment & Plan:

## 2014-08-15 ENCOUNTER — Ambulatory Visit (INDEPENDENT_AMBULATORY_CARE_PROVIDER_SITE_OTHER): Payer: BC Managed Care – PPO | Admitting: Emergency Medicine

## 2014-08-15 ENCOUNTER — Other Ambulatory Visit: Payer: Self-pay | Admitting: Emergency Medicine

## 2014-08-15 DIAGNOSIS — M542 Cervicalgia: Secondary | ICD-10-CM

## 2014-08-15 DIAGNOSIS — F329 Major depressive disorder, single episode, unspecified: Secondary | ICD-10-CM | POA: Diagnosis not present

## 2014-08-15 DIAGNOSIS — B351 Tinea unguium: Secondary | ICD-10-CM

## 2014-08-15 DIAGNOSIS — R000 Tachycardia, unspecified: Secondary | ICD-10-CM

## 2014-08-15 DIAGNOSIS — Z79899 Other long term (current) drug therapy: Secondary | ICD-10-CM | POA: Diagnosis not present

## 2014-08-15 DIAGNOSIS — G8929 Other chronic pain: Secondary | ICD-10-CM | POA: Diagnosis not present

## 2014-08-15 DIAGNOSIS — F32A Depression, unspecified: Secondary | ICD-10-CM

## 2014-08-15 LAB — COMPLETE METABOLIC PANEL WITH GFR
ALT: <8 U/L (ref 0–53)
AST: 11 U/L (ref 0–37)
Albumin: 4 g/dL (ref 3.5–5.2)
Alkaline Phosphatase: 83 U/L (ref 39–117)
BUN: 12 mg/dL (ref 6–23)
CO2: 24 mEq/L (ref 19–32)
Calcium: 9 mg/dL (ref 8.4–10.5)
Chloride: 103 mEq/L (ref 96–112)
Creat: 1.02 mg/dL (ref 0.50–1.35)
GFR, Est African American: >89 mL/min
GFR, Est Non African American: 84 mL/min
Glucose, Bld: 92 mg/dL (ref 70–99)
Potassium: 4.3 mEq/L (ref 3.5–5.3)
Sodium: 137 mEq/L (ref 135–145)
Total Bilirubin: 0.4 mg/dL (ref 0.2–1.2)
Total Protein: 7.2 g/dL (ref 6.0–8.3)

## 2014-08-15 LAB — POCT CBC
Granulocyte percent: 49.4 %G (ref 37–80)
HCT, POC: 44.3 % (ref 43.5–53.7)
Hemoglobin: 14.6 g/dL (ref 14.1–18.1)
Lymph, poc: 3 (ref 0.6–3.4)
MCH, POC: 28 pg (ref 27–31.2)
MCHC: 33 g/dL (ref 31.8–35.4)
MCV: 84.9 fL (ref 80–97)
MID (cbc): 0.4 (ref 0–0.9)
MPV: 8.5 fL (ref 0–99.8)
POC Granulocyte: 3.3 (ref 2–6.9)
POC LYMPH PERCENT: 44.8 %L (ref 10–50)
POC MID %: 5.8 %M (ref 0–12)
Platelet Count, POC: 340 10*3/uL (ref 142–424)
RBC: 5.21 M/uL (ref 4.69–6.13)
RDW, POC: 16.1 %
WBC: 6.6 10*3/uL (ref 4.6–10.2)

## 2014-08-15 LAB — TSH: TSH: 1.298 u[IU]/mL (ref 0.350–4.500)

## 2014-08-15 MED ORDER — TERBINAFINE HCL 250 MG PO TABS: 250.0000 mg | ORAL_TABLET | Freq: Every day | ORAL | Status: DC
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
## 2014-08-18 LAB — TESTOSTERONE, FREE, TOTAL, SHBG
Sex Hormone Binding: 27 nmol/L (ref 10–50)
Testosterone, Free: 326.4 pg/mL — ABNORMAL HIGH (ref 47.0–244.0)
Testosterone-% Free: 2.8 % (ref 1.6–2.9)
Testosterone: 1179 ng/dL — ABNORMAL HIGH (ref 300–890)

## 2014-09-15 ENCOUNTER — Telehealth: Payer: Self-pay

## 2014-09-15 NOTE — Telephone Encounter (Signed)
Resent the release along with the itemized bill to [employer] firm.

## 2014-09-15 NOTE — Telephone Encounter (Signed)
Thomasenia Bottoms called in asking for an itemized bill for this patient's OV from 12/2013 until present. Along with most recent OV notes to be faxed to him at [phone]. Waiting on bill from [place] before sending.

## 2014-09-16 ENCOUNTER — Ambulatory Visit (INDEPENDENT_AMBULATORY_CARE_PROVIDER_SITE_OTHER): Payer: BC Managed Care – PPO | Admitting: *Deleted

## 2014-09-16 ENCOUNTER — Other Ambulatory Visit: Payer: Self-pay | Admitting: *Deleted

## 2014-09-16 VITALS — BP 120/80

## 2014-09-16 DIAGNOSIS — E291 Testicular hypofunction: Secondary | ICD-10-CM

## 2014-09-16 MED ORDER — TESTOSTERONE CYPIONATE 200 MG/ML IM SOLN
200.0000 mg | Freq: Once | INTRAMUSCULAR | Status: AC
  Administered 2014-09-16: 200 mg via INTRAMUSCULAR

## 2014-09-16 NOTE — Progress Notes (Signed)
   Subjective:    Patient ID: Marcus Gutierrez, male    DOB: Nov 10, 1960, 54 y.o.   MRN: [id number]  HPI Patient here for testosterone injection   Review of Systems     Objective:   Physical Exam        Assessment & Plan:

## 2014-09-23 ENCOUNTER — Other Ambulatory Visit (INDEPENDENT_AMBULATORY_CARE_PROVIDER_SITE_OTHER): Payer: BC Managed Care – PPO | Admitting: Family Medicine

## 2014-09-23 DIAGNOSIS — E291 Testicular hypofunction: Secondary | ICD-10-CM | POA: Diagnosis not present

## 2014-09-26 ENCOUNTER — Encounter: Payer: Self-pay | Admitting: Family Medicine

## 2014-09-26 LAB — TESTOSTERONE, FREE, TOTAL, SHBG
Sex Hormone Binding: 24 nmol/L (ref 10–50)
Testosterone, Free: 201.4 pg/mL (ref 47.0–244.0)
Testosterone-% Free: 2.7 % (ref 1.6–2.9)
Testosterone: 757 ng/dL (ref 300–890)

## 2014-09-29 ENCOUNTER — Other Ambulatory Visit: Payer: BC Managed Care – PPO | Admitting: *Deleted

## 2014-09-29 ENCOUNTER — Ambulatory Visit (INDEPENDENT_AMBULATORY_CARE_PROVIDER_SITE_OTHER): Payer: BC Managed Care – PPO | Admitting: *Deleted

## 2014-09-29 VITALS — BP 112/64 | HR 87

## 2014-09-29 DIAGNOSIS — E291 Testicular hypofunction: Secondary | ICD-10-CM | POA: Diagnosis not present

## 2014-09-29 MED ORDER — TESTOSTERONE CYPIONATE 200 MG/ML IM SOLN
200.0000 mg | Freq: Once | INTRAMUSCULAR | Status: AC
  Administered 2014-09-29: 200 mg via INTRAMUSCULAR

## 2014-10-18 ENCOUNTER — Ambulatory Visit (INDEPENDENT_AMBULATORY_CARE_PROVIDER_SITE_OTHER): Payer: BC Managed Care – PPO | Admitting: *Deleted

## 2014-10-18 DIAGNOSIS — Z23 Encounter for immunization: Secondary | ICD-10-CM

## 2014-10-18 DIAGNOSIS — E291 Testicular hypofunction: Secondary | ICD-10-CM | POA: Diagnosis not present

## 2014-10-18 MED ORDER — TESTOSTERONE CYPIONATE 200 MG/ML IM SOLN
200.0000 mg | Freq: Once | INTRAMUSCULAR | Status: AC
  Administered 2014-10-18: 200 mg via INTRAMUSCULAR

## 2014-10-18 NOTE — Progress Notes (Signed)
   Subjective:    Patient ID: Marcus Gutierrez, male    DOB: 07/19/1960, 54 y.o.   MRN: [id number]  HPI  Patient here for testosterone injection  Review of Systems     Objective:   Physical Exam        Assessment & Plan:   

## 2014-10-31 ENCOUNTER — Other Ambulatory Visit (INDEPENDENT_AMBULATORY_CARE_PROVIDER_SITE_OTHER): Payer: BC Managed Care – PPO | Admitting: *Deleted

## 2014-10-31 VITALS — BP 107/75 | HR 96

## 2014-10-31 DIAGNOSIS — E291 Testicular hypofunction: Secondary | ICD-10-CM | POA: Diagnosis not present

## 2014-10-31 MED ORDER — TESTOSTERONE CYPIONATE 200 MG/ML IM SOLN
200.0000 mg | Freq: Once | INTRAMUSCULAR | Status: AC
  Administered 2014-10-31: 200 mg via INTRAMUSCULAR

## 2014-10-31 NOTE — Progress Notes (Signed)
Patient here for testosterone injection

## 2014-11-15 ENCOUNTER — Ambulatory Visit (INDEPENDENT_AMBULATORY_CARE_PROVIDER_SITE_OTHER): Payer: BC Managed Care – PPO | Admitting: *Deleted

## 2014-11-15 DIAGNOSIS — E291 Testicular hypofunction: Secondary | ICD-10-CM | POA: Diagnosis not present

## 2014-11-15 MED ORDER — TESTOSTERONE CYPIONATE 200 MG/ML IM SOLN
200.0000 mg | Freq: Once | INTRAMUSCULAR | Status: AC
  Administered 2014-11-15: 200 mg via INTRAMUSCULAR

## 2014-11-15 NOTE — Progress Notes (Signed)
   Subjective:    Patient ID: Marcus Gutierrez, male    DOB: 11/20/1960, 54 y.o.   MRN: [id number]  HPI  Patient here for testosterone injection  Review of Systems     Objective:   Physical Exam        Assessment & Plan:   

## 2014-11-30 ENCOUNTER — Ambulatory Visit (INDEPENDENT_AMBULATORY_CARE_PROVIDER_SITE_OTHER): Payer: BC Managed Care – PPO | Admitting: *Deleted

## 2014-11-30 VITALS — BP 94/66 | HR 105

## 2014-11-30 DIAGNOSIS — E291 Testicular hypofunction: Secondary | ICD-10-CM | POA: Diagnosis not present

## 2014-11-30 MED ORDER — TESTOSTERONE CYPIONATE 200 MG/ML IM SOLN
200.0000 mg | Freq: Once | INTRAMUSCULAR | Status: AC
  Administered 2014-11-30: 200 mg via INTRAMUSCULAR

## 2014-11-30 NOTE — Progress Notes (Signed)
   Subjective:    Patient ID: Marcus Gutierrez, male    DOB: 08/07/1960, 54 y.o.   MRN: [id number]  HPI  Patient here for testosterone injection  Review of Systems     Objective:   Physical Exam        Assessment & Plan:   

## 2014-12-13 ENCOUNTER — Other Ambulatory Visit: Payer: Self-pay | Admitting: Emergency Medicine

## 2014-12-13 NOTE — Telephone Encounter (Signed)
Patient has testosterone listed as an allergy. Please clarify and I will send a script if appropriate.

## 2015-01-10 ENCOUNTER — Telehealth: Payer: Self-pay | Admitting: *Deleted

## 2015-01-10 ENCOUNTER — Ambulatory Visit (INDEPENDENT_AMBULATORY_CARE_PROVIDER_SITE_OTHER): Payer: BC Managed Care – PPO | Admitting: Family Medicine

## 2015-01-10 VITALS — BP 104/60 | HR 92 | Temp 99.1°F | Resp 16 | Ht 70.0 in | Wt 141.4 lb

## 2015-01-10 DIAGNOSIS — R05 Cough: Secondary | ICD-10-CM

## 2015-01-10 DIAGNOSIS — J069 Acute upper respiratory infection, unspecified: Secondary | ICD-10-CM | POA: Diagnosis not present

## 2015-01-10 DIAGNOSIS — B349 Viral infection, unspecified: Secondary | ICD-10-CM | POA: Diagnosis not present

## 2015-01-10 DIAGNOSIS — R972 Elevated prostate specific antigen [PSA]: Secondary | ICD-10-CM

## 2015-01-10 DIAGNOSIS — R197 Diarrhea, unspecified: Secondary | ICD-10-CM | POA: Diagnosis not present

## 2015-01-10 DIAGNOSIS — J029 Acute pharyngitis, unspecified: Secondary | ICD-10-CM | POA: Diagnosis not present

## 2015-01-10 DIAGNOSIS — R059 Cough, unspecified: Secondary | ICD-10-CM

## 2015-01-10 LAB — POCT CBC
Granulocyte percent: 49.3 %G (ref 37–80)
HCT, POC: 42.5 % — AB (ref 43.5–53.7)
Hemoglobin: 14 g/dL — AB (ref 14.1–18.1)
Lymph, poc: 2.6 (ref 0.6–3.4)
MCH, POC: 28.2 pg (ref 27–31.2)
MCHC: 33 g/dL (ref 31.8–35.4)
MCV: 85.4 fL (ref 80–97)
MID (cbc): 0.9 (ref 0–0.9)
MPV: 7.8 fL (ref 0–99.8)
POC Granulocyte: 3.4 (ref 2–6.9)
POC LYMPH PERCENT: 38.2 %L (ref 10–50)
POC MID %: 12.5 %M — AB (ref 0–12)
Platelet Count, POC: 335 10*3/uL (ref 142–424)
RBC: 4.97 M/uL (ref 4.69–6.13)
RDW, POC: 13.1 %
WBC: 6.9 10*3/uL (ref 4.6–10.2)

## 2015-01-10 MED ORDER — BENZONATATE 100 MG PO CAPS: 100.0000 mg | ORAL_CAPSULE | Freq: Three times a day (TID) | ORAL | Status: DC | PRN

## 2015-01-10 MED ORDER — HYDROCODONE-HOMATROPINE 5-1.5 MG/5ML PO SYRP
5.0000 mL | ORAL_SOLUTION | ORAL | Status: DC | PRN
Start: 2015-01-10 — End: 2015-07-15

## 2015-01-10 NOTE — Progress Notes (Signed)
Patient ID: Marcus Gutierrez, male    DOB: 12/03/1960  Age: 54 y.o. MRN: [id number]  Chief Complaint  Patient presents with  . Sore Throat  . Cough    x 3 days  . Sinusitis    x 3days  . Diarrhea    x 3 days  . Fever    Subjective:   Patient is been held for about 3 days. He had a sore throat and upper respiratory congestion. He has not been running a documented fever at home nor has a low-grade temp here. He does not have the bad sore social. Has not been vomiting, just the diarrhea. He has a history of colitis for which is on some chronic medication.  Current allergies, medications, problem list, past/family and social histories reviewed.  Objective:  BP 104/60 mmHg  Pulse 92  Temp(Src) 99.1 F (37.3 C) (Oral)  Resp 16  Ht 5\' 10"  (1.778 m)  Wt 141 lb 6.4 oz (64.139 kg)  BMI 20.29 kg/m2  SpO2 97%  No major acute distress. His TMs are normal. Throat clear and not erythematous. No sinus tenderness. Except without nodes. Chest clear to auscultation. Heart regular without any murmurs. Abdomen has bowel sounds present. Soft without masses or tenderness.  Assessment & Plan:   Assessment: 1. Acute upper respiratory infection   2. Diarrhea, unspecified type   3. Cough   4. Sore throat   5. Viral syndrome       Plan: Sore throat Cough Upper respiratory infection Diarrhea Will check CBC  Results for orders placed or performed in visit on 01/10/15  POCT CBC  Result Value Ref Range   WBC 6.9 4.6 - 10.2 K/uL   Lymph, poc 2.6 0.6 - 3.4   POC LYMPH PERCENT 38.2 10 - 50 %L   MID (cbc) 0.9 0 - 0.9   POC MID % 12.5 (A) 0 - 12 %M   POC Granulocyte 3.4 2 - 6.9   Granulocyte percent 49.3 37 - 80 %G   RBC 4.97 4.69 - 6.13 M/uL   Hemoglobin 14.0 (A) 14.1 - 18.1 g/dL   HCT, POC 40.942.5 (A) 81.143.5 - 53.7 %   MCV 85.4 80 - 97 fL   MCH, POC 28.2 27 - 31.2 pg   MCHC 33.0 31.8 - 35.4 g/dL   RDW, POC 91.413.1 %   Platelet Count, POC 335 142 - 424 K/uL   MPV 7.8 0 - 99.8 fL   Consistent  with a viral process. We'll treat symptomatically. He has some Pepto-Bismol at home. Orders Placed This Encounter  Procedures  . POCT CBC     Patient Instructions  Take the Pepto-Bismol for your diarrhea.  Drink plenty of fluids to stay well hydrated. Gatorade is a good choice.  Take the Hycodan cough syrup 1 teaspoon every 4-6 hours if needed for bad nighttime cough  Take the benzonatate 1 or 2 tablets 3 times daily as needed for daytime cough  Try and get plenty of rest today and tomorrow  Return if not improving or if worse at any time. I expect the diarrhea to be doing better by tomorrow, but the cough often will hang on for a week or more.     Return if symptoms worsen or fail to improve.   Kammi Hechler, MD 01/10/2015

## 2015-01-10 NOTE — Telephone Encounter (Signed)
Need refill on testosterone.

## 2015-01-10 NOTE — Patient Instructions (Signed)
Take the Pepto-Bismol for your diarrhea.  Drink plenty of fluids to stay well hydrated. Gatorade is a good choice.  Take the Hycodan cough syrup 1 teaspoon every 4-6 hours if needed for bad nighttime cough  Take the benzonatate 1 or 2 tablets 3 times daily as needed for daytime cough  Try and get plenty of rest today and tomorrow  Return if not improving or if worse at any time. I expect the diarrhea to be doing better by tomorrow, but the cough often will hang on for a week or more.

## 2015-01-11 ENCOUNTER — Other Ambulatory Visit: Payer: Self-pay | Admitting: Emergency Medicine

## 2015-01-11 DIAGNOSIS — E291 Testicular hypofunction: Secondary | ICD-10-CM

## 2015-01-11 MED ORDER — TESTOSTERONE CYPIONATE 200 MG/ML IM SOLN: 200.0000 mg | INTRAMUSCULAR | Status: DC

## 2015-01-11 NOTE — Telephone Encounter (Signed)
Prescription signed and at the desk

## 2015-01-11 NOTE — Telephone Encounter (Signed)
Pt notified that rx was sent in and that he has to come in for a PSA.  He will get it done when he come in for his physical next month.Marcus Gutierrez

## 2015-02-10 ENCOUNTER — Telehealth: Payer: Self-pay

## 2015-02-10 NOTE — Telephone Encounter (Signed)
PA started on covermymeds for testosterone injs. Waiting for ins to load clinical questions so that I can complete it.

## 2015-02-13 ENCOUNTER — Ambulatory Visit (INDEPENDENT_AMBULATORY_CARE_PROVIDER_SITE_OTHER): Payer: BC Managed Care – PPO | Admitting: Emergency Medicine

## 2015-02-13 VITALS — BP 122/74 | HR 89 | Temp 98.4°F | Resp 17 | Ht 69.5 in | Wt 138.0 lb

## 2015-02-13 DIAGNOSIS — E291 Testicular hypofunction: Secondary | ICD-10-CM

## 2015-02-13 DIAGNOSIS — F32A Depression, unspecified: Secondary | ICD-10-CM

## 2015-02-13 DIAGNOSIS — R634 Abnormal weight loss: Secondary | ICD-10-CM | POA: Diagnosis not present

## 2015-02-13 DIAGNOSIS — F329 Major depressive disorder, single episode, unspecified: Secondary | ICD-10-CM

## 2015-02-13 DIAGNOSIS — R972 Elevated prostate specific antigen [PSA]: Secondary | ICD-10-CM

## 2015-02-13 DIAGNOSIS — Z13220 Encounter for screening for lipoid disorders: Secondary | ICD-10-CM

## 2015-02-13 LAB — COMPLETE METABOLIC PANEL WITH GFR
ALT: 4 U/L — ABNORMAL LOW (ref 9–46)
AST: 11 U/L (ref 10–35)
Albumin: 3.8 g/dL (ref 3.6–5.1)
Alkaline Phosphatase: 77 U/L (ref 40–115)
BUN: 13 mg/dL (ref 7–25)
CO2: 30 mmol/L (ref 20–31)
Calcium: 9.3 mg/dL (ref 8.6–10.3)
Chloride: 102 mmol/L (ref 98–110)
Creat: 0.92 mg/dL (ref 0.70–1.33)
GFR, Est African American: >89 mL/min (ref >=60)
GFR, Est Non African American: >89 mL/min (ref >=60)
Glucose, Bld: 91 mg/dL (ref 65–99)
Potassium: 4.8 mmol/L (ref 3.5–5.3)
Sodium: 140 mmol/L (ref 135–146)
Total Bilirubin: 0.3 mg/dL (ref 0.2–1.2)
Total Protein: 7 g/dL (ref 6.1–8.1)

## 2015-02-13 LAB — POCT CBC
Granulocyte percent: 54 %G (ref 37–80)
HCT, POC: 39 % — AB (ref 43.5–53.7)
Hemoglobin: 13.4 g/dL — AB (ref 14.1–18.1)
Lymph, poc: 3.5 — AB (ref 0.6–3.4)
MCH, POC: 29.2 pg (ref 27–31.2)
MCHC: 34.4 g/dL (ref 31.8–35.4)
MCV: 84.8 fL (ref 80–97)
MID (cbc): 0.6 (ref 0–0.9)
MPV: 8.2 fL (ref 0–99.8)
POC Granulocyte: 4.8 (ref 2–6.9)
POC LYMPH PERCENT: 39.4 %L (ref 10–50)
POC MID %: 6.6 %M (ref 0–12)
Platelet Count, POC: 229 10*3/uL (ref 142–424)
RBC: 4.6 M/uL — AB (ref 4.69–6.13)
RDW, POC: 14.3 %
WBC: 8.8 10*3/uL (ref 4.6–10.2)

## 2015-02-13 LAB — LIPID PANEL
Cholesterol: 135 mg/dL (ref 125–200)
HDL: 71 mg/dL (ref >=40)
LDL Cholesterol: 54 mg/dL (ref <130)
Total CHOL/HDL Ratio: 1.9 Ratio (ref <=5.0)
Triglycerides: 52 mg/dL (ref <150)
VLDL: 10 mg/dL (ref <30)

## 2015-02-13 LAB — POCT URINALYSIS DIP (MANUAL ENTRY)
Bilirubin, UA: NEGATIVE
Blood, UA: NEGATIVE
Glucose, UA: NEGATIVE
Ketones, POC UA: NEGATIVE
Nitrite, UA: NEGATIVE
Protein Ur, POC: NEGATIVE
Spec Grav, UA: 1.02
Urobilinogen, UA: 0.2
pH, UA: 6.5

## 2015-02-13 LAB — TSH: TSH: 0.738 u[IU]/mL (ref 0.350–4.500)

## 2015-02-13 LAB — PSA: PSA: 1.67 ng/mL (ref <=4.00)

## 2015-02-13 NOTE — Progress Notes (Signed)
Patient ID: Marcus Gutierrez, male   DOB: 03/04/1960, 55 y.o.   MRN: [id number]     By signing my name below, I, Littie Deedsichard Sun, attest that this documentation has been prepared under the direction and in the presence of Lesle ChrisSteven Greyson Riccardi, MD.  Electronically Signed: Littie Deedsichard Sun, Medical Scribe. 02/13/2015. 8:41 AM.   Chief Complaint:  Chief Complaint  Patient presents with  . Annual Exam  . injection    HPI: Marcus Gutierrez is a 55 y.o. male who [facility]. Patient is undergoing significant family turmoil. He and his wife are separated. His daughter is 617 and currently been sneaking out at night. She recently started counseling. He currently has custody of all 3 children. He was served papers over the weekend over custody issues with his estranged wife. He only eats once a day. He is afraid to take anything for sleep for fear he will get a call about his daughter. 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 He is currently not getting counseling. Patient does not feel his weight loss is due to bowel problems. He agrees to go to EAP.  He will have another colonoscopy in a few months to evaluate his colitis. He believes there were some polyps found on his first colonoscopy. He did have a flu shot this year.   Wt Readings from Last 3 Encounters:  02/13/15 138 lb (62.596 kg)  01/10/15 141 lb 6.4 oz (64.139 kg)  08/15/14 159 lb (72.122 kg)     Past Medical History  Diagnosis Date  . Allergy   . GERD (gastroesophageal reflux disease)   . Seizures (HCC)   . Ulcer   . Ulcerative colitis [facility](HCC)    Past Surgical History  Procedure Laterality Date  . Hernia repair     Social History   Social History  . Marital Status: Single    Spouse Name: N/A  . Number of Children: N/A  . Years of Education: N/A   Social History Main Topics  . Smoking status: Former Smoker    Quit date: 06/01/2003  . Smokeless tobacco: Never Used  . Alcohol Use: No  . Drug Use: No  . Sexual Activity:  Not Asked   Other Topics Concern  . None   Social History Narrative   Family History  Problem Relation Age of Onset  . Cancer Mother   . Heart attack Mother   . Heart attack Father   . Lupus Sister   . Asthma Son    Allergies  Allergen Reactions  . Amoxicillin Other (See Comments)    hematochezia  . Androgel [Testosterone]    Prior to Admission medications   Medication Sig Start Date End Date Taking? Authorizing Provider  alum hydroxide-mag trisilicate (GAVISCON) 80-20 MG CHEW chewable tablet Chew by mouth.   Yes Historical Provider, MD  Budesonide (UCERIS PO) Take by mouth.   Yes Historical Provider, MD  Cholecalciferol (VITAMIN D3) 1000 UNITS CAPS Take 1 capsule by mouth daily.   Yes Historical Provider, MD  esomeprazole (NEXIUM) 40 MG capsule Take 1 capsule (40 mg total) by mouth daily before breakfast. 03/22/11  Yes Collene GobbleSteven A Shastina Rua, MD  mesalamine (LIALDA) 1.2 G EC tablet Take 1,200 mg by mouth daily with breakfast.   Yes Historical Provider, MD  PAZEO 0.7 % SOLN  02/23/14  Yes Historical Provider, MD  sildenafil (VIAGRA) 100 MG tablet Take 0.5-1 tablets (50-100 mg total) by mouth daily as needed for erectile dysfunction. 01/24/14  Yes Collene Gobble, MD  testosterone cypionate (DEPOTESTOSTERONE CYPIONATE) 200 MG/ML injection Inject 1 mL (200 mg total) into the muscle every 14 (fourteen) days. 01/11/15  Yes Collene Gobble, MD  benzonatate (TESSALON) 100 MG capsule Take 1-2 capsules (100-200 mg total) by mouth 3 (three) times daily as needed. Patient not taking: Reported on 02/13/2015 01/10/15   Peyton Najjar, MD  HYDROcodone-homatropine [facility]) 5-1.5 MG/5ML syrup Take 5 mLs by mouth every 4 (four) hours as needed. Patient not taking: Reported on 02/13/2015 01/10/15   Peyton Najjar, MD  terbinafine (LAMISIL) 250 MG tablet Take 1 tablet (250 mg total) by mouth daily. Patient not taking: Reported on 01/10/2015 08/15/14   Collene Gobble, MD     ROS: The patient denies fevers, chills,  night sweats, chest pain, palpitations, wheezing, dyspnea on exertion, nausea, vomiting, abdominal pain, dysuria, hematuria, melena, numbness, weakness, or tingling.   All other systems have been reviewed and were otherwise negative with the exception of those mentioned in the HPI and as above.    PHYSICAL EXAM: Filed Vitals:   02/13/15 0819  BP: 122/74  Pulse: 89  Temp: 98.4 F (36.9 C)  Resp: 17   Body mass index is 20.09 kg/(m^2).   General: Patient has obvious significant weight loss. He is alert, cooperative.  HEENT:  Normocephalic, atraumatic, oropharynx patent. Eye: [facility] Cardiovascular:  Regular rate and rhythm, no rubs murmurs or gallops.  No Carotid bruits, radial pulse intact. No pedal edema.  Respiratory: Clear to auscultation bilaterally.  No wheezes, rales, or rhonchi.  No cyanosis, no use of accessory musculature Abdominal: No organomegaly, abdomen is soft and non-tender, positive bowel sounds.  No masses. Musculoskeletal: Gait intact. No edema, tenderness Skin: No rashes. Neurologic: Facial musculature symmetric. Psychiatric: Patient acts appropriately throughout our interaction. Lymphatic: No cervical or submandibular lymphadenopathy     LABS: Results for orders placed or performed in visit on 02/13/15  POCT CBC  Result Value Ref Range   WBC 8.8 4.6 - 10.2 K/uL   Lymph, poc 3.5 (A) 0.6 - 3.4   POC LYMPH PERCENT 39.4 10 - 50 %L   MID (cbc) 0.6 0 - 0.9   POC MID % 6.6 0 - 12 %M   POC Granulocyte 4.8 2 - 6.9   Granulocyte percent 54.0 37 - 80 %G   RBC 4.60 (A) 4.69 - 6.13 M/uL   Hemoglobin 13.4 (A) 14.1 - 18.1 g/dL   HCT, POC 96.0 (A) 45.4 - 53.7 %   MCV 84.8 80 - 97 fL   MCH, POC 29.2 27 - 31.2 pg   MCHC 34.4 31.8 - 35.4 g/dL   RDW, POC 09.8 %   Platelet Count, POC 229 142 - 424 K/uL   MPV 8.2 0 - 99.8 fL  POCT urinalysis dipstick  Result Value Ref Range   Color, UA yellow yellow   Clarity, UA clear clear   Glucose, UA negative negative    Bilirubin, UA negative negative   Ketones, POC UA negative negative   Spec Grav, UA 1.020    Blood, UA negative negative   pH, UA 6.5    Protein Ur, POC negative negative   Urobilinogen, UA 0.2    Nitrite, UA Negative Negative   Leukocytes, UA small (1+) (A) Negative     EKG/XRAY:   Primary read interpreted by Dr. Cleta Alberts at [facility].   ASSESSMENT/PLAN: Patient struggling with his situation at home. He is going to contact the EAP person as  well as try boost 3 times a day. He is not suicidal. His PSA had risen from 1-2 recently. He was to have a PSA 6 months ago but has not had this done. His last testosterone level was also elevated. I did not give him his testosterone shot will await his PSA and testosterone results. He is up-to-date and received his flu and tetanus vaccines.I personally performed the services described in this documentation, which was scribed in my presence. The recorded information has been reviewed and is accurate.    Gross sideeffects, risk and benefits, and alternatives of medications d/w patient. Patient is aware that all medications have potential sideeffects and we are unable to predict every sideeffect or drug-drug interaction that may occur.  Lesle Chris MD 02/13/2015 8:41 AM

## 2015-02-13 NOTE — Patient Instructions (Signed)
Please contact the EAP  program. Try boost 2-3 bottles a day. Recheck in the office in 3 weeks.. I will call you with results of your labs.

## 2015-02-13 NOTE — Telephone Encounter (Signed)
PA approved through 02/13/16, case # [id number]. Notified pharm.

## 2015-02-13 NOTE — Addendum Note (Signed)
Addended by: Lesle ChrisAUB, Latavion Halls A on: 02/13/2015 10:49 AM   Modules accepted: Orders, SmartSet

## 2015-02-17 ENCOUNTER — Encounter: Payer: Self-pay | Admitting: Emergency Medicine

## 2015-03-05 ENCOUNTER — Ambulatory Visit (INDEPENDENT_AMBULATORY_CARE_PROVIDER_SITE_OTHER): Payer: BC Managed Care – PPO | Admitting: Emergency Medicine

## 2015-03-05 VITALS — BP 118/82 | HR 87 | Temp 98.2°F | Resp 18 | Ht 69.0 in | Wt 144.0 lb

## 2015-03-05 DIAGNOSIS — F329 Major depressive disorder, single episode, unspecified: Secondary | ICD-10-CM | POA: Diagnosis not present

## 2015-03-05 DIAGNOSIS — R634 Abnormal weight loss: Secondary | ICD-10-CM

## 2015-03-05 DIAGNOSIS — R972 Elevated prostate specific antigen [PSA]: Secondary | ICD-10-CM

## 2015-03-05 DIAGNOSIS — E291 Testicular hypofunction: Secondary | ICD-10-CM

## 2015-03-05 DIAGNOSIS — F32A Depression, unspecified: Secondary | ICD-10-CM | POA: Insufficient documentation

## 2015-03-05 NOTE — Progress Notes (Signed)
By signing my name below, I, Javier Docker, attest that this documentation has been prepared under the direction and in the presence of [name], MD. Electronically Signed: Javier Docker, ER Scribe. 03/05/2015. 8:16 AM.  Chief Complaint:  Chief Complaint  Patient presents with  . Follow-up    HPI: Marcus Gutierrez is a 55 y.o. male who reports to [facility] today reporting for a follow up visit. Pt here to follow up with recent bout of depression and weight loss. Currently separated with his wife. There is a ct battle with child. Things have been better recently. He is going to EAP program and has a non adversarial relationship with his wife. They are talking and trying to work out the specifics of child custody and child support. He is taking two boost per day.  His PSA has risen on testosterone therapy but after discontinuing testosterone therapy, on repeat testing three weeks ago PSA was back in a normal range.   I saw three weeks ago and he was suffering from severe depression, and was losing weight. At last visit he committed to trying boost three times per day.    Past Medical History  Diagnosis Date  . Allergy   . GERD (gastroesophageal reflux disease)   . Seizures (HCC)   . Ulcer   . Ulcerative colitis [facility])    Past Surgical History  Procedure Laterality Date  . Hernia repair     Social History   Social History  . Marital Status: Married    Spouse Name: N/A  . Number of Children: N/A  . Years of Education: N/A   Social History Main Topics  . Smoking status: Former Smoker    Quit date: 06/01/2003  . Smokeless tobacco: Never Used  . Alcohol Use: No  . Drug Use: No  . Sexual Activity: Not Asked   Other Topics Concern  . None   Social History Narrative   Family History  Problem Relation Age of Onset  . Cancer Mother   . Heart attack Mother   . Heart attack Father   . Lupus Sister   . Asthma Son    Allergies  Allergen Reactions  . Amoxicillin Other  (See Comments)    hematochezia  . Androgel [Testosterone]    Prior to Admission medications   Medication Sig Start Date End Date Taking? Authorizing Provider  alum hydroxide-mag trisilicate (GAVISCON) 80-20 MG CHEW chewable tablet Chew by mouth.   Yes Historical Provider, MD  Budesonide (UCERIS PO) Take by mouth.   Yes Historical Provider, MD  Cholecalciferol (VITAMIN D3) 1000 UNITS CAPS Take 1 capsule by mouth daily.   Yes Historical Provider, MD  esomeprazole (NEXIUM) 40 MG capsule Take 1 capsule (40 mg total) by mouth daily before breakfast. 03/22/11  Yes Collene Gobble, MD  mesalamine (LIALDA) 1.2 G EC tablet Take 1,200 mg by mouth daily with breakfast.   Yes Historical Provider, MD  PAZEO 0.7 % SOLN  02/23/14  Yes Historical Provider, MD  sildenafil (VIAGRA) 100 MG tablet Take 0.5-1 tablets (50-100 mg total) by mouth daily as needed for erectile dysfunction. 01/24/14  Yes Collene Gobble, MD  testosterone cypionate (DEPOTESTOSTERONE CYPIONATE) 200 MG/ML injection Inject 1 mL (200 mg total) into the muscle every 14 (fourteen) days. 01/11/15  Yes Collene Gobble, MD  benzonatate (TESSALON) 100 MG capsule Take 1-2 capsules (100-200 mg total) by mouth 3 (three) times daily as needed. Patient not taking: Reported on 02/13/2015 01/10/15  Peyton Najjar, MD  HYDROcodone-homatropine [facility]) 5-1.5 MG/5ML syrup Take 5 mLs by mouth every 4 (four) hours as needed. Patient not taking: Reported on 02/13/2015 01/10/15   Peyton Najjar, MD  terbinafine (LAMISIL) 250 MG tablet Take 1 tablet (250 mg total) by mouth daily. Patient not taking: Reported on 01/10/2015 08/15/14   Collene Gobble, MD     ROS: The patient denies fevers, chills, night sweats, unintentional weight loss, chest pain, palpitations, wheezing, dyspnea on exertion, nausea, vomiting, abdominal pain, dysuria, hematuria, melena, numbness, weakness, or tingling.   All other systems have been reviewed and were otherwise negative with the exception of  those mentioned in the HPI and as above.    PHYSICAL EXAM: Filed Vitals:   03/05/15 0811  BP: 118/82  Pulse: 87  Temp: 98.2 F (36.8 C)  Resp: 18   Body mass index is 21.26 kg/(m^2).   General: Alert, no acute distress. Pt is more conversant interactive today. He seems in much better spirits. HEENT:  Normocephalic, atraumatic, oropharynx patent. Eye: Nonie Hoyer [facility] Cardiovascular:  Regular rate and rhythm, no rubs murmurs or gallops.  No Carotid bruits, radial pulse intact. No pedal edema.  Respiratory: Clear to auscultation bilaterally.  No wheezes, rales, or rhonchi.  No cyanosis, no use of accessory musculature Abdominal: No organomegaly, abdomen is soft and non-tender, positive bowel sounds.  No masses. Musculoskeletal: Gait intact. No edema, tenderness Skin: No rashes. Neurologic: Facial musculature symmetric. Psychiatric: Patient acts appropriately throughout our interaction. Lymphatic: No cervical or submandibular lymphadenopathy    LABS:   EKG/XRAY:   Primary read interpreted by Dr. Cleta Alberts at [facility].   ASSESSMENT/PLAN: Patient looks significantly better than his last visit. He has been to the EAP program. He is tolerating to boost a day. He has follow-up visits in 6 weeks with the GI specialist regarding his colitis. He is cordial and respective with his wife. Their issues pending in court regarding custody and child support. He will continue with the EAP program. If he calls he can restart his testosterone. He does know he will need a repeat PSA in 6 months.I personally performed the services described in this documentation, which was scribed in my presence. The recorded information has been reviewed and is accurate.  Gross sideeffects, risk and benefits, and alternatives of medications d/w patient. Patient is aware that all medications have potential sideeffects and we are unable to predict every sideeffect or drug-drug interaction that may occur.  Lesle Chris  MD 03/05/2015 8:14 AM

## 2015-03-07 ENCOUNTER — Ambulatory Visit (INDEPENDENT_AMBULATORY_CARE_PROVIDER_SITE_OTHER): Payer: BC Managed Care – PPO | Admitting: *Deleted

## 2015-03-07 DIAGNOSIS — E291 Testicular hypofunction: Secondary | ICD-10-CM

## 2015-03-07 MED ORDER — TESTOSTERONE CYPIONATE 200 MG/ML IM SOLN
200.0000 mg | Freq: Once | INTRAMUSCULAR | Status: AC
  Administered 2015-03-07: 200 mg via INTRAMUSCULAR

## 2015-03-07 NOTE — Progress Notes (Signed)
   Subjective:    Patient ID: Marcus Gutierrez, male    DOB: [birth year], 55 y.o.   MRN: [id number]  HPI  Patient here for testosterone injection.  Review of Systems     Objective:   Physical Exam        Assessment & Plan:

## 2015-06-30 ENCOUNTER — Ambulatory Visit (INDEPENDENT_AMBULATORY_CARE_PROVIDER_SITE_OTHER): Payer: BC Managed Care – PPO | Admitting: *Deleted

## 2015-06-30 DIAGNOSIS — E291 Testicular hypofunction: Secondary | ICD-10-CM

## 2015-06-30 MED ORDER — TESTOSTERONE CYPIONATE 200 MG/ML IM SOLN
200.0000 mg | Freq: Once | INTRAMUSCULAR | Status: AC
  Administered 2015-06-30: 200 mg via INTRAMUSCULAR

## 2015-07-15 ENCOUNTER — Other Ambulatory Visit: Payer: Self-pay | Admitting: Emergency Medicine

## 2015-07-15 ENCOUNTER — Ambulatory Visit (INDEPENDENT_AMBULATORY_CARE_PROVIDER_SITE_OTHER): Payer: BC Managed Care – PPO | Admitting: Emergency Medicine

## 2015-07-15 VITALS — BP 90/64 | HR 94 | Temp 98.5°F | Resp 16 | Ht 69.0 in | Wt 136.8 lb

## 2015-07-15 DIAGNOSIS — F329 Major depressive disorder, single episode, unspecified: Secondary | ICD-10-CM | POA: Diagnosis not present

## 2015-07-15 DIAGNOSIS — E291 Testicular hypofunction: Secondary | ICD-10-CM | POA: Diagnosis not present

## 2015-07-15 DIAGNOSIS — Z1159 Encounter for screening for other viral diseases: Secondary | ICD-10-CM | POA: Diagnosis not present

## 2015-07-15 DIAGNOSIS — Z114 Encounter for screening for human immunodeficiency virus [HIV]: Secondary | ICD-10-CM | POA: Diagnosis not present

## 2015-07-15 DIAGNOSIS — K5100 Ulcerative (chronic) pancolitis without complications: Secondary | ICD-10-CM

## 2015-07-15 DIAGNOSIS — F32A Depression, unspecified: Secondary | ICD-10-CM

## 2015-07-15 DIAGNOSIS — R634 Abnormal weight loss: Secondary | ICD-10-CM

## 2015-07-15 LAB — HIV ANTIBODY (ROUTINE TESTING W REFLEX): HIV 1&2 Ab, 4th Generation: NONREACTIVE

## 2015-07-15 LAB — HEPATITIS C ANTIBODY: HCV Ab: NEGATIVE

## 2015-07-15 LAB — SEDIMENTATION RATE: Sed Rate: 20 mm/hr (ref 0–20)

## 2015-07-15 MED ORDER — TESTOSTERONE CYPIONATE 200 MG/ML IM SOLN: 200.0000 mg | INTRAMUSCULAR | Status: DC

## 2015-07-15 MED ORDER — TESTOSTERONE CYPIONATE 200 MG/ML IM SOLN
200.0000 mg | Freq: Once | INTRAMUSCULAR | Status: AC
  Administered 2015-07-15: 200 mg via INTRAMUSCULAR

## 2015-07-15 MED ORDER — SILDENAFIL CITRATE 100 MG PO TABS: 50.0000 mg | ORAL_TABLET | Freq: Every day | ORAL | Status: DC | PRN

## 2015-07-15 NOTE — Progress Notes (Signed)
By signing my name below, I, Javier Docker, attest that this documentation has been prepared under the direction and in the presence of [name], MD. Electronically Signed: Javier Docker, ER Scribe. 07/15/2015. 12:48 PM.  Chief Complaint:  Chief Complaint  Patient presents with  . Weight Loss  . follow up depression  . testosterone injection  . med refills vigra and testosterone   HPI: Marcus Gutierrez is a 55 y.o. male who reports to [facility] today complaining of continued weight loss and depression. He also needs medication refill. He picks up his son on his lunch hour, so he doesn't eat lunch during that time. He is only eating one meal at night. He is eating two smoothies during the day. His children are living with him. He doesn't have much of an appetite. His wife and he are now sharing custody of children. He still has 80% of the responsibility for his children.    Past Medical History  Diagnosis Date  . Allergy   . GERD (gastroesophageal reflux disease)   . Seizures (HCC)   . Ulcer   . Ulcerative colitis [facility])    Past Surgical History  Procedure Laterality Date  . Hernia repair     Social History   Social History  . Marital Status: Married    Spouse Name: N/A  . Number of Children: N/A  . Years of Education: N/A   Social History Main Topics  . Smoking status: Former Smoker    Quit date: 06/01/2003  . Smokeless tobacco: Never Used  . Alcohol Use: No  . Drug Use: No  . Sexual Activity: Not Asked   Other Topics Concern  . None   Social History Narrative   Family History  Problem Relation Age of Onset  . Cancer Mother   . Heart attack Mother   . Heart attack Father   . Lupus Sister   . Asthma Son    Allergies  Allergen Reactions  . Amoxicillin Other (See Comments)    hematochezia  . Androgel [Testosterone]    Prior to Admission medications   Medication Sig Start Date End Date Taking? Authorizing Provider  alum hydroxide-mag trisilicate  (GAVISCON) 80-20 MG CHEW chewable tablet Chew by mouth.   Yes Historical Provider, MD  Budesonide (UCERIS PO) Take by mouth.   Yes Historical Provider, MD  Cholecalciferol (VITAMIN D3) 1000 UNITS CAPS Take 1 capsule by mouth daily.   Yes Historical Provider, MD  esomeprazole (NEXIUM) 40 MG capsule Take 1 capsule (40 mg total) by mouth daily before breakfast. 03/22/11  Yes Collene Gobble, MD  mesalamine (LIALDA) 1.2 G EC tablet Take 1,200 mg by mouth daily with breakfast.   Yes Historical Provider, MD  PAZEO 0.7 % SOLN  02/23/14  Yes Historical Provider, MD  sildenafil (VIAGRA) 100 MG tablet Take 0.5-1 tablets (50-100 mg total) by mouth daily as needed for erectile dysfunction. 01/24/14  Yes Collene Gobble, MD  testosterone cypionate (DEPOTESTOSTERONE CYPIONATE) 200 MG/ML injection Inject 1 mL (200 mg total) into the muscle every 14 (fourteen) days. 01/11/15  Yes Collene Gobble, MD     ROS: The patient denies fevers, chills, night sweats, unintentional weight loss, chest pain, palpitations, wheezing, dyspnea on exertion, nausea, vomiting, abdominal pain, dysuria, hematuria, melena, numbness, weakness, or tingling.   All other systems have been reviewed and were otherwise negative with the exception of those mentioned in the HPI and as above.    PHYSICAL EXAM: Filed Vitals:  07/15/15 1130  BP: 90/64  Pulse: 94  Temp: 98.5 F (36.9 C)  Resp: 16   Body mass index is 20.19 kg/(m^2).   General: Alert, no acute distress. He shows continued signs of weight loss. HEENT:  Normocephalic, atraumatic, oropharynx patent. Eye: Non[facility]OMI[facility] Cardiovascular:  Regular rate and rhythm, no rubs murmurs or gallops.  No Carotid bruits, radial pulse intact. No pedal edema.  Respiratory: Clear to auscultation bilaterally.  No wheezes, rales, or rhonchi.  No cyanosis, no use of accessory musculature Abdominal: No organomegaly, abdomen is soft and non-tender, positive bowel sounds.  No masses. Musculoskeletal:  Gait intact. No edema, tenderness Skin: No rashes. Neurologic: Facial musculature symmetric. Psychiatric: Patient acts appropriately throughout our interaction. Lymphatic: No cervical or submandibular lymphadenopathy    LABS:    EKG/XRAY:   [facility]   ASSESSMENT/PLAN: I refilled testosterone and Viagra. Routine labs were done today. I did a sedimentation rate because of the weight loss. [facility] to see the patient in 3-4 months.I personally performed the services described in this documentation, which was scribed in my presence. The recorded information has been reviewed and is accurate.   Gross sideeffects, risk and benefits, and alternatives of medications d/w patient. Patient is aware that all medications have potential sideeffects and we are unable to predict every sideeffect or drug-drug interaction that may occur.  Lesle ChrisSteven Yuki Purves MD 07/15/2015 12:48 PM

## 2015-07-17 LAB — PSA: PSA: 1.58 ng/mL (ref <=4.00)

## 2015-07-18 ENCOUNTER — Telehealth: Payer: Self-pay | Admitting: Emergency Medicine

## 2015-07-18 NOTE — Telephone Encounter (Signed)
-----   Message from Collene GobbleSteven A Daub, MD sent at 07/16/2015  8:00 AM EDT ----- Please add a comprehensive metabolic panel to bloodwork at the lab.

## 2015-07-19 ENCOUNTER — Telehealth: Payer: Self-pay | Admitting: Emergency Medicine

## 2015-07-19 LAB — COMPREHENSIVE METABOLIC PANEL
ALT: 4 U/L — ABNORMAL LOW (ref 9–46)
AST: 9 U/L — ABNORMAL LOW (ref 10–35)
Albumin: 3.9 g/dL (ref 3.6–5.1)
Alkaline Phosphatase: 77 U/L (ref 40–115)
BUN: 10 mg/dL (ref 7–25)
CO2: 19 mmol/L — ABNORMAL LOW (ref 20–31)
Calcium: 9 mg/dL (ref 8.6–10.3)
Chloride: 101 mmol/L (ref 98–110)
Creat: 0.99 mg/dL (ref 0.70–1.33)
Glucose, Bld: 80 mg/dL (ref 65–99)
Potassium: 4.6 mmol/L (ref 3.5–5.3)
Sodium: 139 mmol/L (ref 135–146)
Total Bilirubin: 0.4 mg/dL (ref 0.2–1.2)
Total Protein: 7.2 g/dL (ref 6.1–8.1)

## 2015-07-19 LAB — TESTOS,TOTAL,FREE AND SHBG (FEMALE)
Sex Hormone Binding Glob.: 36 nmol/L (ref 10–50)
Testosterone, Free: 77.6 pg/mL (ref 35.0–155.0)
Testosterone,Total,LC/MS/MS: 491 ng/dL (ref 250–1100)

## 2015-07-19 NOTE — Telephone Encounter (Signed)
Attempted to reach pt with results No answer, no VM set up

## 2015-07-19 NOTE — Telephone Encounter (Signed)
-----   Message from Darlyne Russian, MD sent at 07/19/2015  4:11 PM EDT ----- Call testosterone level normal

## 2015-07-21 ENCOUNTER — Telehealth: Payer: Self-pay | Admitting: Emergency Medicine

## 2015-07-21 DIAGNOSIS — E878 Other disorders of electrolyte and fluid balance, not elsewhere classified: Secondary | ICD-10-CM

## 2015-07-21 NOTE — Telephone Encounter (Signed)
Pt given test results and explained low levels. Pt will come in next week for injection, blood work and f/u with Dr. Everlene Farrier Placed future BMP order

## 2015-07-21 NOTE — Telephone Encounter (Signed)
-----   Message from Collene GobbleSteven A Daub, MD sent at 07/20/2015  7:27 AM EDT ----- His bicarbonate level was slightly low. This is most likely secondary to him skipping meals and not eating as he should. We should repeat this in about a week. He can come in for lab only order for a basic metabolic panel.

## 2015-07-28 ENCOUNTER — Ambulatory Visit (INDEPENDENT_AMBULATORY_CARE_PROVIDER_SITE_OTHER): Payer: BC Managed Care – PPO

## 2015-07-28 ENCOUNTER — Ambulatory Visit
Admission: RE | Admit: 2015-07-28 | Discharge: 2015-07-28 | Disposition: A | Discharge status: Home / Self Care | Payer: BC Managed Care – PPO | Source: Ambulatory Visit | Attending: Emergency Medicine | Admitting: Emergency Medicine

## 2015-07-28 ENCOUNTER — Ambulatory Visit (INDEPENDENT_AMBULATORY_CARE_PROVIDER_SITE_OTHER): Payer: BC Managed Care – PPO | Admitting: Emergency Medicine

## 2015-07-28 VITALS — BP 124/78 | HR 94 | Temp 98.3°F | Resp 17 | Ht 69.0 in | Wt 139.0 lb

## 2015-07-28 DIAGNOSIS — G44039 Episodic paroxysmal hemicrania, not intractable: Secondary | ICD-10-CM

## 2015-07-28 DIAGNOSIS — R519 Headache, unspecified: Secondary | ICD-10-CM

## 2015-07-28 DIAGNOSIS — E878 Other disorders of electrolyte and fluid balance, not elsewhere classified: Secondary | ICD-10-CM

## 2015-07-28 DIAGNOSIS — R634 Abnormal weight loss: Secondary | ICD-10-CM | POA: Diagnosis not present

## 2015-07-28 DIAGNOSIS — E872 Acidosis, unspecified: Secondary | ICD-10-CM

## 2015-07-28 DIAGNOSIS — R51 Headache: Secondary | ICD-10-CM

## 2015-07-28 DIAGNOSIS — E291 Testicular hypofunction: Secondary | ICD-10-CM

## 2015-07-28 LAB — BASIC METABOLIC PANEL
BUN: 10 mg/dL (ref 7–25)
CO2: 27 mmol/L (ref 20–31)
Calcium: 9.3 mg/dL (ref 8.6–10.3)
Chloride: 100 mmol/L (ref 98–110)
Creat: 0.79 mg/dL (ref 0.70–1.33)
Glucose, Bld: 93 mg/dL (ref 65–99)
Potassium: 4.6 mmol/L (ref 3.5–5.3)
Sodium: 137 mmol/L (ref 135–146)

## 2015-07-28 MED ORDER — BUTALBITAL-APAP-CAFFEINE 50-325-40 MG PO TABS: 1.0000 | ORAL_TABLET | Freq: Four times a day (QID) | ORAL | Status: DC | PRN

## 2015-07-28 MED ORDER — TESTOSTERONE CYPIONATE 200 MG/ML IM SOLN
200.0000 mg | Freq: Once | INTRAMUSCULAR | Status: AC
  Administered 2015-07-28: 200 mg via INTRAMUSCULAR

## 2015-07-28 NOTE — Patient Instructions (Addendum)
     IF you received an x-ray today, you will receive an invoice from [facility]. Please contact [facility] at [phone] with questions or concerns regarding your invoice.   IF you received labwork today, you will receive an invoice from [facility]. Please contact [facility] at [phone] with questions or concerns regarding your invoice.   Our billing staff will not be able to assist you with questions regarding bills from these companies.  You will be contacted with the lab results as soon as they are available. The fastest way to get your results is to activate your My Chart account. Instructions are located on the last page of this paperwork. If you have not heard from us regarding the results in 2 weeks, please contact this office.    [facility] located at [employer]  It will be a walk in CT scan. Please register and advise them [facility] are [facility].

## 2015-07-28 NOTE — Progress Notes (Addendum)
By signing my name below, I, Mesha Guinyard, attest that this documentation has been prepared under the direction and in the presence of Lesle Chris, MD.  Electronically Signed: Arvilla Market, Medical Scribe. 07/28/2015. 8:53 AM.   Chief Complaint:  Chief Complaint  Patient presents with  . Follow-up    Lab result  . Sinusitis  . Injections    testosterone    HPI: Marcus Gutierrez is a 55 y.o. male who reports to [facility] today complaining of worsening frontal lobe HA onset a week. Pt thinks he has a sinus infection. Pt reports loose stool. Pt denies taking herbal supplements. Pt states he's been gaining weight. Pt denies emesis, and rhinorrhea.  Pt saw his ophthalmologist, Dr. Patsy Lager, earlier this year to remove a chalazion and they found out he has cataracts during the procedure.  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 Pt is concerned about the paper work from [employer] getting here at the office.  He's still proceeding with custody issues with the court his daughter turns 42 y/o this year and will probably be moving out  Wt Readings from Last 3 Encounters:  07/28/15 139 lb (63.05 kg)  07/15/15 136 lb 12.8 oz (62.052 kg)  03/05/15 144 lb (65.318 kg)     Past Medical History  Diagnosis Date  . Allergy   . GERD (gastroesophageal reflux disease)   . Seizures (HCC)   . Ulcer   . Ulcerative colitis [facility])    Past Surgical History  Procedure Laterality Date  . Hernia repair     Social History   Social History  . Marital Status: Married    Spouse Name: N/A  . Number of Children: N/A  . Years of Education: N/A   Social History Main Topics  . Smoking status: Former Smoker    Quit date: 06/01/2003  . Smokeless tobacco: Never Used  . Alcohol Use: No  . Drug Use: No  . Sexual Activity: Not Asked   Other Topics Concern  . None   Social History Narrative   Family History  Problem Relation Age of Onset  . Cancer  Mother   . Heart attack Mother   . Heart attack Father   . Lupus Sister   . Asthma Son    Allergies  Allergen Reactions  . Amoxicillin Other (See Comments)    hematochezia  . Androgel [Testosterone]    Prior to Admission medications   Medication Sig Start Date End Date Taking? Authorizing Provider  alum hydroxide-mag trisilicate (GAVISCON) 80-20 MG CHEW chewable tablet Chew by mouth.   Yes Historical Provider, MD  Budesonide (UCERIS PO) Take by mouth.   Yes Historical Provider, MD  Cholecalciferol (VITAMIN D3) 1000 UNITS CAPS Take 1 capsule by mouth daily.   Yes Historical Provider, MD  esomeprazole (NEXIUM) 40 MG capsule Take 1 capsule (40 mg total) by mouth daily before breakfast. 03/22/11  Yes Collene Gobble, MD  mesalamine (LIALDA) 1.2 G EC tablet Take 1,200 mg by mouth daily with breakfast.   Yes Historical Provider, MD  PAZEO 0.7 % SOLN  02/23/14  Yes Historical Provider, MD  sildenafil (VIAGRA) 100 MG tablet Take 0.5-1 tablets (50-100 mg total) by mouth daily as needed for erectile dysfunction. 07/15/15  Yes Collene Gobble, MD  testosterone cypionate (DEPOTESTOSTERONE CYPIONATE) 200 MG/ML injection Inject 1 mL (200 mg total) into the muscle every 14 (fourteen) days. 07/15/15  Yes Collene Gobble, MD  ROS: The patient denies fevers, chills, night sweats, unintentional weight loss, chest pain, palpitations, wheezing, dyspnea on exertion, nausea, vomiting, abdominal pain, dysuria, hematuria, melena, numbness, weakness, or tingling.  All other systems have been reviewed and were otherwise negative with the exception of those mentioned in the HPI and as above.    PHYSICAL EXAM: Filed Vitals:   07/28/15 0839  BP: 124/78  Pulse: 94  Temp: 98.3 F (36.8 C)  Resp: 17   Body mass index is 20.52 kg/(m^2).   General: Alert, cooperative no acute distress HEENT:  Normocephalic, atraumatic, oropharynx patent. Nose had slight congestion Eye: EOMI, [facility]. Optic nerve: Dismargins are sharp,  no exudate Cardiovascular:  Regular rate and rhythm, no rubs murmurs or gallops.  No Carotid bruits, radial pulse intact. No pedal edema.  Respiratory: Clear to auscultation bilaterally.  No wheezes, rales, or rhonchi.  No cyanosis, no use of accessory musculature Abdominal: No organomegaly, abdomen is soft and non-tender, positive bowel sounds.  No masses. Musculoskeletal: Gait intact. No edema, tenderness Skin: No rashes. Neurologic: Facial musculature symmetric. Psychiatric: Patient acts appropriately throughout our interaction. Lymphatic: No cervical or submandibular lymphadenopathy  LABS:  Dg Sinus 1-2 Views  07/28/2015  CLINICAL DATA:  55 year old male with increasing frontal headache this week. Possible sinus infection. Initial encounter. EXAM: PARANASAL SINUSES - 1-2 VIEW COMPARISON:  [facility] cervical spine CT 12/27/2013 and earlier. FINDINGS: Both frontal sinuses an the visible ethmoids appear clear. No definite maxillary sinus opacification. Mastoid air cell pneumatization also appears symmetric. Bone mineralization within normal limits. IMPRESSION: Negative single view radiographic appearance of the paranasal sinuses. Electronically Signed   By: Odessa FlemingH  Hall M.D.   On: 07/28/2015 10:02      EKG/XRAY:   Primary [facility]    ASSESSMENT/PLAN: We'll treat with Z-Pak. We'll schedule CT of the head this should show his sinuses. I like to go ahead and CT his head because of his weight loss. He has gained weight since his last visit here.I personally performed the services described in this documentation, which was scribed in my presence. The recorded information has been reviewed and is accurate. CT reviewed and is normal. Will fax in a trial of Fioricet.   Gross sideeffects, risk and benefits, and alternatives of medications d/w patient. Patient is aware that all medications have potential sideeffects and we are unable to predict every sideeffect or  drug-drug interaction that may occur.  Lesle ChrisSteven Oddis Westling MD 07/28/2015 8:53 AM

## 2015-08-28 ENCOUNTER — Ambulatory Visit (INDEPENDENT_AMBULATORY_CARE_PROVIDER_SITE_OTHER): Payer: BC Managed Care – PPO | Admitting: Urgent Care

## 2015-08-28 DIAGNOSIS — E291 Testicular hypofunction: Secondary | ICD-10-CM | POA: Diagnosis not present

## 2015-08-28 MED ORDER — TESTOSTERONE CYPIONATE 200 MG/ML IM SOLN
200.0000 mg | Freq: Once | INTRAMUSCULAR | Status: AC
  Administered 2015-08-28: 200 mg via INTRAMUSCULAR

## 2015-08-28 NOTE — Progress Notes (Signed)
Patient presented for testosterone injection. CMA Sheketia requested that I close the chart.

## 2015-10-14 ENCOUNTER — Ambulatory Visit (INDEPENDENT_AMBULATORY_CARE_PROVIDER_SITE_OTHER): Payer: BC Managed Care – PPO | Admitting: Urgent Care

## 2015-10-14 DIAGNOSIS — E291 Testicular hypofunction: Secondary | ICD-10-CM | POA: Diagnosis not present

## 2015-10-14 MED ORDER — TESTOSTERONE CYPIONATE 100 MG/ML IM SOLN
200.0000 mg | Freq: Once | INTRAMUSCULAR | Status: AC
  Administered 2015-10-14: 200 mg via INTRAMUSCULAR

## 2015-10-14 MED ORDER — TESTOSTERONE CYPIONATE 200 MG/ML IM SOLN: 200.0000 mg | Freq: Once | INTRAMUSCULAR | 0 refills | Status: DC

## 2015-10-14 NOTE — Progress Notes (Signed)
Patient was here for Testosterone injection.

## 2015-10-30 ENCOUNTER — Ambulatory Visit (INDEPENDENT_AMBULATORY_CARE_PROVIDER_SITE_OTHER): Payer: BC Managed Care – PPO | Admitting: Urgent Care

## 2015-10-30 DIAGNOSIS — E291 Testicular hypofunction: Secondary | ICD-10-CM | POA: Diagnosis not present

## 2015-10-30 MED ORDER — TESTOSTERONE CYPIONATE 200 MG/ML IM SOLN
200.0000 mg | Freq: Once | INTRAMUSCULAR | Status: AC
  Administered 2015-10-30: 200 mg via INTRAMUSCULAR

## 2015-10-30 NOTE — Progress Notes (Signed)
Patient was in for Testosterone injection.

## 2015-10-30 NOTE — Patient Instructions (Signed)
     IF you received an x-ray today, you will receive an invoice from [facility]. Please contact [facility] at [phone] with questions or concerns regarding your invoice.   IF you received labwork today, you will receive an invoice from [facility]. Please contact [facility] at [phone] with questions or concerns regarding your invoice.   Our billing staff will not be able to assist you with questions regarding bills from these companies.  You will be contacted with the lab results as soon as they are available. The fastest way to get your results is to activate your My Chart account. Instructions are located on the last page of this paperwork. If you have not heard from us regarding the results in 2 weeks, please contact this office.      

## 2016-03-18 ENCOUNTER — Encounter: Payer: Self-pay | Admitting: Physician Assistant

## 2016-03-18 ENCOUNTER — Ambulatory Visit (INDEPENDENT_AMBULATORY_CARE_PROVIDER_SITE_OTHER): Payer: BC Managed Care – PPO | Admitting: Physician Assistant

## 2016-03-18 DIAGNOSIS — E291 Testicular hypofunction: Secondary | ICD-10-CM | POA: Diagnosis not present

## 2016-03-18 MED ORDER — TESTOSTERONE CYPIONATE 200 MG/ML IM SOLN
200.0000 mg | Freq: Once | INTRAMUSCULAR | Status: AC
  Administered 2016-03-18: 200 mg via INTRAMUSCULAR

## 2016-03-18 MED ORDER — TESTOSTERONE CYPIONATE 200 MG/ML IM SOLN: 200.0000 mg | Freq: Once | INTRAMUSCULAR | 0 refills | Status: DC

## 2016-03-18 NOTE — Progress Notes (Signed)
Patient here for Testosterone injections.

## 2016-04-02 ENCOUNTER — Ambulatory Visit (INDEPENDENT_AMBULATORY_CARE_PROVIDER_SITE_OTHER): Payer: BC Managed Care – PPO | Admitting: Physician Assistant

## 2016-04-02 DIAGNOSIS — E291 Testicular hypofunction: Secondary | ICD-10-CM

## 2016-04-02 MED ORDER — TESTOSTERONE CYPIONATE 200 MG/ML IM SOLN
200.0000 mg | Freq: Once | INTRAMUSCULAR | Status: AC
  Administered 2016-04-02: 200 mg via INTRAMUSCULAR

## 2016-04-15 ENCOUNTER — Ambulatory Visit: Payer: BC Managed Care – PPO

## 2016-04-15 ENCOUNTER — Ambulatory Visit (INDEPENDENT_AMBULATORY_CARE_PROVIDER_SITE_OTHER): Payer: BC Managed Care – PPO | Admitting: Physician Assistant

## 2016-04-15 DIAGNOSIS — E291 Testicular hypofunction: Secondary | ICD-10-CM | POA: Diagnosis not present

## 2016-04-15 MED ORDER — TESTOSTERONE CYPIONATE 200 MG/ML IM SOLN
200.0000 mg | Freq: Once | INTRAMUSCULAR | Status: AC
  Administered 2016-04-15: 200 mg via INTRAMUSCULAR

## 2016-04-15 NOTE — Progress Notes (Signed)
Patient is here for Testosterone injection

## 2016-04-15 NOTE — Patient Instructions (Signed)
     IF you received an x-ray today, you will receive an invoice from [facility]. Please contact [facility] at [phone] with questions or concerns regarding your invoice.   IF you received labwork today, you will receive an invoice from [facility]. Please contact [facility] at [phone] with questions or concerns regarding your invoice.   Our billing staff will not be able to assist you with questions regarding bills from these companies.  You will be contacted with the lab results as soon as they are available. The fastest way to get your results is to activate your My Chart account. Instructions are located on the last page of this paperwork. If you have not heard from us regarding the results in 2 weeks, please contact this office.     

## 2016-04-29 ENCOUNTER — Ambulatory Visit: Payer: BC Managed Care – PPO

## 2016-04-29 DIAGNOSIS — E291 Testicular hypofunction: Secondary | ICD-10-CM

## 2016-04-29 MED ORDER — TESTOSTERONE CYPIONATE 200 MG/ML IM SOLN
200.0000 mg | INTRAMUSCULAR | Status: DC
  Administered 2016-04-29 – 2016-09-16 (×10): 200 mg via INTRAMUSCULAR

## 2016-04-29 NOTE — Progress Notes (Signed)
Patient was here for testosterone injection

## 2016-04-29 NOTE — Patient Instructions (Signed)
  Patient was here for testosterone injection.   IF you received an x-ray today, you will receive an invoice from [facility]. Please contact [facility] at [phone] with questions or concerns regarding your invoice.   IF you received labwork today, you will receive an invoice from [facility]. Please contact [facility] at [phone] with questions or concerns regarding your invoice.   Our billing staff will not be able to assist you with questions regarding bills from these companies.  You will be contacted with the lab results as soon as they are available. The fastest way to get your results is to activate your My Chart account. Instructions are located on the last page of this paperwork. If you have not heard from us regarding the results in 2 weeks, please contact this office.

## 2016-05-14 ENCOUNTER — Ambulatory Visit (INDEPENDENT_AMBULATORY_CARE_PROVIDER_SITE_OTHER): Payer: BC Managed Care – PPO | Admitting: Physician Assistant

## 2016-05-14 DIAGNOSIS — E291 Testicular hypofunction: Secondary | ICD-10-CM | POA: Diagnosis not present

## 2016-05-14 NOTE — Progress Notes (Signed)
Patient here for testosterone injection 

## 2016-05-27 ENCOUNTER — Ambulatory Visit (INDEPENDENT_AMBULATORY_CARE_PROVIDER_SITE_OTHER): Payer: BC Managed Care – PPO | Admitting: Physician Assistant

## 2016-05-27 DIAGNOSIS — E291 Testicular hypofunction: Secondary | ICD-10-CM

## 2016-05-27 NOTE — Progress Notes (Signed)
Patient was here to get testosterone injection.

## 2016-06-04 ENCOUNTER — Ambulatory Visit (INDEPENDENT_AMBULATORY_CARE_PROVIDER_SITE_OTHER): Payer: BC Managed Care – PPO | Admitting: Family Medicine

## 2016-06-04 VITALS — BP 96/69 | HR 104 | Temp 98.3°F | Resp 18 | Ht 69.0 in | Wt 137.8 lb

## 2016-06-04 DIAGNOSIS — J301 Allergic rhinitis due to pollen: Secondary | ICD-10-CM

## 2016-06-04 DIAGNOSIS — H6123 Impacted cerumen, bilateral: Secondary | ICD-10-CM | POA: Diagnosis not present

## 2016-06-04 MED ORDER — CETIRIZINE HCL 10 MG PO TABS: 10.0000 mg | ORAL_TABLET | Freq: Every day | ORAL | 11 refills | Status: DC

## 2016-06-04 MED ORDER — FLUTICASONE PROPIONATE 50 MCG/ACT NA SUSP: 2.0000 | Freq: Every day | NASAL | 6 refills | Status: DC

## 2016-06-04 NOTE — Progress Notes (Signed)
Chief Complaint  Patient presents with  . Cerumen Impaction    left ear     HPI   Cerumen Impaction Pt reports that he has been having a dull sensation and pressure in his left ear with some decreased hearing.  He states that he tried some home flushes that he bought at the drug store. Reports that it did not help.  He reports that it is a relatively common concern he has.  Allergic Rhinitis Allergic Rhinitis: FEDRICK CEFALU is here for evaluation of possible allergic rhinitis. Patient's symptoms include clear rhinorrhea, itchy eyes, itchy nose, postnasal drip and watery eyes. These symptoms are seasonal. Current triggers include exposure to pollens. The patient has been suffering from these symptoms for approximately 2 weeks. The patient has not tried any medications. Immunotherapy has never been tried. The patient has never had nasal polyps. The patient has no history of asthma. The patient has not had sinus surgery in the past. The patient has no history of eczema.    Past Medical History:  Diagnosis Date  . Allergy   . GERD (gastroesophageal reflux disease)   . Seizures (HCC)   . Ulcer   . Ulcerative colitis (HCC)     Current Outpatient Prescriptions  Medication Sig Dispense Refill  . alum hydroxide-mag trisilicate (GAVISCON) 80-20 MG CHEW chewable tablet Chew by mouth.    . Budesonide (UCERIS PO) Take by mouth.    . butalbital-acetaminophen-caffeine (FIORICET, ESGIC) 50-325-40 MG tablet Take 1 tablet by mouth every 6 (six) hours as needed for headache. 14 tablet 0  . Cholecalciferol (VITAMIN D3) 1000 UNITS CAPS Take 1 capsule by mouth daily.    Marland Kitchen esomeprazole (NEXIUM) 40 MG capsule Take 1 capsule (40 mg total) by mouth daily before breakfast. 30 capsule 12  . mesalamine (LIALDA) 1.2 G EC tablet Take 1,200 mg by mouth daily with breakfast.    . PAZEO 0.7 % SOLN   0  . sildenafil (VIAGRA) 100 MG tablet Take 0.5-1 tablets (50-100 mg total) by mouth daily as needed for  erectile dysfunction. 5 tablet 11  . testosterone cypionate (DEPOTESTOSTERONE CYPIONATE) 200 MG/ML injection Inject 1 mL (200 mg total) into the muscle every 14 (fourteen) days. 6 mL 1  . cetirizine (ZYRTEC) 10 MG tablet Take 1 tablet (10 mg total) by mouth daily. 30 tablet 11  . fluticasone (FLONASE) 50 MCG/ACT nasal spray Place 2 sprays into both nostrils daily. 16 g 6   Current Facility-Administered Medications  Medication Dose Route Frequency Provider Last Rate Last Dose  . testosterone cypionate (DEPOTESTOSTERONE CYPIONATE) injection 200 mg  200 mg Intramuscular Q14 Days Ofilia Neas, PA-C   200 mg at 05/27/16 1742    Allergies:  Allergies  Allergen Reactions  . Amoxicillin Other (See Comments)    hematochezia  . Androgel [Testosterone]     Past Surgical History:  Procedure Laterality Date  . HERNIA REPAIR      Social History   Social History  . Marital status: Married    Spouse name: N/A  . Number of children: N/A  . Years of education: N/A   Social History Main Topics  . Smoking status: Former Smoker    Quit date: 06/01/2003  . Smokeless tobacco: Never Used  . Alcohol use No  . Drug use: No  . Sexual activity: Not Asked   Other Topics Concern  . None   Social History Narrative  . None    Review of Systems  Constitutional: Negative for chills  and fever.  HENT: Positive for ear pain. Negative for ear discharge, hearing loss and tinnitus.   Respiratory: Negative for cough.     Objective: Vitals:   06/04/16 0859  BP: 96/69  Pulse: (!) 104  Resp: 18  Temp: 98.3 F (36.8 C)  TempSrc: Oral  SpO2: 98%  Weight: 137 lb 12.8 oz (62.5 kg)  Height:  (1.753 m)    Physical Exam  Constitutional: He is oriented to person, place, and time. He appears well-developed and well-nourished.  HENT:  Head: Normocephalic and atraumatic.  Mouth/Throat: Oropharynx is clear and moist.  Bilateral cerumen impaction Nose noted to have mild erythema, no ulceration,  no visible polyps  Eyes: Conjunctivae and EOM are normal.  Cardiovascular: Normal rate, regular rhythm and normal heart sounds.   No murmur heard. Pulmonary/Chest: Effort normal and breath sounds normal. No respiratory distress. He has no wheezes. He has no rales.  Neurological: He is alert and oriented to person, place, and time.      Assessment and Plan Deng was seen today for cerumen impaction.  Diagnoses and all orders for this visit:  Bilateral impacted cerumen-  Ear lavage today -     Ear wax removal  Seasonal allergic rhinitis due to pollen- discussed using a nasal steroid and an antihistamine As the season changes he can taper down use  Other orders -     fluticasone (FLONASE) 50 MCG/ACT nasal spray; Place 2 sprays into both nostrils daily. -     cetirizine (ZYRTEC) 10 MG tablet; Take 1 tablet (10 mg total) by mouth daily.     Adisen Bennion A Jeptha Hinnenkamp

## 2016-06-04 NOTE — Patient Instructions (Addendum)
   IF you received an x-ray today, you will receive an invoice from [facility]. Please contact [facility] at [phone] with questions or concerns regarding your invoice.   IF you received labwork today, you will receive an invoice from [facility]. Please contact [facility] at [phone] with questions or concerns regarding your invoice.   Our billing staff will not be able to assist you with questions regarding bills from these companies.  You will be contacted with the lab results as soon as they are available. The fastest way to get your results is to activate your My Chart account. Instructions are located on the last page of this paperwork. If you have not heard from us regarding the results in 2 weeks, please contact this office.     Earwax Buildup Your ears make a substance called earwax. It may also be called cerumen. Sometimes, too much earwax builds up in your ear canal. This can cause ear pain and make it harder for you to hear. CAUSES This condition is caused by too much earwax production or buildup. RISK FACTORS The following factors may make you more likely to develop this condition:  Cleaning your ears often with swabs.  Having narrow ear canals.  Having earwax that is overly thick or sticky.  Having eczema.  Being dehydrated. SYMPTOMS Symptoms of this condition include:  Reduced hearing.  Ear drainage.  Ear pain.  Ear itch.  A feeling of fullness in the ear or feeling that the ear is plugged.  Ringing in the ear.  Coughing. DIAGNOSIS Your health care provider can diagnose this condition based on your symptoms and medical history. Your health care provider will also do an ear exam to look inside your ear with a scope (otoscope). You may also have a hearing test. TREATMENT Treatment for this condition includes:  Over-the-counter or prescription ear drops to soften the earwax.  Earwax removal by a health care provider. This may be  done:  By flushing the ear with body-temperature water.  With a medical instrument that has a loop at the end (earwax curette).  With a suction device. HOME CARE INSTRUCTIONS  Take over-the-counter and prescription medicines only as told by your health care provider.  Do not put any objects, including an ear swab, into your ear. You can clean the opening of your ear canal with a washcloth.  Drink enough water to keep your urine clear or pale yellow.  If you have frequent earwax buildup or you use hearing aids, consider seeing your health care provider every 6-12 months for routine preventive ear cleanings. Keep all follow-up visits as told by your health care provider. SEEK MEDICAL CARE IF:  You have ear pain.  Your condition does not improve with treatment.  You have hearing loss.  You have blood, pus, or other fluid coming from your ear. This information is not intended to replace advice given to you by your health care provider. Make sure you discuss any questions you have with your health care provider. Document Released: 03/07/2004 Document Revised: 05/22/2015 Document Reviewed: 09/14/2014 Elsevier Interactive Patient Education  2017 [employer]vier Inc.   

## 2016-06-10 ENCOUNTER — Ambulatory Visit (INDEPENDENT_AMBULATORY_CARE_PROVIDER_SITE_OTHER): Payer: BC Managed Care – PPO | Admitting: Physician Assistant

## 2016-06-10 DIAGNOSIS — E291 Testicular hypofunction: Secondary | ICD-10-CM | POA: Diagnosis not present

## 2016-06-10 NOTE — Patient Instructions (Signed)
     IF you received an x-ray today, you will receive an invoice from [facility]. Please contact [facility] at [phone] with questions or concerns regarding your invoice.   IF you received labwork today, you will receive an invoice from [facility]. Please contact [facility] at [phone] with questions or concerns regarding your invoice.   Our billing staff will not be able to assist you with questions regarding bills from these companies.  You will be contacted with the lab results as soon as they are available. The fastest way to get your results is to activate your My Chart account. Instructions are located on the last page of this paperwork. If you have not heard from us regarding the results in 2 weeks, please contact this office.     

## 2016-06-10 NOTE — Progress Notes (Signed)
Pt was here for testosterone injection.  

## 2016-06-22 ENCOUNTER — Other Ambulatory Visit: Payer: Self-pay | Admitting: Emergency Medicine

## 2016-06-22 DIAGNOSIS — E291 Testicular hypofunction: Secondary | ICD-10-CM

## 2016-06-24 ENCOUNTER — Ambulatory Visit (INDEPENDENT_AMBULATORY_CARE_PROVIDER_SITE_OTHER): Payer: BC Managed Care – PPO | Admitting: Physician Assistant

## 2016-06-24 DIAGNOSIS — E291 Testicular hypofunction: Secondary | ICD-10-CM | POA: Diagnosis not present

## 2016-06-24 NOTE — Progress Notes (Signed)
Pt here for testosterone injection

## 2016-06-24 NOTE — Patient Instructions (Signed)
     IF you received an x-ray today, you will receive an invoice from [facility]. Please contact [facility] at [phone] with questions or concerns regarding your invoice.   IF you received labwork today, you will receive an invoice from [facility]. Please contact [facility] at [phone] with questions or concerns regarding your invoice.   Our billing staff will not be able to assist you with questions regarding bills from these companies.  You will be contacted with the lab results as soon as they are available. The fastest way to get your results is to activate your My Chart account. Instructions are located on the last page of this paperwork. If you have not heard from us regarding the results in 2 weeks, please contact this office.     

## 2016-06-25 NOTE — Telephone Encounter (Signed)
Patient notified via My Chart.  Rx printed at 104. Will bring to 102 after clinic.  Meds ordered this encounter  Medications  . testosterone cypionate (DEPOTESTOSTERONE CYPIONATE) 200 MG/ML injection    Sig: INJECT 1 ML (200 MG TOTAL) INTO THE MUSCLE EVERY 14 DAYS    Dispense:  6 mL    Refill:  0    Please notify patient that s/he needs an office visit +/- labsfor additional refills.

## 2016-06-26 ENCOUNTER — Encounter: Payer: BC Managed Care – PPO | Admitting: Family Medicine

## 2016-06-26 NOTE — Telephone Encounter (Signed)
Script faxed to pharmacy

## 2016-07-03 ENCOUNTER — Encounter: Payer: Self-pay | Admitting: Family Medicine

## 2016-07-03 ENCOUNTER — Ambulatory Visit (INDEPENDENT_AMBULATORY_CARE_PROVIDER_SITE_OTHER): Payer: BC Managed Care – PPO | Admitting: Family Medicine

## 2016-07-03 VITALS — BP 115/77 | HR 88 | Temp 98.3°F | Resp 16 | Ht 69.0 in | Wt 140.0 lb

## 2016-07-03 DIAGNOSIS — Z0000 Encounter for general adult medical examination without abnormal findings: Secondary | ICD-10-CM | POA: Diagnosis not present

## 2016-07-03 DIAGNOSIS — N400 Enlarged prostate without lower urinary tract symptoms: Secondary | ICD-10-CM

## 2016-07-03 DIAGNOSIS — Z125 Encounter for screening for malignant neoplasm of prostate: Secondary | ICD-10-CM | POA: Diagnosis not present

## 2016-07-03 DIAGNOSIS — Z5181 Encounter for therapeutic drug level monitoring: Secondary | ICD-10-CM | POA: Diagnosis not present

## 2016-07-03 DIAGNOSIS — E291 Testicular hypofunction: Secondary | ICD-10-CM

## 2016-07-03 MED ORDER — FLUTICASONE PROPIONATE 50 MCG/ACT NA SUSP: 2.0000 | Freq: Every day | NASAL | 6 refills | Status: DC

## 2016-07-03 MED ORDER — PAZEO 0.7 % OP SOLN: 1.0000 [drp] | Freq: Every day | OPHTHALMIC | 0 refills | Status: DC | PRN

## 2016-07-03 MED ORDER — SILDENAFIL CITRATE 100 MG PO TABS: 50.0000 mg | ORAL_TABLET | Freq: Every day | ORAL | 11 refills | Status: DC | PRN

## 2016-07-03 MED ORDER — TESTOSTERONE CYPIONATE 200 MG/ML IM SOLN: INTRAMUSCULAR | 0 refills | Status: DC

## 2016-07-03 NOTE — Telephone Encounter (Signed)
error 

## 2016-07-03 NOTE — Patient Instructions (Addendum)
IF you received an x-ray today, you will receive an invoice from [facility]. Please contact [facility] at [phone] with questions or concerns regarding your invoice.   IF you received labwork today, you will receive an invoice from [facility]. Please contact [facility] at [phone] with questions or concerns regarding your invoice.   Our billing staff will not be able to assist you with questions regarding bills from these companies.  You will be contacted with the lab results as soon as they are available. The fastest way to get your results is to activate your My Chart account. Instructions are located on the last page of this paperwork. If you have not heard from [place] regarding the results in 2 weeks, please contact this office.    Lipid Profile Test Why am I having this test? The lipid profile test gives results that can help predict the likelihood of developing heart disease. The test is also used to monitor treatment for high cholesterol to see if you are reaching your goals. A lipid profile measures the following:  Total cholesterol. Cholesterol is a waxy fat in your blood. If your total cholesterol is elevated, this can increase your risk of coronary heart disease.  High-density lipoprotein (HDL). This is known as the good cholesterol. Having a high level of HDL is good. Your HDL level may be low if you smoke or do not get enough exercise.  Low-density lipoprotein (LDL). This is known as the bad cholesterol and is responsible for the formation of plaque in the arteries. Having a low level of LDL is best.  Cholesterol to HDL ratio. This is calculated by dividing the total cholesterol by the HDL cholesterol. The ratio is used by health care providers for determining your risk of heart disease. A low ratio is best.  Triglycerides. These are a type of fat in the blood responsible for providing energy to your cells. Low levels are best. What kind of sample is  taken? A blood sample is required for this test. It is usually collected by inserting a needle into a vein. How do I prepare for this test? Do not eat or drink anything after midnight on the night before the test or as directed by your health care provider. What are the reference ranges? Reference ranges are considered healthy ranges established after testing a large group of healthy people. Reference ranges may vary among different people, labs, and hospitals. It is your responsibility to obtain your test results. Ask the lab or department performing the test when and how you will get your results. Reference ranges for the lipid profile test are as follows: Total Cholesterol   Adult or elderly: less than 200 mg/dL or less than 5.20 mmol/L (SI units).  Child: 120-200 mg/dL.  Infant: 70-175 mg/dL.  Newborns: 53-135 mg/dL. HDL   Male: greater than 45 mg/dL or greater than 0.75 mmol/L (SI units).  Male: greater than 55 mg/dL or greater than 0.91 mmol/L (SI units). HDL reference values based on risk of heart disease:  For low risk of heart disease:  Male: 60 mg/dL or 1.55 mmol/L.  Male: 70 mg/dL or 1.81 mmol/L.  For moderate risk of heart disease:  Male: 45 mg/dL or 1.17 mmol/L.  Male: 55 mg/dL or 1.42 mmol/L.  For high risk of heart disease:  Male: 25 mg/dL or 0.65 mmol/L.  Male: 35 mg/dL or 0.90 mmol/L. LDL   Adult: less than 130 mg/dL.  Children: less than 110 mg/dL. Cholesterol to HDL  Ratio  Reference values based on risk for coronary heart disease:  Risk that is one half average:  Male: 3.4.  Male: 3.3.  Average risk:  Male: 5.0.  Male: 4.4.  Risk that is two times average (moderate risk):  Male: 10.0.  Male: 7.0.  Risk that is three times average (high risk):    Male: 11.0. Triglycerides   Adult or elderly:  Male: 40-160 mg/dL or 0.45-1.81 mmol/L (SI units).  Male: 35-135 mg/dL or 0.40-1.52 mmol/L (SI units).  Children  41-51 years old:  Male: 30-86 mg/dL.  Male: 32-99 mg/dL.  Children 37-88 years old:  Male: 31-108 mg/dL.  Male: 35-114 mg/dL.  Children 28-56 years old:  Male: 36-138 mg/dL.  Male: 41-138 mg/dL.  Children 46-60 years old:  Male: 40-163 mg/dL.  Male: 40-128 mg/dL. Triglycerides should be less than 400 mg/dL even when you are not fasting. What do the results mean? Talk with your health care provider to discuss your results, treatment options, and if necessary, the need for more tests. Talk with your health care provider if you have any questions about your results. Talk with your health care provider to discuss your results, treatment options, and if necessary, the need for more tests. Talk with your health care provider if you have any questions about your results. This information is not intended to replace advice given to you by your health care provider. Make sure you discuss any questions you have with your health care provider. Document Released: 02/22/2004 Document Revised: 10/04/2015 Document Reviewed: 05/20/2013 Elsevier Interactive Patient Education  2017 Elsevier Inc.  Benign Prostatic Hyperplasia Benign prostatic hyperplasia is when the prostate gland is bigger than normal (enlarged). The prostate is a gland that produces the fluid that goes into semen. It is near the opening to the bladder and it surrounds the tube that drains urine out of the body (urethra). Benign prostatic hyperplasia is common among older men and it typically causes problems with urinating. The prostate grows slowly as you age. As the prostate grows, it can pinch the urethra. This causes the bladder to work too hard to pass urine, which leads to a thickened bladder wall. The bladder may eventually become weak and unable to empty completely. What are the causes? The exact cause of this condition is not known. It may be related to changes in hormones as the body ages. What increases the  risk? You are more likely to develop this condition if:  You have a family history of the condition.  You are age 66 or older.  You have a history of erectile dysfunction.  You do not exercise.  You have certain medical conditions, including:  Type 2 diabetes.  Obesity.  Heart and circulatory disease. What are the signs or symptoms? Symptoms of this condition include:  Weak or interrupted urine stream.  Dribbling or leaking urine.  Feeling like the bladder has not emptied completely.  Difficulty starting urination.  Getting up frequently at night to urinate.  Urinating more often (8 or more times a day).  Accidental loss of urine (urinary incontinence).  Pain during urination or ejaculation.  Urine with an unusual smell or color. The size of the prostate does not always determine the severity of the symptoms. For example, a man with a large prostate may experience minor symptoms, or a man with a smaller prostate may experience a severe blockage. How is this diagnosed? This condition may be diagnosed based on:  Your medical history and symptoms.  A physical  exam. This usually includes a digital rectal exam. During this exam, your health care provider places a gloved, lubricated finger into the rectum to feel the size of the prostate.  A blood test. This test checks for high levels of a protein that is produced by the prostate (prostate specific antigen, PSA).  Tests to examine how well the urethra and bladder are functioning (urodynamic tests).  Cystoscopy. For this test, a small, tube-shaped instrument (cystoscope) is used to look inside the urethra and bladder. The cystoscope is placed into the urinary tract through the opening at the tip of the penis.  Urine tests.  Ultrasound. How is this treated? Treatment for this condition depends on how severe your symptoms are. Treatment may include:  Active surveillance or "watchful waiting." If your symptoms are  mild, your health care provider may delay treatment and ask you to keep track of your symptoms. You will have regular checkups to examine the size of your prostate, discuss symptoms, and determine whether treatment is needed.  Medicines. These may be used to:  Stop prostate growth.  Shrink the prostate.  Relieve symptoms.  Lifestyle changes, including:  Pelvic floor muscle exercises. The pelvic floor muscles are a group of muscles that relax when you urinate.  Bladder training. This involves exercises that train the bladder to hold more urine for longer periods.  Reducing the amount of liquid that you drink. This is especially important before sleeping and before long periods of time spent in public.  Reducing the amount of caffeine and alcohol that you drink.  Treating or preventing constipation.  Surgery to reduce the size of the prostate or widen the urethra. This is typically done if your symptoms are severe or there are serious complications from the enlarged prostate. Follow these instructions at home: Medicines   Take over-the-counter and prescription medicines as told by your health care provider.  Avoid certain medicines, such as decongestants, antihistamines, and some prescription medicines as told by your health care provider. Ask your health care provider which medicines you should avoid. General instructions   Monitor your symptoms for any changes. Tell your health care provider about any changes.  Give yourself time when you urinate.  Avoid certain beverages that can irritate the bladder, such as:  Alcohol.  Caffeinated drinks like coffee, tea, and cola.  Avoid drinking large amounts of liquid before bed or before going out in public.  Do pelvic floor muscle or bladder training exercises as told by your health care provider.  Keep all follow-up visits as told by your health care provider. This is important. Contact a health care provider if:  Your develop  new or worse symptoms.  You have trouble getting or maintaining an erection.  You have a fever.  You have pain or burning during urination.  You have blood in your urine. Get help right away if:  You have severe pain when urinating.  You cannot urinate.  You have severe pain in your abdomen.  You are dizzy.  You faint.  You have severe back pain.  Your urine is dark red and difficult to see through.  You have large blood clots in your urine.  You have severe pain after an erection.  You have chest pain, dizziness, or nausea during sexual activity. Summary  The prostate is a gland that produces the fluid that goes into semen. It is near the opening to the bladder and it surrounds the tube that drains urine out of the body (urethra).  Benign prostatic hyperplasia is common among older men and it typically causes problems with urinating.  If your symptoms are mild, your health care provider may delay treatment and ask you to keep track of your symptoms. You will have regular checkups to examine the size of your prostate, discuss symptoms, and determine whether treatment is needed.  If directed, you may need to avoid certain medicines, such as decongestants, antihistamines, and some prescription medicines.  Contact your health care provider if you develop new or worse symptoms. This information is not intended to replace advice given to you by your health care provider. Make sure you discuss any questions you have with your health care provider. Document Released: 01/28/2005 Document Revised: 12/18/2015 Document Reviewed: 12/18/2015 Elsevier Interactive Patient Education  2017 [employer].

## 2016-07-03 NOTE — Progress Notes (Signed)
Chief Complaint  Patient presents with  . Annual Exam  . Medication Refill    testosterone cypionate,viagra and medication with RFs beside them    Subjective:  Marcus Gutierrez is a 56 y.o. male here for a health maintenance visit.  Patient is established pt  Patient Active Problem List   Diagnosis Date Noted  . Depression 03/05/2015  . Rising PSA level 02/13/2015  . Colitis 12/11/2011  . Hypogonadism male 11/29/2011    Past Medical History:  Diagnosis Date  . Allergy   . GERD (gastroesophageal reflux disease)   . Seizures (HCC)   . Ulcer   . Ulcerative colitis [facility])     Past Surgical History:  Procedure Laterality Date  . HERNIA REPAIR       Outpatient Medications Prior to Visit  Medication Sig Dispense Refill  . alum hydroxide-mag trisilicate (GAVISCON) 80-20 MG CHEW chewable tablet Chew by mouth.    . Budesonide (UCERIS PO) Take by mouth.    . cetirizine (ZYRTEC) 10 MG tablet Take 1 tablet (10 mg total) by mouth daily. 30 tablet 11  . Cholecalciferol (VITAMIN D3) 1000 UNITS CAPS Take 1 capsule by mouth daily.    Marland Kitchen esomeprazole (NEXIUM) 40 MG capsule Take 1 capsule (40 mg total) by mouth daily before breakfast. 30 capsule 12  . mesalamine (LIALDA) 1.2 G EC tablet Take 1,200 mg by mouth daily with breakfast.    . fluticasone (FLONASE) 50 MCG/ACT nasal spray Place 2 sprays into both nostrils daily. 16 g 6  . sildenafil (VIAGRA) 100 MG tablet Take 0.5-1 tablets (50-100 mg total) by mouth daily as needed for erectile dysfunction. 5 tablet 11  . testosterone cypionate (DEPOTESTOSTERONE CYPIONATE) 200 MG/ML injection INJECT 1 ML (200 MG TOTAL) INTO THE MUSCLE EVERY 14 DAYS 6 mL 0  . butalbital-acetaminophen-caffeine (FIORICET, ESGIC) 50-325-40 MG tablet Take 1 tablet by mouth every 6 (six) hours as needed for headache. (Patient not taking: Reported on 07/03/2016) 14 tablet 0  . PAZEO 0.7 % SOLN   0   Facility-Administered Medications Prior to Visit  Medication Dose Route  Frequency Provider Last Rate Last Dose  . testosterone cypionate (DEPOTESTOSTERONE CYPIONATE) injection 200 mg  200 mg Intramuscular Q14 Days Ofilia Neas, PA-C   200 mg at 06/24/16 1308    Allergies  Allergen Reactions  . Amoxicillin Other (See Comments)    hematochezia  . Androgel [Testosterone]      Family History  Problem Relation Age of Onset  . Cancer Mother   . Heart attack Mother   . Heart attack Father   . Lupus Sister   . Asthma Son      Health Habits: Dental Exam: up to date Eye Exam: up to date Exercise: 5 times/week on average Current exercise activities: walking/running   Social History   Social History  . Marital status: Married    Spouse name: N/A  . Number of children: N/A  . Years of education: N/A   Occupational History  . Not on file.   Social History Main Topics  . Smoking status: Former Smoker    Quit date: 06/01/2003  . Smokeless tobacco: Never Used  . Alcohol use No  . Drug use: No  . Sexual activity: Not on file   Other Topics Concern  . Not on file   Social History Narrative  . No narrative on file   History  Alcohol Use No   History  Smoking Status  . Former Smoker  . Quit  date: 06/01/2003  Smokeless Tobacco  . Never Used   History  Drug Use No     Health Maintenance: See under health Maintenance activity for review of completion dates as well. Immunization History  Administered Date(s) Administered  . Influenza,inj,Quad PF,36+ Mos 01/05/2013, 01/24/2014, 10/18/2014  . Td 12/12/2006      Depression Screen-PHQ2/9 Depression screen [facility] 2/9 06/04/2016 07/28/2015 07/15/2015 03/05/2015 02/13/2015  Decreased Interest 0 0 0 0 0  Down, Depressed, Hopeless 0 0 0 0 0  PHQ - 2 Score 0 0 0 0 0       Depression Severity and Treatment Recommendations:  0-4= None  5-9= Mild / Treatment: Support, educate to call if worse; return in one month  10-14= Moderate / Treatment: Support, watchful waiting; Antidepressant or  Psycotherapy  15-19= Moderately severe / Treatment: Antidepressant OR Psychotherapy  >= 20 = Major depression, severe / Antidepressant AND Psychotherapy    Review of Systems   Review of Systems  Constitutional: Negative for chills, fever and weight loss.  HENT: Negative for hearing loss and tinnitus.   Eyes: Negative for blurred vision, double vision and photophobia.  Respiratory: Negative for cough and wheezing.   Cardiovascular: Negative for chest pain, palpitations and orthopnea.  Gastrointestinal: Negative for abdominal pain, nausea and vomiting.  Genitourinary: Negative for dysuria, frequency and urgency.  Skin: Negative for itching and rash.  Neurological: Negative for dizziness, tingling and headaches.  Psychiatric/Behavioral: Negative for depression. The patient is not nervous/anxious.     See HPI for ROS as well.    Objective:   Vitals:   07/03/16 1137  BP: 115/77  Pulse: 88  Resp: 16  Temp: 98.3 F (36.8 C)  TempSrc: Oral  SpO2: 98%  Weight: 140 lb (63.5 kg)  Height: 5\' 9"  (1.753 m)    Body mass index is 20.67 kg/m.  Physical Exam  Constitutional: He is oriented to person, place, and time. He appears well-developed and well-nourished.  HENT:  Head: Normocephalic.  Right Ear: External ear normal.  Left Ear: External ear normal.  Nose: Nose normal.  Mouth/Throat: Oropharynx is clear and moist.  Neck: Normal range of motion. Neck supple. No thyromegaly present.  Cardiovascular: Normal rate, regular rhythm and normal heart sounds.   Pulmonary/Chest: Breath sounds normal. No respiratory distress. He has no wheezes. He has no rales.  Abdominal: Soft. He exhibits no distension and no mass. There is no tenderness. There is no rebound and no guarding.  Genitourinary: Penis normal.  Genitourinary Comments: Chaperone present Enlarged, nontender, nonnodular prostate  Musculoskeletal: Normal range of motion. He exhibits no edema.  Neurological: He is alert and  oriented to person, place, and time. He has normal reflexes.  Skin: Skin is warm. No erythema.  Psychiatric: He has a normal mood and affect. His behavior is normal. Judgment and thought content normal.       Assessment/Plan:   Patient was seen for a health maintenance exam.  Counseled the patient on health maintenance issues. Reviewed her health mainteance schedule and ordered appropriate tests (see orders.) Counseled on regular exercise and weight management. Recommend regular eye exams and dental cleaning.   The following issues were addressed today for health maintenance:   Marcus Gutierrez was seen today for annual exam and medication refill.  Diagnoses and all orders for this visit:  Health maintenance examination- reviewed screenings Pt scheduled for cscopy -     Comprehensive metabolic panel -     Lipid panel -     PSA  Encounter for medication monitoring -     Testosterone,Free and Total  Screening for prostate cancer- discussed prostate screening -     PSA  Hypogonadism male- will refill today , check liver function and test levels -     testosterone cypionate (DEPOTESTOSTERONE CYPIONATE) 200 MG/ML injection; INJECT 1 ML (200 MG TOTAL) INTO THE MUSCLE EVERY 14 DAYS  Benign prostatic hyperplasia without lower urinary tract symptoms -  Advised that if PSA changes then would recommend urology follow up If he develops urinary symptoms he should see Urology to discuss wether testosterone is stimulating hyperplasia.   Other orders -     fluticasone (FLONASE) 50 MCG/ACT nasal spray; Place 2 sprays into both nostrils daily. -     sildenafil (VIAGRA) 100 MG tablet; Take 0.5-1 tablets (50-100 mg total) by mouth daily as needed for erectile dysfunction. -     PAZEO 0.7 % SOLN; Place 1 drop into both eyes daily as needed.    No Follow-up on file.    Body mass index is 20.67 kg/m.:  Discussed the patient's BMI with patient. The BMI body mass index is 20.67 kg/m.     Future  Appointments Date Time Provider Department Center  01/08/2017 11:40 AM Doristine Bosworth, MD PCP-PCP [facility]    Patient Instructions       IF you received an x-ray today, you will receive an invoice from [facility]. Please contact [facility] at [phone] with questions or concerns regarding your invoice.   IF you received labwork today, you will receive an invoice from [facility]. Please contact [facility] at [phone] with questions or concerns regarding your invoice.   Our billing staff will not be able to assist you with questions regarding bills from these companies.  You will be contacted with the lab results as soon as they are available. The fastest way to get your results is to activate your My Chart account. Instructions are located on the last page of this paperwork. If you have not heard from [place] regarding the results in 2 weeks, please contact this office.    Lipid Profile Test Why am I having this test? The lipid profile test gives results that can help predict the likelihood of developing heart disease. The test is also used to monitor treatment for high cholesterol to see if you are reaching your goals. A lipid profile measures the following:  Total cholesterol. Cholesterol is a waxy fat in your blood. If your total cholesterol is elevated, this can increase your risk of coronary heart disease.  High-density lipoprotein (HDL). This is known as the good cholesterol. Having a high level of HDL is good. Your HDL level may be low if you smoke or do not get enough exercise.  Low-density lipoprotein (LDL). This is known as the bad cholesterol and is responsible for the formation of plaque in the arteries. Having a low level of LDL is best.  Cholesterol to HDL ratio. This is calculated by dividing the total cholesterol by the HDL cholesterol. The ratio is used by health care providers for determining your risk of heart disease. A low ratio is  best.  Triglycerides. These are a type of fat in the blood responsible for providing energy to your cells. Low levels are best. What kind of sample is taken? A blood sample is required for this test. It is usually collected by inserting a needle into a vein. How do I prepare for this test? Do not eat or drink anything after midnight  on the night before the test or as directed by your health care provider. What are the reference ranges? Reference ranges are considered healthy ranges established after testing a large group of healthy people. Reference ranges may vary among different people, labs, and hospitals. It is your responsibility to obtain your test results. Ask the lab or department performing the test when and how you will get your results. Reference ranges for the lipid profile test are as follows: Total Cholesterol   Adult or elderly: less than 200 mg/dL or less than 6.965.20 mmol/L (SI units).  Child: 120-200 mg/dL.  Infant: 70-175 mg/dL.  Newborns: 53-135 mg/dL. HDL   Male: greater than 45 mg/dL or greater than 2.950.75 mmol/L (SI units).  Male: greater than 55 mg/dL or greater than 2.840.91 mmol/L (SI units). HDL reference values based on risk of heart disease:  For low risk of heart disease:  Male: 60 mg/dL or 1.321.55 mmol/L.  Male: 70 mg/dL or 4.401.81 mmol/L.  For moderate risk of heart disease:  Male: 45 mg/dL or 1.021.17 mmol/L.  Male: 55 mg/dL or 7.251.42 mmol/L.  For high risk of heart disease:  Male: 25 mg/dL or 3.660.65 mmol/L.  Male: 35 mg/dL or 4.400.90 mmol/L. LDL   Adult: less than 130 mg/dL.  Children: less than 110 mg/dL. Cholesterol to HDL Ratio  Reference values based on risk for coronary heart disease:  Risk that is one half average:  Male: 3.4.  Male: 3.3.  Average risk:  Male: 5.0.  Male: 4.4.  Risk that is two times average (moderate risk):  Male: 10.0.  Male: 7.0.  Risk that is three times average (high risk):    Male:  11.0. Triglycerides   Adult or elderly:  Male: 40-160 mg/dL or 3.47-4.250.45-1.81 mmol/L (SI units).  Male: 35-135 mg/dL or 9.56-3.870.40-1.52 mmol/L (SI units).  Children 260-55 years old:  Male: 30-86 mg/dL.  Male: 32-99 mg/dL.  Children 316-55 years old:  Male: 31-108 mg/dL.  Male: 35-114 mg/dL.  Children 5912-29139 years old:  Male: 36-138 mg/dL.  Male: 41-138 mg/dL.  Children 6516-55 years old:  Male: 40-163 mg/dL.  Male: 40-128 mg/dL. Triglycerides should be less than 400 mg/dL even when you are not fasting. What do the results mean? Talk with your health care provider to discuss your results, treatment options, and if necessary, the need for more tests. Talk with your health care provider if you have any questions about your results. Talk with your health care provider to discuss your results, treatment options, and if necessary, the need for more tests. Talk with your health care provider if you have any questions about your results. This information is not intended to replace advice given to you by your health care provider. Make sure you discuss any questions you have with your health care provider. Document Released: 02/22/2004 Document Revised: 10/04/2015 Document Reviewed: 05/20/2013 Elsevier Interactive Patient Education  2017 [employer] Inc[employer]  Benign Prostatic Hyperplasia Benign prostatic hyperplasia is when the prostate gland is bigger than normal (enlarged). The prostate is a gland that produces the fluid that goes into semen. It is near the opening to the bladder and it surrounds the tube that drains urine out of the body (urethra). Benign prostatic hyperplasia is common among older men and it typically causes problems with urinating. The prostate grows slowly as you age. As the prostate grows, it can pinch the urethra. This causes the bladder to work too hard to pass urine, which leads to a thickened bladder wall. The bladder may  eventually become weak and unable to empty  completely. What are the causes? The exact cause of this condition is not known. It may be related to changes in hormones as the body ages. What increases the risk? You are more likely to develop this condition if:  You have a family history of the condition.  You are age 22 or older.  You have a history of erectile dysfunction.  You do not exercise.  You have certain medical conditions, including:  Type 2 diabetes.  Obesity.  Heart and circulatory disease. What are the signs or symptoms? Symptoms of this condition include:  Weak or interrupted urine stream.  Dribbling or leaking urine.  Feeling like the bladder has not emptied completely.  Difficulty starting urination.  Getting up frequently at night to urinate.  Urinating more often (8 or more times a day).  Accidental loss of urine (urinary incontinence).  Pain during urination or ejaculation.  Urine with an unusual smell or color. The size of the prostate does not always determine the severity of the symptoms. For example, a man with a large prostate may experience minor symptoms, or a man with a smaller prostate may experience a severe blockage. How is this diagnosed? This condition may be diagnosed based on:  Your medical history and symptoms.  A physical exam. This usually includes a digital rectal exam. During this exam, your health care provider places a gloved, lubricated finger into the rectum to feel the size of the prostate.  A blood test. This test checks for high levels of a protein that is produced by the prostate (prostate specific antigen, PSA).  Tests to examine how well the urethra and bladder are functioning (urodynamic tests).  Cystoscopy. For this test, a small, tube-shaped instrument (cystoscope) is used to look inside the urethra and bladder. The cystoscope is placed into the urinary tract through the opening at the tip of the penis.  Urine tests.  Ultrasound. How is this  treated? Treatment for this condition depends on how severe your symptoms are. Treatment may include:  Active surveillance or "watchful waiting." If your symptoms are mild, your health care provider may delay treatment and ask you to keep track of your symptoms. You will have regular checkups to examine the size of your prostate, discuss symptoms, and determine whether treatment is needed.  Medicines. These may be used to:  Stop prostate growth.  Shrink the prostate.  Relieve symptoms.  Lifestyle changes, including:  Pelvic floor muscle exercises. The pelvic floor muscles are a group of muscles that relax when you urinate.  Bladder training. This involves exercises that train the bladder to hold more urine for longer periods.  Reducing the amount of liquid that you drink. This is especially important before sleeping and before long periods of time spent in public.  Reducing the amount of caffeine and alcohol that you drink.  Treating or preventing constipation.  Surgery to reduce the size of the prostate or widen the urethra. This is typically done if your symptoms are severe or there are serious complications from the enlarged prostate. Follow these instructions at home: Medicines   Take over-the-counter and prescription medicines as told by your health care provider.  Avoid certain medicines, such as decongestants, antihistamines, and some prescription medicines as told by your health care provider. Ask your health care provider which medicines you should avoid. General instructions   Monitor your symptoms for any changes. Tell your health care provider about any changes.  Give yourself time  when you urinate.  Avoid certain beverages that can irritate the bladder, such as:  Alcohol.  Caffeinated drinks like coffee, tea, and cola.  Avoid drinking large amounts of liquid before bed or before going out in public.  Do pelvic floor muscle or bladder training exercises as  told by your health care provider.  Keep all follow-up visits as told by your health care provider. This is important. Contact a health care provider if:  Your develop new or worse symptoms.  You have trouble getting or maintaining an erection.  You have a fever.  You have pain or burning during urination.  You have blood in your urine. Get help right away if:  You have severe pain when urinating.  You cannot urinate.  You have severe pain in your abdomen.  You are dizzy.  You faint.  You have severe back pain.  Your urine is dark red and difficult to see through.  You have large blood clots in your urine.  You have severe pain after an erection.  You have chest pain, dizziness, or nausea during sexual activity. Summary  The prostate is a gland that produces the fluid that goes into semen. It is near the opening to the bladder and it surrounds the tube that drains urine out of the body (urethra).  Benign prostatic hyperplasia is common among older men and it typically causes problems with urinating.  If your symptoms are mild, your health care provider may delay treatment and ask you to keep track of your symptoms. You will have regular checkups to examine the size of your prostate, discuss symptoms, and determine whether treatment is needed.  If directed, you may need to avoid certain medicines, such as decongestants, antihistamines, and some prescription medicines.  Contact your health care provider if you develop new or worse symptoms. This information is not intended to replace advice given to you by your health care provider. Make sure you discuss any questions you have with your health care provider. Document Released: 01/28/2005 Document Revised: 12/18/2015 Document Reviewed: 12/18/2015 Elsevier Interactive Patient Education  2017 [employer].

## 2016-07-04 LAB — COMPREHENSIVE METABOLIC PANEL
ALT: 3 IU/L (ref 0–44)
AST: 12 IU/L (ref 0–40)
Albumin/Globulin Ratio: 1.2 (ref 1.2–2.2)
Albumin: 4.2 g/dL (ref 3.5–5.5)
Alkaline Phosphatase: 92 IU/L (ref 39–117)
BUN/Creatinine Ratio: 8 — ABNORMAL LOW (ref 9–20)
BUN: 8 mg/dL (ref 6–24)
Bilirubin Total: 0.4 mg/dL (ref 0.0–1.2)
CO2: 26 mmol/L (ref 18–29)
Calcium: 9.3 mg/dL (ref 8.7–10.2)
Chloride: 101 mmol/L (ref 96–106)
Creatinine, Ser: 1.01 mg/dL (ref 0.76–1.27)
GFR calc Af Amer: 96 mL/min/{1.73_m2} (ref >59)
GFR calc non Af Amer: 83 mL/min/{1.73_m2} (ref >59)
Globulin, Total: 3.6 g/dL (ref 1.5–4.5)
Glucose: 86 mg/dL (ref 65–99)
Potassium: 4.2 mmol/L (ref 3.5–5.2)
Sodium: 141 mmol/L (ref 134–144)
Total Protein: 7.8 g/dL (ref 6.0–8.5)

## 2016-07-04 LAB — LIPID PANEL
Chol/HDL Ratio: 1.8 ratio (ref 0.0–5.0)
Cholesterol, Total: 129 mg/dL (ref 100–199)
HDL: 72 mg/dL (ref >39)
LDL Calculated: 49 mg/dL (ref 0–99)
Triglycerides: 40 mg/dL (ref 0–149)
VLDL Cholesterol Cal: 8 mg/dL (ref 5–40)

## 2016-07-04 LAB — PSA: Prostate Specific Ag, Serum: 2.4 ng/mL (ref 0.0–4.0)

## 2016-07-04 LAB — TESTOSTERONE,FREE AND TOTAL
Testosterone, Free: 18 pg/mL (ref 7.2–24.0)
Testosterone: 812 ng/dL (ref 264–916)

## 2016-07-04 NOTE — Telephone Encounter (Signed)
Request for testosterone was sent to plan via cover my meds today.  They are reviewing the case, and it could take 3-5 days to reach an outcome.  They ask that we do not contact them until after the 5 business days.  We should each an outcome prior to the 5th business day.

## 2016-07-07 ENCOUNTER — Encounter: Payer: Self-pay | Admitting: Family Medicine

## 2016-07-10 ENCOUNTER — Telehealth: Payer: Self-pay

## 2016-07-10 NOTE — Telephone Encounter (Signed)
Your information has been submitted to [employer]. To check for an updated outcome later, reopen this PA request from your dashboard. If [employer] has not responded to your request within 24 hours, contact [employer] at [phone]. If you think there may be a problem with your PA request, use our live chat feature at the bottom right.   Testosterone pa started

## 2016-07-12 NOTE — Telephone Encounter (Signed)
Testosterone approved 07/10/16-07/10/17 pt advised

## 2016-07-15 ENCOUNTER — Ambulatory Visit: Payer: BC Managed Care – PPO | Admitting: Physician Assistant

## 2016-07-17 ENCOUNTER — Ambulatory Visit: Payer: BC Managed Care – PPO | Admitting: Physician Assistant

## 2016-07-17 ENCOUNTER — Ambulatory Visit (INDEPENDENT_AMBULATORY_CARE_PROVIDER_SITE_OTHER): Payer: BC Managed Care – PPO | Admitting: Physician Assistant

## 2016-07-17 DIAGNOSIS — E291 Testicular hypofunction: Secondary | ICD-10-CM

## 2016-07-17 NOTE — Progress Notes (Signed)
Pt was here for testosterone injection.  

## 2016-07-17 NOTE — Patient Instructions (Signed)
     IF you received an x-ray today, you will receive an invoice from [facility]. Please contact [facility] at [phone] with questions or concerns regarding your invoice.   IF you received labwork today, you will receive an invoice from [facility]. Please contact [facility] at [phone] with questions or concerns regarding your invoice.   Our billing staff will not be able to assist you with questions regarding bills from these companies.  You will be contacted with the lab results as soon as they are available. The fastest way to get your results is to activate your My Chart account. Instructions are located on the last page of this paperwork. If you have not heard from us regarding the results in 2 weeks, please contact this office.     

## 2016-08-05 ENCOUNTER — Ambulatory Visit (INDEPENDENT_AMBULATORY_CARE_PROVIDER_SITE_OTHER): Payer: BC Managed Care – PPO | Admitting: Physician Assistant

## 2016-08-05 DIAGNOSIS — E291 Testicular hypofunction: Secondary | ICD-10-CM

## 2016-08-05 NOTE — Progress Notes (Signed)
Pt here for testosterone injection

## 2016-08-19 ENCOUNTER — Ambulatory Visit: Payer: BC Managed Care – PPO | Admitting: Physician Assistant

## 2016-08-21 ENCOUNTER — Ambulatory Visit (INDEPENDENT_AMBULATORY_CARE_PROVIDER_SITE_OTHER): Payer: BC Managed Care – PPO | Admitting: Family Medicine

## 2016-08-21 DIAGNOSIS — E291 Testicular hypofunction: Secondary | ICD-10-CM | POA: Diagnosis not present

## 2016-08-21 NOTE — Progress Notes (Signed)
Pt here for testosterone injection

## 2016-09-02 ENCOUNTER — Ambulatory Visit (INDEPENDENT_AMBULATORY_CARE_PROVIDER_SITE_OTHER): Payer: BC Managed Care – PPO | Admitting: Physician Assistant

## 2016-09-02 DIAGNOSIS — E291 Testicular hypofunction: Secondary | ICD-10-CM

## 2016-09-02 NOTE — Progress Notes (Signed)
Pt was here for testosterone shot

## 2016-09-02 NOTE — Patient Instructions (Signed)
     IF you received an x-ray today, you will receive an invoice from [facility]. Please contact [facility] at [phone] with questions or concerns regarding your invoice.   IF you received labwork today, you will receive an invoice from [facility]. Please contact [facility] at [phone] with questions or concerns regarding your invoice.   Our billing staff will not be able to assist you with questions regarding bills from these companies.  You will be contacted with the lab results as soon as they are available. The fastest way to get your results is to activate your My Chart account. Instructions are located on the last page of this paperwork. If you have not heard from us regarding the results in 2 weeks, please contact this office.     

## 2016-09-16 ENCOUNTER — Ambulatory Visit (INDEPENDENT_AMBULATORY_CARE_PROVIDER_SITE_OTHER): Payer: BC Managed Care – PPO | Admitting: Physician Assistant

## 2016-09-16 DIAGNOSIS — E291 Testicular hypofunction: Secondary | ICD-10-CM

## 2016-09-16 NOTE — Patient Instructions (Signed)
     IF you received an x-ray today, you will receive an invoice from [facility]. Please contact [facility] at [phone] with questions or concerns regarding your invoice.   IF you received labwork today, you will receive an invoice from [facility]. Please contact [facility] at [phone] with questions or concerns regarding your invoice.   Our billing staff will not be able to assist you with questions regarding bills from these companies.  You will be contacted with the lab results as soon as they are available. The fastest way to get your results is to activate your My Chart account. Instructions are located on the last page of this paperwork. If you have not heard from us regarding the results in 2 weeks, please contact this office.     

## 2016-09-16 NOTE — Progress Notes (Signed)
Pt here for testosterone injection

## 2016-09-17 ENCOUNTER — Other Ambulatory Visit: Payer: Self-pay | Admitting: Physician Assistant

## 2016-09-17 DIAGNOSIS — E291 Testicular hypofunction: Secondary | ICD-10-CM

## 2016-09-30 ENCOUNTER — Ambulatory Visit (INDEPENDENT_AMBULATORY_CARE_PROVIDER_SITE_OTHER): Payer: BC Managed Care – PPO | Admitting: Physician Assistant

## 2016-09-30 DIAGNOSIS — E291 Testicular hypofunction: Secondary | ICD-10-CM

## 2016-09-30 MED ORDER — TESTOSTERONE CYPIONATE 200 MG/ML IM SOLN
200.0000 mg | INTRAMUSCULAR | Status: DC
  Administered 2016-09-30 – 2016-10-28 (×3): 200 mg via INTRAMUSCULAR

## 2016-09-30 NOTE — Patient Instructions (Signed)
     IF you received an x-ray today, you will receive an invoice from [facility]. Please contact  Radiology at [phone] with questions or concerns regarding your invoice.   IF you received labwork today, you will receive an invoice from [facility]. Please contact [facility] at [phone] with questions or concerns regarding your invoice.   Our billing staff will not be able to assist you with questions regarding bills from these companies.  You will be contacted with the lab results as soon as they are available. The fastest way to get your results is to activate your My Chart account. Instructions are located on the last page of this paperwork. If you have not heard from us regarding the results in 2 weeks, please contact this office.     

## 2016-09-30 NOTE — Addendum Note (Signed)
Addended by: Beau Fanny on: 09/30/2016 01:30 PM   Modules accepted: Orders

## 2016-09-30 NOTE — Progress Notes (Signed)
Pt here for testosterone injection

## 2016-10-15 ENCOUNTER — Ambulatory Visit (INDEPENDENT_AMBULATORY_CARE_PROVIDER_SITE_OTHER): Payer: BC Managed Care – PPO | Admitting: Physician Assistant

## 2016-10-15 DIAGNOSIS — E291 Testicular hypofunction: Secondary | ICD-10-CM

## 2016-10-15 NOTE — Progress Notes (Signed)
Pt was here for testosterone injection.  

## 2016-10-15 NOTE — Patient Instructions (Signed)
     IF you received an x-ray today, you will receive an invoice from [facility]. Please contact  Radiology at [phone] with questions or concerns regarding your invoice.   IF you received labwork today, you will receive an invoice from [facility]. Please contact [facility] at [phone] with questions or concerns regarding your invoice.   Our billing staff will not be able to assist you with questions regarding bills from these companies.  You will be contacted with the lab results as soon as they are available. The fastest way to get your results is to activate your My Chart account. Instructions are located on the last page of this paperwork. If you have not heard from us regarding the results in 2 weeks, please contact this office.     

## 2016-10-28 ENCOUNTER — Encounter: Payer: Self-pay | Admitting: Family Medicine

## 2016-10-28 ENCOUNTER — Ambulatory Visit (INDEPENDENT_AMBULATORY_CARE_PROVIDER_SITE_OTHER): Payer: BC Managed Care – PPO | Admitting: Family Medicine

## 2016-10-28 DIAGNOSIS — E291 Testicular hypofunction: Secondary | ICD-10-CM | POA: Diagnosis not present

## 2016-10-28 NOTE — Progress Notes (Signed)
Pt here for testosterone injection

## 2016-10-28 NOTE — Patient Instructions (Signed)
     IF you received an x-ray today, you will receive an invoice from [facility]. Please contact [facility] at [phone] with questions or concerns regarding your invoice.   IF you received labwork today, you will receive an invoice from [facility]. Please contact [facility] at [phone] with questions or concerns regarding your invoice.   Our billing staff will not be able to assist you with questions regarding bills from these companies.  You will be contacted with the lab results as soon as they are available. The fastest way to get your results is to activate your My Chart account. Instructions are located on the last page of this paperwork. If you have not heard from us regarding the results in 2 weeks, please contact this office.     

## 2016-11-11 ENCOUNTER — Ambulatory Visit (INDEPENDENT_AMBULATORY_CARE_PROVIDER_SITE_OTHER): Payer: BC Managed Care – PPO | Admitting: Family Medicine

## 2016-11-11 ENCOUNTER — Encounter: Payer: Self-pay | Admitting: Family Medicine

## 2016-11-11 DIAGNOSIS — E291 Testicular hypofunction: Secondary | ICD-10-CM | POA: Diagnosis not present

## 2016-11-11 MED ORDER — TESTOSTERONE CYPIONATE 200 MG/ML IM SOLN
200.0000 mg | INTRAMUSCULAR | Status: DC
  Administered 2016-11-11 – 2018-03-02 (×25): 200 mg via INTRAMUSCULAR

## 2016-11-11 NOTE — Progress Notes (Signed)
Pt was here for testosterone shot.

## 2016-11-15 ENCOUNTER — Encounter: Payer: Self-pay | Admitting: Family Medicine

## 2016-11-25 ENCOUNTER — Ambulatory Visit (INDEPENDENT_AMBULATORY_CARE_PROVIDER_SITE_OTHER): Payer: BC Managed Care – PPO | Admitting: Physician Assistant

## 2016-11-25 DIAGNOSIS — E291 Testicular hypofunction: Secondary | ICD-10-CM | POA: Diagnosis not present

## 2016-12-09 ENCOUNTER — Ambulatory Visit (INDEPENDENT_AMBULATORY_CARE_PROVIDER_SITE_OTHER): Payer: BC Managed Care – PPO | Admitting: Family Medicine

## 2016-12-09 DIAGNOSIS — E291 Testicular hypofunction: Secondary | ICD-10-CM

## 2016-12-11 NOTE — Progress Notes (Signed)
Not seen by provider 

## 2016-12-23 ENCOUNTER — Other Ambulatory Visit: Payer: Self-pay | Admitting: Family Medicine

## 2016-12-23 ENCOUNTER — Telehealth: Payer: Self-pay

## 2016-12-23 ENCOUNTER — Ambulatory Visit (INDEPENDENT_AMBULATORY_CARE_PROVIDER_SITE_OTHER): Payer: BC Managed Care – PPO | Admitting: Urgent Care

## 2016-12-23 DIAGNOSIS — E291 Testicular hypofunction: Secondary | ICD-10-CM | POA: Diagnosis not present

## 2016-12-23 NOTE — Telephone Encounter (Signed)
Patient in office, requesting refills for testosterone. Call sent to Dr Creta LevinStallings

## 2016-12-23 NOTE — Telephone Encounter (Signed)
Request for testerone

## 2016-12-25 ENCOUNTER — Telehealth: Payer: Self-pay | Admitting: Family Medicine

## 2016-12-25 ENCOUNTER — Telehealth: Payer: Self-pay

## 2016-12-25 NOTE — Telephone Encounter (Signed)
Sent testosterone cypionate 200 mg/ml injection [facility] at [phone].  Confirmation fax received at 4:07 pm. Dgaddy, CMA

## 2016-12-25 NOTE — Telephone Encounter (Signed)
testosterone cypionate (DEPOTESTOSTERONE CYPIONATE) 200 MG/ML injection 6 mL 0 12/25/2016    Sig: INJECT 1 ML INTO THE MUSCLE EVERY 14 DAYS   Class: Print   Notes to Pharmacy: Not to exceed 5 additional fills before 12/30/2016    / Looks like the prescription was printed.  [facility] [address].  Phone # [phone] Fax # [phone]  Copied from CRM [id number]. Topic: Quick Communication - See Telephone Encounter >> Dec 25, 2016  2:37 PM Waymon AmatoBurton, Donna F wrote: CRM for notification. See Telephone encounter for: 12/25/16. [facility] of his testosterone refill they state that they have e=scribed it twice already   [facility] [phone]  best number for pt is [phone]

## 2016-12-27 NOTE — Telephone Encounter (Signed)
Spoke to pharmacy - prescription received 11/14

## 2017-01-06 ENCOUNTER — Ambulatory Visit (INDEPENDENT_AMBULATORY_CARE_PROVIDER_SITE_OTHER): Payer: BC Managed Care – PPO | Admitting: Physician Assistant

## 2017-01-06 DIAGNOSIS — E291 Testicular hypofunction: Secondary | ICD-10-CM

## 2017-01-08 ENCOUNTER — Other Ambulatory Visit: Payer: Self-pay

## 2017-01-08 ENCOUNTER — Encounter: Payer: Self-pay | Admitting: Family Medicine

## 2017-01-08 ENCOUNTER — Ambulatory Visit: Payer: BC Managed Care – PPO | Admitting: Family Medicine

## 2017-01-08 VITALS — BP 104/76 | HR 92 | Temp 98.4°F | Resp 17 | Ht 69.0 in | Wt 144.4 lb

## 2017-01-08 DIAGNOSIS — Z5181 Encounter for therapeutic drug level monitoring: Secondary | ICD-10-CM | POA: Diagnosis not present

## 2017-01-08 DIAGNOSIS — L853 Xerosis cutis: Secondary | ICD-10-CM

## 2017-01-08 DIAGNOSIS — E291 Testicular hypofunction: Secondary | ICD-10-CM | POA: Diagnosis not present

## 2017-01-08 DIAGNOSIS — L700 Acne vulgaris: Secondary | ICD-10-CM | POA: Diagnosis not present

## 2017-01-08 MED ORDER — HYDROCORTISONE 2.5 % EX OINT: TOPICAL_OINTMENT | Freq: Two times a day (BID) | CUTANEOUS | 3 refills | Status: DC

## 2017-01-08 MED ORDER — BENZOYL PEROXIDE 5 % EX LIQD: Freq: Two times a day (BID) | CUTANEOUS | 12 refills | Status: DC

## 2017-01-08 NOTE — Progress Notes (Signed)
Chief Complaint  Patient presents with  . Follow-up    6 month f/u from 07/03/16 visit, skin issues- itchiness and dryness intermittent x 6 mos.   Tried eucerin lotion to help with the dryness    HPI  Dry Skin Pt reports that he has been having dry skin patches on her legs that are itchy Seems to be all year round No hyperpigmentation Itches all day  Uses dove soap He showers daily with warm water  Hypogonadism He has been getting testosterone supplementation  He reports that he feels better with the testosterone and denies any side effects     Past Medical History:  Diagnosis Date  . Allergy   . GERD (gastroesophageal reflux disease)   . Seizures (HCC)   . Ulcer   . Ulcerative colitis (HCC)     Current Outpatient Medications  Medication Sig Dispense Refill  . alum hydroxide-mag trisilicate (GAVISCON) 80-20 MG CHEW chewable tablet Chew by mouth.    . Budesonide (UCERIS PO) Take by mouth.    . cetirizine (ZYRTEC) 10 MG tablet Take 1 tablet (10 mg total) by mouth daily. 30 tablet 11  . Cholecalciferol (VITAMIN D3) 1000 UNITS CAPS Take 1 capsule by mouth daily.    Marland Kitchen. esomeprazole (NEXIUM) 40 MG capsule Take 1 capsule (40 mg total) by mouth daily before breakfast. 30 capsule 12  . fluticasone (FLONASE) 50 MCG/ACT nasal spray Place 2 sprays into both nostrils daily. 16 g 6  . mesalamine (LIALDA) 1.2 G EC tablet Take 1,200 mg by mouth daily with breakfast.    . PAZEO 0.7 % SOLN Place 1 drop into both eyes daily as needed. 2.5 mL 0  . sildenafil (VIAGRA) 100 MG tablet Take 0.5-1 tablets (50-100 mg total) by mouth daily as needed for erectile dysfunction. 5 tablet 11  . testosterone cypionate (DEPOTESTOSTERONE CYPIONATE) 200 MG/ML injection INJECT 1 ML INTO THE MUSCLE EVERY 14 DAYS 6 mL 0  . benzoyl peroxide (BENZOYL PEROXIDE) 5 % external liquid Apply topically 2 (two) times daily. 142 g 12  . hydrocortisone 2.5 % ointment Apply topically 2 (two) times daily. 30 g 3   Current  Facility-Administered Medications  Medication Dose Route Frequency Provider Last Rate Last Dose  . testosterone cypionate (DEPOTESTOSTERONE CYPIONATE) injection 200 mg  200 mg Intramuscular Q14 Days Sherren MochaShaw, Eva N, MD   200 mg at 01/06/17 1413    Allergies:  Allergies  Allergen Reactions  . Amoxicillin Other (See Comments)    hematochezia  . Androgel [Testosterone]     Past Surgical History:  Procedure Laterality Date  . HERNIA REPAIR      Social History   Socioeconomic History  . Marital status: Married    Spouse name: None  . Number of children: None  . Years of education: None  . Highest education level: None  Social Needs  . Financial resource strain: None  . Food insecurity - worry: None  . Food insecurity - inability: None  . Transportation needs - medical: None  . Transportation needs - non-medical: None  Occupational History  . None  Tobacco Use  . Smoking status: Former Smoker    Last attempt to quit: 06/01/2003    Years since quitting: 13.6  . Smokeless tobacco: Never Used  Substance and Sexual Activity  . Alcohol use: No    Alcohol/week: 0.0 oz  . Drug use: No  . Sexual activity: None  Other Topics Concern  . None  Social History Narrative  . None  Family History  Problem Relation Age of Onset  . Cancer Mother   . Heart attack Mother   . Heart attack Father   . Lupus Sister   . Asthma Son      ROS Review of Systems See HPI Constitution: No fevers or chills No malaise No diaphoresis Skin: see hpi Eyes: no blurry vision, no double vision GU: no dysuria or hematuria Neuro: no dizziness or headaches all others reviewed and negative   Objective: Vitals:   01/08/17 1209  BP: 104/76  Pulse: 92  Resp: 17  Temp: 98.4 F (36.9 C)  TempSrc: Oral  SpO2: 98%  Weight: 144 lb 6.4 oz (65.5 kg)  Height: 5\' 9"  (1.753 m)    Physical Exam  Constitutional: He is oriented to person, place, and time. He appears well-developed and  well-nourished.  HENT:  Head: Normocephalic and atraumatic.  Eyes: Conjunctivae and EOM are normal.  Cardiovascular: Normal rate, regular rhythm and normal heart sounds.  No murmur heard. Pulmonary/Chest: Effort normal and breath sounds normal. No stridor. No respiratory distress.  Neurological: He is alert and oriented to person, place, and time.  Skin: dry skin on legs, acne on face  Assessment and Plan Donah Drivererence was seen today for follow-up.  Diagnoses and all orders for this visit:  Hypogonadism in male- continue T supplementation  Encounter for medication monitoring -     Comprehensive metabolic panel  Acne vulgaris -     benzoyl peroxide (BENZOYL PEROXIDE) 5 % external liquid; Apply topically 2 (two) times daily.  Dry skin dermatitis -     hydrocortisone 2.5 % ointment; Apply topically 2 (two) times daily.     Tenasia Aull A Aideen Fenster

## 2017-01-08 NOTE — Patient Instructions (Addendum)
   IF you received an x-ray today, you will receive an invoice from [facility]. Please contact [facility] at [phone] with questions or concerns regarding your invoice.   IF you received labwork today, you will receive an invoice from [facility]. Please contact [facility] at [phone] with questions or concerns regarding your invoice.   Our billing staff will not be able to assist you with questions regarding bills from these companies.  You will be contacted with the lab results as soon as they are available. The fastest way to get your results is to activate your My Chart account. Instructions are located on the last page of this paperwork. If you have not heard from us regarding the results in 2 weeks, please contact this office.    Eczema Eczema, also called atopic dermatitis, is a skin disorder that causes inflammation of the skin. It causes a red rash and dry, scaly skin. The skin becomes very itchy. Eczema is generally worse during the cooler winter months and often improves with the warmth of summer. Eczema usually starts showing signs in infancy. Some children outgrow eczema, but it may last through adulthood. What are the causes? The exact cause of eczema is not known, but it appears to run in families. People with eczema often have a family history of eczema, allergies, asthma, or hay fever. Eczema is not contagious. Flare-ups of the condition may be caused by:  Contact with something you are sensitive or allergic to.  Stress.  What are the signs or symptoms?  Dry, scaly skin.  Red, itchy rash.  Itchiness. This may occur before the skin rash and may be very intense. How is this diagnosed? The diagnosis of eczema is usually made based on symptoms and medical history. How is this treated? Eczema cannot be cured, but symptoms usually can be controlled with treatment and other strategies. A treatment plan might include:  Controlling the itching and  scratching. ? Use over-the-counter antihistamines as directed for itching. This is especially useful at night when the itching tends to be worse. ? Use over-the-counter steroid creams as directed for itching. ? Avoid scratching. Scratching makes the rash and itching worse. It may also result in a skin infection (impetigo) due to a break in the skin caused by scratching.  Keeping the skin well moisturized with creams every day. This will seal in moisture and help prevent dryness. Lotions that contain alcohol and water should be avoided because they can dry the skin.  Limiting exposure to things that you are sensitive or allergic to (allergens).  Recognizing situations that cause stress.  Developing a plan to manage stress.  Follow these instructions at home:  Only take over-the-counter or prescription medicines as directed by your health care provider.  Do not use anything on the skin without checking with your health care provider.  Keep baths or showers short (5 minutes) in warm (not hot) water. Use mild cleansers for bathing. These should be unscented. You may add nonperfumed bath oil to the bath water. It is best to avoid soap and bubble bath.  Immediately after a bath or shower, when the skin is still damp, apply a moisturizing ointment to the entire body. This ointment should be a petroleum ointment. This will seal in moisture and help prevent dryness. The thicker the ointment, the better. These should be unscented.  Keep fingernails cut short. Children with eczema may need to wear soft gloves or mittens at night after applying an ointment.  Dress in   clothes made of cotton or cotton blends. Dress lightly, because heat increases itching.  A child with eczema should stay away from anyone with fever blisters or cold sores. The virus that causes fever blisters (herpes simplex) can cause a serious skin infection in children with eczema. Contact a health care provider if:  Your itching  interferes with sleep.  Your rash gets worse or is not better within 1 week after starting treatment.  You see pus or soft yellow scabs in the rash area.  You have a fever.  You have a rash flare-up after contact with someone who has fever blisters. This information is not intended to replace advice given to you by your health care provider. Make sure you discuss any questions you have with your health care provider. Document Released: 01/26/2000 Document Revised: 07/06/2015 Document Reviewed: 08/31/2012 Elsevier Interactive Patient Education  2017 Elsevier Inc.  

## 2017-01-09 LAB — COMPREHENSIVE METABOLIC PANEL
ALT: 5 IU/L (ref 0–44)
AST: 11 IU/L (ref 0–40)
Albumin/Globulin Ratio: 1.3 (ref 1.2–2.2)
Albumin: 4.2 g/dL (ref 3.5–5.5)
Alkaline Phosphatase: 96 IU/L (ref 39–117)
BUN/Creatinine Ratio: 10 (ref 9–20)
BUN: 10 mg/dL (ref 6–24)
Bilirubin Total: 0.5 mg/dL (ref 0.0–1.2)
CO2: 27 mmol/L (ref 20–29)
Calcium: 9.3 mg/dL (ref 8.7–10.2)
Chloride: 100 mmol/L (ref 96–106)
Creatinine, Ser: 0.97 mg/dL (ref 0.76–1.27)
GFR calc Af Amer: 101 mL/min/{1.73_m2} (ref >59)
GFR calc non Af Amer: 88 mL/min/{1.73_m2} (ref >59)
Globulin, Total: 3.2 g/dL (ref 1.5–4.5)
Glucose: 79 mg/dL (ref 65–99)
Potassium: 5.1 mmol/L (ref 3.5–5.2)
Sodium: 139 mmol/L (ref 134–144)
Total Protein: 7.4 g/dL (ref 6.0–8.5)

## 2017-01-20 ENCOUNTER — Ambulatory Visit: Payer: BC Managed Care – PPO | Admitting: Physician Assistant

## 2017-02-03 ENCOUNTER — Other Ambulatory Visit: Payer: Self-pay

## 2017-02-03 ENCOUNTER — Encounter: Payer: Self-pay | Admitting: Family Medicine

## 2017-02-03 ENCOUNTER — Ambulatory Visit: Payer: BC Managed Care – PPO | Admitting: Family Medicine

## 2017-02-03 VITALS — BP 108/70 | HR 130 | Temp 98.0°F | Resp 16 | Ht 70.08 in | Wt 142.0 lb

## 2017-02-03 DIAGNOSIS — L309 Dermatitis, unspecified: Secondary | ICD-10-CM

## 2017-02-03 MED ORDER — KETOCONAZOLE 2 % EX FOAM: CUTANEOUS | 0 refills | Status: DC

## 2017-02-03 NOTE — Patient Instructions (Addendum)
  Try Caladryl   IF you received an x-ray today, you will receive an [facility] from [facility]. Please contact [facility] at [phone] with questions or concerns regarding your invoice.   IF you received labwork today, you will receive an invoice from [facility]. Please contact [facility] at [phone] with questions or concerns regarding your invoice.   Our billing staff will not be able to assist you with questions regarding bills from these companies.  You will be contacted with the lab results as soon as they are available. The fastest way to get your results is to activate your My Chart account. Instructions are located on the last page of this paperwork. If you have not heard from us regarding the results in 2 weeks, please contact this office.

## 2017-02-03 NOTE — Progress Notes (Signed)
Chief Complaint  Patient presents with  . Hypogonadism    follow-up     HPI  Pt reports that he gets itchy spots and notices that the hydrocortisone made his rash worse He is taking his zyrtec without much improvement  He scratches intermittently but cannot tell what is triggering it He does not have any pets No new soaps or meds It is localized to just his lower extremities   Past Medical History:  Diagnosis Date  . Allergy   . GERD (gastroesophageal reflux disease)   . Seizures ([place])   . Ulcer   . Ulcerative colitis ([place])     Current Outpatient Medications  Medication Sig Dispense Refill  . alum hydroxide-mag trisilicate (GAVISCON) 41-66 MG CHEW chewable tablet Chew by mouth.    . benzoyl peroxide (BENZOYL PEROXIDE) 5 % external liquid Apply topically 2 (two) times daily. 142 g 12  . Budesonide (UCERIS PO) Take by mouth.    . cetirizine (ZYRTEC) 10 MG tablet Take 1 tablet (10 mg total) by mouth daily. 30 tablet 11  . Cholecalciferol (VITAMIN D3) 1000 UNITS CAPS Take 1 capsule by mouth daily.    Marland Kitchen esomeprazole (NEXIUM) 40 MG capsule Take 1 capsule (40 mg total) by mouth daily before breakfast. 30 capsule 12  . fluticasone (FLONASE) 50 MCG/ACT nasal spray Place 2 sprays into both nostrils daily. 16 g 6  . hydrocortisone 2.5 % ointment Apply topically 2 (two) times daily. 30 g 3  . mesalamine (LIALDA) 1.2 G EC tablet Take 1,200 mg by mouth daily with breakfast.    . PAZEO 0.7 % SOLN Place 1 drop into both eyes daily as needed. 2.5 mL 0  . sildenafil (VIAGRA) 100 MG tablet Take 0.5-1 tablets (50-100 mg total) by mouth daily as needed for erectile dysfunction. 5 tablet 11  . testosterone cypionate (DEPOTESTOSTERONE CYPIONATE) 200 MG/ML injection INJECT 1 ML INTO THE MUSCLE EVERY 14 DAYS 6 mL 0  . Ketoconazole 2 % FOAM Apply topically to the affected areas twice a day for 2 weeks 100 g 0   Current Facility-Administered Medications  Medication Dose Route Frequency Provider Last  Rate Last Dose  . testosterone cypionate (DEPOTESTOSTERONE CYPIONATE) injection 200 mg  200 mg Intramuscular Q14 Days Shawnee Knapp, MD   200 mg at 01/06/17 1413    Allergies:  Allergies  Allergen Reactions  . Amoxicillin Other (See Comments)    hematochezia  . Androgel [Testosterone]     Past Surgical History:  Procedure Laterality Date  . HERNIA REPAIR      Social History   Socioeconomic History  . Marital status: Married    Spouse name: None  . Number of children: None  . Years of education: None  . Highest education level: None  Social Needs  . Financial resource strain: None  . Food insecurity - worry: None  . Food insecurity - inability: None  . Transportation needs - medical: None  . Transportation needs - non-medical: None  Occupational History  . None  Tobacco Use  . Smoking status: Former Smoker    Last attempt to quit: 06/01/2003    Years since quitting: 13.6  . Smokeless tobacco: Never Used  Substance and Sexual Activity  . Alcohol use: No    Alcohol/week: 0.0 oz  . Drug use: No  . Sexual activity: None  Other Topics Concern  . None  Social History Narrative  . None    Family History  Problem Relation Age of Onset  .  Cancer Mother   . Heart attack Mother   . Heart attack Father   . Lupus Sister   . Asthma Son      ROS Review of Systems See HPI Constitution: No fevers or chills No malaise No diaphoresis Skin: No rash or itching Eyes: no blurry vision, no double vision GU: no dysuria or hematuria Neuro: no dizziness or headaches all others reviewed and negative   Objective: Vitals:   02/03/17 1203  BP: 108/70  Pulse: (!) 130  Resp: 16  Temp: 98 F (36.7 C)  TempSrc: Oral  SpO2: 97%  Weight: 142 lb (64.4 kg)  Height: 5' 10.08" (1.78 m)    Physical Exam  Constitutional: He appears well-developed and well-nourished.  HENT:  Head: Normocephalic and atraumatic.  Eyes: Conjunctivae and EOM are normal.  Pulmonary/Chest: Effort  normal.  Skin: Skin is warm. Capillary refill takes less than 2 seconds.  Psychiatric: He has a normal mood and affect. His behavior is normal. Judgment and thought content normal.   Skin without excoriations Papules seen  No vesicles No bullae  Assessment and Plan Kadence was seen today for hypogonadism.  Diagnoses and all orders for this visit:  Dermatitis-  Since rash was worse with steroid will change to antifungal Follow up if no improvement -     Ketoconazole 2 % FOAM; Apply topically to the affected areas twice a day for 2 weeks     Zoe A Nolon Rod

## 2017-02-05 ENCOUNTER — Ambulatory Visit (INDEPENDENT_AMBULATORY_CARE_PROVIDER_SITE_OTHER): Payer: BC Managed Care – PPO | Admitting: Physician Assistant

## 2017-02-05 DIAGNOSIS — E291 Testicular hypofunction: Secondary | ICD-10-CM

## 2017-02-14 ENCOUNTER — Encounter: Payer: Self-pay | Admitting: Family Medicine

## 2017-02-17 ENCOUNTER — Ambulatory Visit (INDEPENDENT_AMBULATORY_CARE_PROVIDER_SITE_OTHER): Payer: BC Managed Care – PPO | Admitting: Physician Assistant

## 2017-02-17 DIAGNOSIS — E291 Testicular hypofunction: Secondary | ICD-10-CM | POA: Diagnosis not present

## 2017-02-17 NOTE — Addendum Note (Signed)
Addended by: Collie SiadSTALLINGS, Amiley Shishido A on: 02/17/2017 01:47 PM   Modules accepted: Orders

## 2017-03-03 ENCOUNTER — Ambulatory Visit (INDEPENDENT_AMBULATORY_CARE_PROVIDER_SITE_OTHER): Payer: BC Managed Care – PPO | Admitting: Physician Assistant

## 2017-03-03 DIAGNOSIS — E291 Testicular hypofunction: Secondary | ICD-10-CM

## 2017-03-17 ENCOUNTER — Ambulatory Visit (INDEPENDENT_AMBULATORY_CARE_PROVIDER_SITE_OTHER): Payer: BC Managed Care – PPO | Admitting: Family Medicine

## 2017-03-17 DIAGNOSIS — E291 Testicular hypofunction: Secondary | ICD-10-CM

## 2017-03-24 ENCOUNTER — Encounter: Payer: Self-pay | Admitting: Family Medicine

## 2017-03-24 NOTE — Progress Notes (Signed)
Pt here for injection

## 2017-03-31 ENCOUNTER — Other Ambulatory Visit: Payer: Self-pay | Admitting: Family Medicine

## 2017-03-31 ENCOUNTER — Ambulatory Visit (INDEPENDENT_AMBULATORY_CARE_PROVIDER_SITE_OTHER): Payer: BC Managed Care – PPO | Admitting: Physician Assistant

## 2017-03-31 DIAGNOSIS — E291 Testicular hypofunction: Secondary | ICD-10-CM

## 2017-03-31 NOTE — Progress Notes (Signed)
Patient presents for testosterone injection, patient brought in his own medication

## 2017-03-31 NOTE — Telephone Encounter (Signed)
Can I approved ?   Advise please

## 2017-04-01 ENCOUNTER — Encounter: Payer: Self-pay | Admitting: Family Medicine

## 2017-04-10 NOTE — Progress Notes (Signed)
Patient came in for injection

## 2017-04-14 ENCOUNTER — Ambulatory Visit (INDEPENDENT_AMBULATORY_CARE_PROVIDER_SITE_OTHER): Payer: BC Managed Care – PPO | Admitting: *Deleted

## 2017-04-14 DIAGNOSIS — E291 Testicular hypofunction: Secondary | ICD-10-CM | POA: Diagnosis not present

## 2017-04-14 MED ORDER — TESTOSTERONE CYPIONATE 200 MG/ML IM SOLN
100.0000 mg | Freq: Once | INTRAMUSCULAR | Status: AC
  Administered 2017-04-14: 100 mg via INTRAMUSCULAR

## 2017-04-14 NOTE — Patient Instructions (Signed)
Pt here for Testosterone injection only.

## 2017-04-19 ENCOUNTER — Telehealth: Payer: Self-pay | Admitting: *Deleted

## 2017-04-19 NOTE — Telephone Encounter (Signed)
Pt was assigned to you on 04/14/17 for Testosterone injection, chart will need to be close by the provider.

## 2017-04-28 ENCOUNTER — Ambulatory Visit (INDEPENDENT_AMBULATORY_CARE_PROVIDER_SITE_OTHER): Payer: BC Managed Care – PPO | Admitting: *Deleted

## 2017-04-28 DIAGNOSIS — E291 Testicular hypofunction: Secondary | ICD-10-CM

## 2017-04-28 MED ORDER — TESTOSTERONE CYPIONATE 200 MG/ML IM SOLN: 100.0000 mg | Freq: Once | INTRAMUSCULAR | Status: DC

## 2017-04-28 NOTE — Patient Instructions (Signed)
Pt here for Testosterone cypionate 200 mg/ml  injection only. Pt given 1 ml in left upper outer quadrant.

## 2017-05-12 ENCOUNTER — Ambulatory Visit (INDEPENDENT_AMBULATORY_CARE_PROVIDER_SITE_OTHER): Payer: BC Managed Care – PPO | Admitting: Family Medicine

## 2017-05-12 DIAGNOSIS — E291 Testicular hypofunction: Secondary | ICD-10-CM | POA: Diagnosis not present

## 2017-05-12 NOTE — Progress Notes (Signed)
Pt was seen for testosterone injection only.

## 2017-05-14 NOTE — Progress Notes (Signed)
Nurse visit only

## 2017-05-21 ENCOUNTER — Encounter: Payer: Self-pay | Admitting: Physician Assistant

## 2017-05-26 ENCOUNTER — Telehealth: Payer: Self-pay

## 2017-05-26 ENCOUNTER — Ambulatory Visit (INDEPENDENT_AMBULATORY_CARE_PROVIDER_SITE_OTHER): Payer: BC Managed Care – PPO | Admitting: *Deleted

## 2017-05-26 DIAGNOSIS — E291 Testicular hypofunction: Secondary | ICD-10-CM | POA: Diagnosis not present

## 2017-05-26 NOTE — Telephone Encounter (Signed)
Pt is requesting refill of testosterone

## 2017-05-26 NOTE — Patient Instructions (Addendum)
Pt here for Testosterone injection only.  Pt given testosterone cypionate 241m/ml, 1 ml in left upper outer quadrant.

## 2017-05-27 MED ORDER — TESTOSTERONE CYPIONATE 200 MG/ML IM SOLN: INTRAMUSCULAR | 1 refills | Status: DC

## 2017-05-27 NOTE — Addendum Note (Signed)
Addended by: Collie SiadSTALLINGS, Lyza Houseworth A on: 05/27/2017 04:14 PM   Modules accepted: Orders

## 2017-05-27 NOTE — Telephone Encounter (Signed)
Refill done.  

## 2017-05-27 NOTE — Telephone Encounter (Signed)
Please notify the patient.

## 2017-05-28 NOTE — Telephone Encounter (Signed)
Phone call to patient. Unable to reach. If patient calls back, please let him know [facility] [address].

## 2017-06-07 ENCOUNTER — Ambulatory Visit (INDEPENDENT_AMBULATORY_CARE_PROVIDER_SITE_OTHER): Payer: BC Managed Care – PPO

## 2017-06-07 DIAGNOSIS — E291 Testicular hypofunction: Secondary | ICD-10-CM | POA: Diagnosis not present

## 2017-06-07 NOTE — Progress Notes (Signed)
Administrations This Visit    testosterone cypionate (DEPOTESTOSTERONE CYPIONATE) injection 200 mg    Admin Date 06/07/2017 Action Given Dose 200 mg Route Intramuscular Administered By Oneida Alar, RN         Medication is patient-supplied.

## 2017-06-09 ENCOUNTER — Ambulatory Visit: Payer: BC Managed Care – PPO

## 2017-06-23 ENCOUNTER — Other Ambulatory Visit: Payer: Self-pay

## 2017-06-23 ENCOUNTER — Telehealth: Payer: Self-pay

## 2017-06-23 ENCOUNTER — Ambulatory Visit (INDEPENDENT_AMBULATORY_CARE_PROVIDER_SITE_OTHER): Payer: BC Managed Care – PPO | Admitting: Family Medicine

## 2017-06-23 DIAGNOSIS — E291 Testicular hypofunction: Secondary | ICD-10-CM

## 2017-06-23 NOTE — Telephone Encounter (Signed)
error 

## 2017-06-23 NOTE — Progress Notes (Signed)
Pt was here for testosterone injection.

## 2017-06-23 NOTE — Telephone Encounter (Signed)
Pt requesting refill of testosterone injection. LOV 02/03/2017.

## 2017-06-23 NOTE — Patient Instructions (Signed)
Please return in 2 weeks for your testosterone injection.

## 2017-06-24 NOTE — Telephone Encounter (Signed)
Please have patient come in for labs for his testosterone. He can get his refill and get back on his schedule after his next visit. Last levels were checked a year ago.

## 2017-06-25 ENCOUNTER — Telehealth: Payer: Self-pay

## 2017-06-25 DIAGNOSIS — Z5181 Encounter for therapeutic drug level monitoring: Secondary | ICD-10-CM

## 2017-06-25 DIAGNOSIS — E291 Testicular hypofunction: Secondary | ICD-10-CM

## 2017-07-04 ENCOUNTER — Ambulatory Visit (INDEPENDENT_AMBULATORY_CARE_PROVIDER_SITE_OTHER): Payer: BC Managed Care – PPO | Admitting: Physician Assistant

## 2017-07-04 DIAGNOSIS — E291 Testicular hypofunction: Secondary | ICD-10-CM

## 2017-07-04 NOTE — Patient Instructions (Signed)
Pt here for Testosterone cypionate 200/ml. Pt given 75m in right upper outer quadrant.

## 2017-07-04 NOTE — Telephone Encounter (Signed)
Pt notified via mychart

## 2017-07-04 NOTE — Telephone Encounter (Signed)
Please notify the patient to get his labs done fasting in the morning between 8-9am on a day that is convenient for him.

## 2017-07-04 NOTE — Progress Notes (Signed)
I did not see this patient.

## 2017-07-21 ENCOUNTER — Ambulatory Visit (INDEPENDENT_AMBULATORY_CARE_PROVIDER_SITE_OTHER): Payer: BC Managed Care – PPO | Admitting: Physician Assistant

## 2017-07-21 DIAGNOSIS — E291 Testicular hypofunction: Secondary | ICD-10-CM

## 2017-07-21 MED ORDER — TESTOSTERONE CYPIONATE 200 MG/ML IM SOLN
100.0000 mg | Freq: Once | INTRAMUSCULAR | Status: AC
  Administered 2017-07-21: 100 mg via INTRAMUSCULAR

## 2017-07-21 NOTE — Progress Notes (Signed)
Patient here for Testosterone Cypionate 200mg /ml injection. Pt was given 1ml in left upper outer quadrant.

## 2017-08-04 ENCOUNTER — Ambulatory Visit (INDEPENDENT_AMBULATORY_CARE_PROVIDER_SITE_OTHER): Payer: BC Managed Care – PPO | Admitting: Physician Assistant

## 2017-08-04 DIAGNOSIS — E291 Testicular hypofunction: Secondary | ICD-10-CM | POA: Diagnosis not present

## 2017-08-04 MED ORDER — TESTOSTERONE CYPIONATE 200 MG/ML IM SOLN
100.0000 mg | Freq: Once | INTRAMUSCULAR | Status: AC
  Administered 2017-08-04: 100 mg via INTRAMUSCULAR

## 2017-08-04 NOTE — Progress Notes (Signed)
I did not see this patient.

## 2017-08-04 NOTE — Patient Instructions (Signed)
Pt here for Testosterone Cypionate injection, 0.1 ml given in right upper outer quadrant.

## 2017-08-18 ENCOUNTER — Ambulatory Visit (INDEPENDENT_AMBULATORY_CARE_PROVIDER_SITE_OTHER): Payer: BC Managed Care – PPO | Admitting: Physician Assistant

## 2017-08-18 DIAGNOSIS — E291 Testicular hypofunction: Secondary | ICD-10-CM | POA: Diagnosis not present

## 2017-08-18 DIAGNOSIS — Z5181 Encounter for therapeutic drug level monitoring: Secondary | ICD-10-CM

## 2017-08-18 NOTE — Progress Notes (Signed)
Pt here for Testosterone Cypionate injection, 0.1 ml given in left upper outer quadrant.

## 2017-08-21 LAB — CBC WITH DIFFERENTIAL/PLATELET
Basophils Absolute: 0 10*3/uL (ref 0.0–0.2)
Basos: 1 %
EOS (ABSOLUTE): 0.1 10*3/uL (ref 0.0–0.4)
Eos: 1 %
Hematocrit: 45.4 % (ref 37.5–51.0)
Hemoglobin: 14.6 g/dL (ref 13.0–17.7)
Immature Grans (Abs): 0 10*3/uL (ref 0.0–0.1)
Immature Granulocytes: 0 %
Lymphocytes Absolute: 3 10*3/uL (ref 0.7–3.1)
Lymphs: 39 %
MCH: 28.6 pg (ref 26.6–33.0)
MCHC: 32.2 g/dL (ref 31.5–35.7)
MCV: 89 fL (ref 79–97)
Monocytes Absolute: 0.7 10*3/uL (ref 0.1–0.9)
Monocytes: 10 %
Neutrophils Absolute: 3.7 10*3/uL (ref 1.4–7.0)
Neutrophils: 49 %
Platelets: 402 10*3/uL (ref 150–450)
RBC: 5.11 x10E6/uL (ref 4.14–5.80)
RDW: 16.2 % — ABNORMAL HIGH (ref 12.3–15.4)
WBC: 7.6 10*3/uL (ref 3.4–10.8)

## 2017-08-21 LAB — LIPID PANEL
Chol/HDL Ratio: 1.6 ratio (ref 0.0–5.0)
Cholesterol, Total: 148 mg/dL (ref 100–199)
HDL: 94 mg/dL (ref >39)
LDL Calculated: 44 mg/dL (ref 0–99)
Triglycerides: 49 mg/dL (ref 0–149)
VLDL Cholesterol Cal: 10 mg/dL (ref 5–40)

## 2017-08-21 LAB — COMPREHENSIVE METABOLIC PANEL
ALT: 9 IU/L (ref 0–44)
AST: 21 IU/L (ref 0–40)
Albumin/Globulin Ratio: 1.2 (ref 1.2–2.2)
Albumin: 4.1 g/dL (ref 3.5–5.5)
Alkaline Phosphatase: 93 IU/L (ref 39–117)
BUN/Creatinine Ratio: 11 (ref 9–20)
BUN: 13 mg/dL (ref 6–24)
Bilirubin Total: 0.4 mg/dL (ref 0.0–1.2)
CO2: 22 mmol/L (ref 20–29)
Calcium: 9.4 mg/dL (ref 8.7–10.2)
Chloride: 101 mmol/L (ref 96–106)
Creatinine, Ser: 1.16 mg/dL (ref 0.76–1.27)
GFR calc Af Amer: 81 mL/min/{1.73_m2} (ref >59)
GFR calc non Af Amer: 70 mL/min/{1.73_m2} (ref >59)
Globulin, Total: 3.3 g/dL (ref 1.5–4.5)
Glucose: 133 mg/dL — ABNORMAL HIGH (ref 65–99)
Potassium: 5.5 mmol/L — ABNORMAL HIGH (ref 3.5–5.2)
Sodium: 142 mmol/L (ref 134–144)
Total Protein: 7.4 g/dL (ref 6.0–8.5)

## 2017-08-21 LAB — TESTT+TESTF+SHBG
Sex Hormone Binding: 44.8 nmol/L (ref 19.3–76.4)
Testosterone, Free: 10.4 pg/mL (ref 7.2–24.0)
Testosterone, total: 427.7 ng/dL (ref 264.0–916.0)

## 2017-09-01 ENCOUNTER — Ambulatory Visit (INDEPENDENT_AMBULATORY_CARE_PROVIDER_SITE_OTHER): Payer: BC Managed Care – PPO | Admitting: Physician Assistant

## 2017-09-01 DIAGNOSIS — E291 Testicular hypofunction: Secondary | ICD-10-CM

## 2017-09-01 NOTE — Progress Notes (Signed)
Lab only visit for testosterone injection.Patient received injection in his RUOQ.  Patient was dismissed without any immediate side-effects from the medication.

## 2017-09-15 ENCOUNTER — Ambulatory Visit (INDEPENDENT_AMBULATORY_CARE_PROVIDER_SITE_OTHER): Payer: BC Managed Care – PPO | Admitting: Family Medicine

## 2017-09-15 ENCOUNTER — Other Ambulatory Visit: Payer: Self-pay | Admitting: *Deleted

## 2017-09-15 DIAGNOSIS — E291 Testicular hypofunction: Secondary | ICD-10-CM

## 2017-09-15 MED ORDER — TESTOSTERONE CYPIONATE 200 MG/ML IM SOLN: INTRAMUSCULAR | 1 refills | Status: DC

## 2017-09-15 NOTE — Progress Notes (Signed)
Testosterone injection only.  Patient received injection in his LUOQ and he tolerated the injection well.  Patient requested a refill on his testosterone medication.  He was advised that it will be sent in today.   Patient verbalized understanding.

## 2017-09-29 ENCOUNTER — Ambulatory Visit (INDEPENDENT_AMBULATORY_CARE_PROVIDER_SITE_OTHER): Payer: BC Managed Care – PPO | Admitting: Family Medicine

## 2017-09-29 DIAGNOSIS — E291 Testicular hypofunction: Secondary | ICD-10-CM

## 2017-09-29 NOTE — Progress Notes (Signed)
Testosterone injection only.  Patient received injection in his RUOQ and he tolerated the injection well. 200mg /ml  Patient verbalized understanding.   Pt just pick up refills today and has 6 more at the pharmacy. I have called pt back and informed him of this. He stated understanding.   Sheilah MinsMei Hua  Lisbeth Renshawhan Velasco

## 2017-10-14 ENCOUNTER — Ambulatory Visit (INDEPENDENT_AMBULATORY_CARE_PROVIDER_SITE_OTHER): Payer: BC Managed Care – PPO | Admitting: Family Medicine

## 2017-10-14 DIAGNOSIS — E291 Testicular hypofunction: Secondary | ICD-10-CM

## 2017-10-14 NOTE — Patient Instructions (Signed)
° ° ° °  If you have lab work done today you will be contacted with your lab results within the next 2 weeks.  If you have not heard from us then please contact us. The fastest way to get your results is to register for My Chart. ° ° °IF you received an x-ray today, you will receive an invoice from [facility]. Please contact [facility] at [phone] with questions or concerns regarding your invoice.  ° °IF you received labwork today, you will receive an invoice from [facility]. Please contact [facility] at [phone] with questions or concerns regarding your invoice.  ° °Our billing staff will not be able to assist you with questions regarding bills from these companies. ° °You will be contacted with the lab results as soon as they are available. The fastest way to get your results is to activate your My Chart account. Instructions are located on the last page of this paperwork. If you have not heard from us regarding the results in 2 weeks, please contact this office. °  ° ° ° °

## 2017-10-27 ENCOUNTER — Ambulatory Visit (INDEPENDENT_AMBULATORY_CARE_PROVIDER_SITE_OTHER): Payer: BC Managed Care – PPO | Admitting: Family Medicine

## 2017-10-27 DIAGNOSIS — E291 Testicular hypofunction: Secondary | ICD-10-CM | POA: Diagnosis not present

## 2017-10-27 NOTE — Patient Instructions (Signed)
Injection went well

## 2017-11-10 ENCOUNTER — Ambulatory Visit (INDEPENDENT_AMBULATORY_CARE_PROVIDER_SITE_OTHER): Payer: BC Managed Care – PPO | Admitting: Family Medicine

## 2017-11-10 DIAGNOSIS — E291 Testicular hypofunction: Secondary | ICD-10-CM | POA: Diagnosis not present

## 2017-11-10 NOTE — Progress Notes (Signed)
Testosterone injection only.  Patient received injection in his RUOQ and he tolerated the injection well. 200mg /ml  ZOX:[id number]:[id number] Exp:06/2019

## 2017-11-24 ENCOUNTER — Ambulatory Visit (INDEPENDENT_AMBULATORY_CARE_PROVIDER_SITE_OTHER): Payer: BC Managed Care – PPO | Admitting: Family Medicine

## 2017-11-24 DIAGNOSIS — E291 Testicular hypofunction: Secondary | ICD-10-CM | POA: Diagnosis not present

## 2017-12-08 ENCOUNTER — Ambulatory Visit (INDEPENDENT_AMBULATORY_CARE_PROVIDER_SITE_OTHER): Payer: BC Managed Care – PPO | Admitting: Family Medicine

## 2017-12-08 DIAGNOSIS — E291 Testicular hypofunction: Secondary | ICD-10-CM | POA: Diagnosis not present

## 2017-12-11 ENCOUNTER — Encounter: Payer: Self-pay | Admitting: Family Medicine

## 2017-12-12 ENCOUNTER — Other Ambulatory Visit: Payer: Self-pay | Admitting: Family Medicine

## 2017-12-12 DIAGNOSIS — E291 Testicular hypofunction: Secondary | ICD-10-CM

## 2017-12-12 MED ORDER — TESTOSTERONE CYPIONATE 200 MG/ML IM SOLN: INTRAMUSCULAR | 1 refills | Status: DC

## 2017-12-12 MED ORDER — SILDENAFIL CITRATE 100 MG PO TABS: 50.0000 mg | ORAL_TABLET | Freq: Every day | ORAL | 11 refills | Status: DC | PRN

## 2017-12-22 ENCOUNTER — Ambulatory Visit: Payer: BC Managed Care – PPO

## 2017-12-22 ENCOUNTER — Ambulatory Visit (INDEPENDENT_AMBULATORY_CARE_PROVIDER_SITE_OTHER): Payer: BC Managed Care – PPO | Admitting: Family Medicine

## 2017-12-22 DIAGNOSIS — E291 Testicular hypofunction: Secondary | ICD-10-CM | POA: Diagnosis not present

## 2018-01-05 ENCOUNTER — Ambulatory Visit (INDEPENDENT_AMBULATORY_CARE_PROVIDER_SITE_OTHER): Payer: BC Managed Care – PPO | Admitting: Family Medicine

## 2018-01-05 DIAGNOSIS — E291 Testicular hypofunction: Secondary | ICD-10-CM

## 2018-01-05 MED ORDER — TESTOSTERONE CYPIONATE 200 MG/ML IM SOLN
100.0000 mg | Freq: Once | INTRAMUSCULAR | Status: AC
  Administered 2018-01-05: 100 mg via INTRAMUSCULAR

## 2018-01-05 NOTE — Patient Instructions (Signed)
Pt here for Testosterone Cypionate 200mg /ml given 1cc in the right upper outer quadrant.

## 2018-01-19 ENCOUNTER — Ambulatory Visit (INDEPENDENT_AMBULATORY_CARE_PROVIDER_SITE_OTHER): Payer: BC Managed Care – PPO | Admitting: Family Medicine

## 2018-01-19 DIAGNOSIS — E291 Testicular hypofunction: Secondary | ICD-10-CM | POA: Diagnosis not present

## 2018-01-19 MED ORDER — TESTOSTERONE CYPIONATE 200 MG/ML IM SOLN: 100.0000 mg | Freq: Once | INTRAMUSCULAR | Status: DC

## 2018-01-19 NOTE — Patient Instructions (Addendum)
Pt here for testosterone cypionate 200 mg/ml. Pt given 1 ml in left upper outer quadrant. Per instructions on box when pt come in was to administer 1 ml and that is the dosage that the patient received.

## 2018-02-02 ENCOUNTER — Ambulatory Visit (INDEPENDENT_AMBULATORY_CARE_PROVIDER_SITE_OTHER): Payer: BC Managed Care – PPO | Admitting: Family Medicine

## 2018-02-02 DIAGNOSIS — R972 Elevated prostate specific antigen [PSA]: Secondary | ICD-10-CM

## 2018-02-02 DIAGNOSIS — E291 Testicular hypofunction: Secondary | ICD-10-CM | POA: Diagnosis not present

## 2018-02-02 NOTE — Progress Notes (Signed)
Patient was seen for testosterone shot only

## 2018-02-16 ENCOUNTER — Ambulatory Visit (INDEPENDENT_AMBULATORY_CARE_PROVIDER_SITE_OTHER): Payer: BC Managed Care – PPO | Admitting: Family Medicine

## 2018-02-16 DIAGNOSIS — E291 Testicular hypofunction: Secondary | ICD-10-CM

## 2018-02-16 NOTE — Patient Instructions (Signed)
Pt present in the office today for injection.

## 2018-02-18 ENCOUNTER — Telehealth: Payer: Self-pay | Admitting: Family Medicine

## 2018-02-18 NOTE — Telephone Encounter (Signed)
Attempted to call pt back regarding scheduling of a CPE with Dr. Nolon Rod. There is no VM picked up and no DPR signed in order to leave a message. I will be sending a MyChart message to pt. When pt calls back, please advise that there are no appointments available until (whatever date her first available is at the time of his phone call). Advise him that we can put in a message to Dr. Nolon Rod if he would like but we can't make any promises. We will have to go based on what she says. If this is acceptable to him, please send a message to [place] so we may send to Dr. Nolon Rod. Thank you!

## 2018-03-02 ENCOUNTER — Ambulatory Visit (INDEPENDENT_AMBULATORY_CARE_PROVIDER_SITE_OTHER): Payer: BC Managed Care – PPO | Admitting: Family Medicine

## 2018-03-02 ENCOUNTER — Telehealth: Payer: Self-pay | Admitting: Emergency Medicine

## 2018-03-02 DIAGNOSIS — E291 Testicular hypofunction: Secondary | ICD-10-CM

## 2018-03-02 NOTE — Patient Instructions (Signed)
Given Right upper Quad

## 2018-03-02 NOTE — Telephone Encounter (Signed)
patient was seen in office today and wanted to let you (Dr Creta Levin)  know that he could not get into the office for an appt untill March, He was given his last dose today and he will need a refill on his testosterone until he can come in for his appt. He also wanted to know if you needed him to come in sooner then March

## 2018-03-03 MED ORDER — TESTOSTERONE CYPIONATE 200 MG/ML IM SOLN: INTRAMUSCULAR | 1 refills | Status: DC

## 2018-03-03 NOTE — Telephone Encounter (Signed)
Refill sent in

## 2018-03-03 NOTE — Telephone Encounter (Signed)
Pt informed

## 2018-03-05 ENCOUNTER — Encounter: Payer: Self-pay | Admitting: Family Medicine

## 2018-03-16 ENCOUNTER — Ambulatory Visit: Payer: BC Managed Care – PPO

## 2018-03-17 ENCOUNTER — Encounter: Payer: Self-pay | Admitting: Family Medicine

## 2018-03-17 ENCOUNTER — Ambulatory Visit (INDEPENDENT_AMBULATORY_CARE_PROVIDER_SITE_OTHER): Payer: BC Managed Care – PPO | Admitting: Family Medicine

## 2018-03-17 ENCOUNTER — Other Ambulatory Visit: Payer: Self-pay

## 2018-03-17 VITALS — BP 130/89 | HR 116 | Temp 99.8°F | Resp 17 | Ht 70.8 in | Wt 155.4 lb

## 2018-03-17 DIAGNOSIS — R6889 Other general symptoms and signs: Secondary | ICD-10-CM | POA: Diagnosis not present

## 2018-03-17 DIAGNOSIS — E291 Testicular hypofunction: Secondary | ICD-10-CM | POA: Diagnosis not present

## 2018-03-17 DIAGNOSIS — J0100 Acute maxillary sinusitis, unspecified: Secondary | ICD-10-CM | POA: Diagnosis not present

## 2018-03-17 DIAGNOSIS — J101 Influenza due to other identified influenza virus with other respiratory manifestations: Secondary | ICD-10-CM

## 2018-03-17 LAB — POC INFLUENZA A&B (BINAX/QUICKVUE)
Influenza A, POC: POSITIVE — AB
Influenza B, POC: NEGATIVE

## 2018-03-17 MED ORDER — AZITHROMYCIN 250 MG PO TABS: ORAL_TABLET | ORAL | 1 refills | Status: DC

## 2018-03-17 NOTE — Patient Instructions (Addendum)
   If you have lab work done today you will be contacted with your lab results within the next 2 weeks.  If you have not heard from us then please contact us. The fastest way to get your results is to register for My Chart.   IF you received an x-ray today, you will receive an invoice from [facility]. Please contact [facility] at [phone] with questions or concerns regarding your invoice.   IF you received labwork today, you will receive an invoice from [facility]. Please contact [facility] at [phone] with questions or concerns regarding your invoice.   Our billing staff will not be able to assist you with questions regarding bills from these companies.  You will be contacted with the lab results as soon as they are available. The fastest way to get your results is to activate your My Chart account. Instructions are located on the last page of this paperwork. If you have not heard from us regarding the results in 2 weeks, please contact this office.      Sinusitis, Adult Sinusitis is inflammation of your sinuses. Sinuses are hollow spaces in the bones around your face. Your sinuses are located:  Around your eyes.  In the middle of your forehead.  Behind your nose.  In your cheekbones. Mucus normally drains out of your sinuses. When your nasal tissues become inflamed or swollen, mucus can become trapped or blocked. This allows bacteria, viruses, and fungi to grow, which leads to infection. Most infections of the sinuses are caused by a virus. Sinusitis can develop quickly. It can last for up to 4 weeks (acute) or for more than 12 weeks (chronic). Sinusitis often develops after a cold. What are the causes? This condition is caused by anything that creates swelling in the sinuses or stops mucus from draining. This includes:  Allergies.  Asthma.  Infection from bacteria or viruses.  Deformities or blockages in your nose or sinuses.  Abnormal growths in  the nose (nasal polyps).  Pollutants, such as chemicals or irritants in the air.  Infection from fungi (rare). What increases the risk? You are more likely to develop this condition if you:  Have a weak body defense system (immune system).  Do a lot of swimming or diving.  Overuse nasal sprays.  Smoke. What are the signs or symptoms? The main symptoms of this condition are pain and a feeling of pressure around the affected sinuses. Other symptoms include:  Stuffy nose or congestion.  Thick drainage from your nose.  Swelling and warmth over the affected sinuses.  Headache.  Upper toothache.  A cough that may get worse at night.  Extra mucus that collects in the throat or the back of the nose (postnasal drip).  Decreased sense of smell and taste.  Fatigue.  A fever.  Sore throat.  Bad breath. How is this diagnosed? This condition is diagnosed based on:  Your symptoms.  Your medical history.  A physical exam.  Tests to find out if your condition is acute or chronic. This may include: ? Checking your nose for nasal polyps. ? Viewing your sinuses using a device that has a light (endoscope). ? Testing for allergies or bacteria. ? Imaging tests, such as an MRI or CT scan. In rare cases, a bone biopsy may be done to rule out more serious types of fungal sinus disease. How is this treated? Treatment for sinusitis depends on the cause and whether your condition is chronic or acute.  If   caused by a virus, your symptoms should go away on their own within 10 days. You may be given medicines to relieve symptoms. They include: ? Medicines that shrink swollen nasal passages (topical intranasal decongestants). ? Medicines that treat allergies (antihistamines). ? A spray that eases inflammation of the nostrils (topical intranasal corticosteroids). ? Rinses that help get rid of thick mucus in your nose (nasal saline washes).  If caused by bacteria, your health care  provider may recommend waiting to see if your symptoms improve. Most bacterial infections will get better without antibiotic medicine. You may be given antibiotics if you have: ? A severe infection. ? A weak immune system.  If caused by narrow nasal passages or nasal polyps, you may need to have surgery. Follow these instructions at home: Medicines  Take, use, or apply over-the-counter and prescription medicines only as told by your health care provider. These may include nasal sprays.  If you were prescribed an antibiotic medicine, take it as told by your health care provider. Do not stop taking the antibiotic even if you start to feel better. Hydrate and humidify   Drink enough fluid to keep your urine pale yellow. Staying hydrated will help to thin your mucus.  Use a cool mist humidifier to keep the humidity level in your home above 50%.  Inhale steam for 10-15 minutes, 3-4 times a day, or as told by your health care provider. You can do this in the bathroom while a hot shower is running.  Limit your exposure to cool or dry air. Rest  Rest as much as possible.  Sleep with your head raised (elevated).  Make sure you get enough sleep each night. General instructions   Apply a warm, moist washcloth to your face 3-4 times a day or as told by your health care provider. This will help with discomfort.  Wash your hands often with soap and water to reduce your exposure to germs. If soap and water are not available, use hand sanitizer.  Do not smoke. Avoid being around people who are smoking (secondhand smoke).  Keep all follow-up visits as told by your health care provider. This is important. Contact a health care provider if:  You have a fever.  Your symptoms get worse.  Your symptoms do not improve within 10 days. Get help right away if:  You have a severe headache.  You have persistent vomiting.  You have severe pain or swelling around your face or eyes.  You have  vision problems.  You develop confusion.  Your neck is stiff.  You have trouble breathing. Summary  Sinusitis is soreness and inflammation of your sinuses. Sinuses are hollow spaces in the bones around your face.  This condition is caused by nasal tissues that become inflamed or swollen. The swelling traps or blocks the flow of mucus. This allows bacteria, viruses, and fungi to grow, which leads to infection.  If you were prescribed an antibiotic medicine, take it as told by your health care provider. Do not stop taking the antibiotic even if you start to feel better.  Keep all follow-up visits as told by your health care provider. This is important. This information is not intended to replace advice given to you by your health care provider. Make sure you discuss any questions you have with your health care provider. Document Released: 01/28/2005 Document Revised: 06/30/2017 Document Reviewed: 06/30/2017 Elsevier Interactive Patient Education  2019 [employer]  

## 2018-03-17 NOTE — Progress Notes (Signed)
Established Patient Office Visit  Subjective:  Patient ID: Marcus Gutierrez, male    DOB: 1960/12/30  Age: 58 y.o. MRN: [id number]  CC:  Chief Complaint  Patient presents with  . Influenza    possible flu, ha, fever, bodyaches, chills and sweat, onset: 1/25 and green mucus drainage and pain/pressure in face.  Taking tylenol and ibuprofen for symptoms but only helps temporarily    HPI Marcus Gutierrez presents for   Onset March 07, 2018  Sore throat and headache States that the headache become more severe 7 days ago States that his daughter got into a car accident so he did not get attention States that he could not work last week He had fatigue at work after 3 hours and had to leave work to go lay down  He has headache, fever, bodyaches, chills, sweats He also has mucus drainage that is greens with pain and pressure in his face He states that he is taking tylenol with ibuprofen for symptoms  Sick contacts: his son who had a viral URI  His last dose of tylenol was this morning around 5:30am    Past Medical History:  Diagnosis Date  . Allergy   . GERD (gastroesophageal reflux disease)   . Seizures (HCC)   . Ulcer   . Ulcerative colitis [facility])     Past Surgical History:  Procedure Laterality Date  . HERNIA REPAIR      Family History  Problem Relation Age of Onset  . Cancer Mother   . Heart attack Mother   . Heart attack Father   . Lupus Sister   . Asthma Son     Social History   Socioeconomic History  . Marital status: Married    Spouse name: Not on file  . Number of children: Not on file  . Years of education: Not on file  . Highest education level: Not on file  Occupational History  . Not on file  Social Needs  . Financial resource strain: Not on file  . Food insecurity:    Worry: Not on file    Inability: Not on file  . Transportation needs:    Medical: Not on file    Non-medical: Not on file  Tobacco Use  . Smoking status: Former Smoker   Last attempt to quit: 06/01/2003    Years since quitting: 14.8  . Smokeless tobacco: Never Used  Substance and Sexual Activity  . Alcohol use: No    Alcohol/week: 0.0 standard drinks  . Drug use: No  . Sexual activity: Not on file  Lifestyle  . Physical activity:    Days per week: Not on file    Minutes per session: Not on file  . Stress: Not on file  Relationships  . Social connections:    Talks on phone: Not on file    Gets together: Not on file    Attends religious service: Not on file    Active member of club or organization: Not on file    Attends meetings of clubs or organizations: Not on file    Relationship status: Not on file  . Intimate partner violence:    Fear of current or ex partner: Not on file    Emotionally abused: Not on file    Physically abused: Not on file    Forced sexual activity: Not on file  Other Topics Concern  . Not on file  Social History Narrative  . Not on file    Outpatient  Medications Prior to Visit  Medication Sig Dispense Refill  . alum hydroxide-mag trisilicate (GAVISCON) 80-20 MG CHEW chewable tablet Chew by mouth.    . benzoyl peroxide (BENZOYL PEROXIDE) 5 % external liquid Apply topically 2 (two) times daily. 142 g 12  . Budesonide (UCERIS PO) Take by mouth.    . cetirizine (ZYRTEC) 10 MG tablet Take 1 tablet (10 mg total) by mouth daily. 30 tablet 11  . Cholecalciferol (VITAMIN D3) 1000 UNITS CAPS Take 1 capsule by mouth daily.    Marland Kitchen. esomeprazole (NEXIUM) 40 MG capsule Take 1 capsule (40 mg total) by mouth daily before breakfast. 30 capsule 12  . fluticasone (FLONASE) 50 MCG/ACT nasal spray Place 2 sprays into both nostrils daily. 16 g 6  . hydrocortisone 2.5 % ointment Apply topically 2 (two) times daily. 30 g 3  . Ketoconazole 2 % FOAM Apply topically to the affected areas twice a day for 2 weeks 100 g 0  . mesalamine (LIALDA) 1.2 G EC tablet Take 1,200 mg by mouth daily with breakfast.    . PAZEO 0.7 % SOLN Place 1 drop into both  eyes daily as needed. 2.5 mL 0  . sildenafil (VIAGRA) 100 MG tablet Take 0.5-1 tablets (50-100 mg total) by mouth daily as needed for erectile dysfunction. 5 tablet 11  . testosterone cypionate (DEPOTESTOSTERONE CYPIONATE) 200 MG/ML injection Inject 1 ml into the muscle every 14 days 6 mL 1   Facility-Administered Medications Prior to Visit  Medication Dose Route Frequency Provider Last Rate Last Dose  . testosterone cypionate (DEPOTESTOSTERONE CYPIONATE) injection 100 mg  100 mg Intramuscular Once Doristine BosworthStallings, Kriston Pasquarello A, MD        Allergies  Allergen Reactions  . Amoxicillin Other (See Comments)    hematochezia  . Androgel [Testosterone]     ROS Review of Systems    Objective:    Physical Exam  BP 130/89 (BP Location: Right Arm, Patient Position: Sitting, Cuff Size: Normal)   Pulse (!) 116   Temp 99.8 F (37.7 C) (Oral)   Resp 17   Ht 5' 10.8" (1.798 m)   Wt 155 lb 6.4 oz (70.5 kg)   SpO2 98%   BMI 21.80 kg/m  Wt Readings from Last 3 Encounters:  03/17/18 155 lb 6.4 oz (70.5 kg)  02/03/17 142 lb (64.4 kg)  01/08/17 144 lb 6.4 oz (65.5 kg)   General: alert, oriented, in NAD Head: normocephalic, atraumatic, + frontal and maxillary sinus tenderness Eyes: EOM intact, no scleral icterus or conjunctival injection Ears: TM clear bilaterally Nose: mucosa +erythematous, +edematous Throat: no pharyngeal exudate or erythema Lymph: no posterior auricular, submental or cervical lymph adenopathy Heart: normal rate, normal sinus rhythm, no murmurs Lungs: clear to auscultation bilaterally, no wheezing    Health Maintenance Due  Topic Date Due  . TETANUS/TDAP  12/11/2016  . INFLUENZA VACCINE  09/11/2017    There are no preventive care reminders to display for this patient.  Lab Results  Component Value Date   TSH 0.738 02/13/2015   Lab Results  Component Value Date   WBC 7.6 08/18/2017   HGB 14.6 08/18/2017   HCT 45.4 08/18/2017   MCV 89 08/18/2017   PLT 402 08/18/2017     Lab Results  Component Value Date   NA 142 08/18/2017   K 5.5 (H) 08/18/2017   CO2 22 08/18/2017   GLUCOSE 133 (H) 08/18/2017   BUN 13 08/18/2017   CREATININE 1.16 08/18/2017   BILITOT 0.4 08/18/2017   ALKPHOS  93 08/18/2017   AST 21 08/18/2017   ALT 9 08/18/2017   PROT 7.4 08/18/2017   ALBUMIN 4.1 08/18/2017   CALCIUM 9.4 08/18/2017   Lab Results  Component Value Date   CHOL 148 08/18/2017   Lab Results  Component Value Date   HDL 94 08/18/2017   Lab Results  Component Value Date   LDLCALC 44 08/18/2017   Lab Results  Component Value Date   TRIG 49 08/18/2017   Lab Results  Component Value Date   CHOLHDL 1.6 08/18/2017   No results found for: HGBA1C    Assessment & Plan:   Problem List Items Addressed This Visit    None    Visit Diagnoses    Acute non-recurrent maxillary sinusitis    -  Primary Discussed superimposed bacterial infection Discussed that he should stay home and rest for the next week Since he is PCN allergic gave 2 zpaks Discussed continuing acetaminophen     Relevant Medications   azithromycin (ZITHROMAX) 250 MG tablet   Flu-like symptoms       Relevant Orders   POC Influenza A&B(BINAX/QUICKVUE) (Completed)   Influenza A    -  Discussed that he should take the zithromax    Relevant Medications   azithromycin (ZITHROMAX) 250 MG tablet      Meds ordered this encounter  Medications  . azithromycin (ZITHROMAX) 250 MG tablet    Sig: Take 2 tablets on day one then one tablet each day after    Dispense:  6 tablet    Refill:  1    Follow-up: Return in about 4 weeks (around 04/14/2018) for physical exam - please use same day .    Doristine BosworthZoe A Prabhjot Maddux, MD

## 2018-03-23 ENCOUNTER — Ambulatory Visit (INDEPENDENT_AMBULATORY_CARE_PROVIDER_SITE_OTHER): Payer: BC Managed Care – PPO | Admitting: Family Medicine

## 2018-03-23 DIAGNOSIS — E291 Testicular hypofunction: Secondary | ICD-10-CM

## 2018-03-23 MED ORDER — TESTOSTERONE CYPIONATE 200 MG/ML IM SOLN
200.0000 mg | INTRAMUSCULAR | Status: DC
  Administered 2018-03-23 – 2019-11-01 (×40): 200 mg via INTRAMUSCULAR

## 2018-03-23 NOTE — Patient Instructions (Signed)
Pt tolerated injection well ( RUQ)

## 2018-04-06 ENCOUNTER — Ambulatory Visit (INDEPENDENT_AMBULATORY_CARE_PROVIDER_SITE_OTHER): Payer: BC Managed Care – PPO | Admitting: Family Medicine

## 2018-04-06 DIAGNOSIS — E291 Testicular hypofunction: Secondary | ICD-10-CM | POA: Diagnosis not present

## 2018-04-06 NOTE — Patient Instructions (Signed)
Pt tolerated injection well

## 2018-04-14 ENCOUNTER — Other Ambulatory Visit: Payer: Self-pay

## 2018-04-14 ENCOUNTER — Ambulatory Visit (INDEPENDENT_AMBULATORY_CARE_PROVIDER_SITE_OTHER): Payer: BC Managed Care – PPO | Admitting: Family Medicine

## 2018-04-14 ENCOUNTER — Encounter: Payer: Self-pay | Admitting: Family Medicine

## 2018-04-14 VITALS — BP 110/71 | HR 103 | Temp 98.0°F | Resp 16 | Ht 68.5 in | Wt 156.0 lb

## 2018-04-14 DIAGNOSIS — Z5181 Encounter for therapeutic drug level monitoring: Secondary | ICD-10-CM

## 2018-04-14 DIAGNOSIS — Z0001 Encounter for general adult medical examination with abnormal findings: Secondary | ICD-10-CM

## 2018-04-14 DIAGNOSIS — E291 Testicular hypofunction: Secondary | ICD-10-CM

## 2018-04-14 DIAGNOSIS — Z125 Encounter for screening for malignant neoplasm of prostate: Secondary | ICD-10-CM

## 2018-04-14 DIAGNOSIS — Z13220 Encounter for screening for lipoid disorders: Secondary | ICD-10-CM

## 2018-04-14 DIAGNOSIS — R972 Elevated prostate specific antigen [PSA]: Secondary | ICD-10-CM

## 2018-04-14 DIAGNOSIS — Z23 Encounter for immunization: Secondary | ICD-10-CM | POA: Diagnosis not present

## 2018-04-14 DIAGNOSIS — Z0000 Encounter for general adult medical examination without abnormal findings: Secondary | ICD-10-CM

## 2018-04-14 DIAGNOSIS — Z136 Encounter for screening for cardiovascular disorders: Secondary | ICD-10-CM

## 2018-04-14 NOTE — Progress Notes (Signed)
Chief Complaint  Patient presents with  . Annual Exam    Subjective:  Marcus Gutierrez is a 58 y.o. male here for a health maintenance visit.  Patient is established pt   Wt Readings from Last 3 Encounters:  04/14/18 156 lb (70.8 kg)  03/17/18 155 lb 6.4 oz (70.5 kg)  02/03/17 142 lb (64.4 kg)     Patient Active Problem List   Diagnosis Date Noted  . Depression 03/05/2015  . Rising PSA level 02/13/2015  . Colitis 12/11/2011  . Hypogonadism male 11/29/2011    Past Medical History:  Diagnosis Date  . Allergy   . GERD (gastroesophageal reflux disease)   . Seizures ([place])   . Ulcer   . Ulcerative colitis [facility])     Past Surgical History:  Procedure Laterality Date  . HERNIA REPAIR       Outpatient Medications Prior to Visit  Medication Sig Dispense Refill  . alum hydroxide-mag trisilicate (GAVISCON) 12-45 MG CHEW chewable tablet Chew by mouth.    Marland Kitchen azithromycin (ZITHROMAX) 250 MG tablet Take 2 tablets on day one then one tablet each day after 6 tablet 1  . benzoyl peroxide (BENZOYL PEROXIDE) 5 % external liquid Apply topically 2 (two) times daily. 142 g 12  . Budesonide (UCERIS PO) Take by mouth.    . cetirizine (ZYRTEC) 10 MG tablet Take 1 tablet (10 mg total) by mouth daily. 30 tablet 11  . Cholecalciferol (VITAMIN D3) 1000 UNITS CAPS Take 1 capsule by mouth daily.    Marland Kitchen esomeprazole (NEXIUM) 40 MG capsule Take 1 capsule (40 mg total) by mouth daily before breakfast. 30 capsule 12  . fluticasone (FLONASE) 50 MCG/ACT nasal spray Place 2 sprays into both nostrils daily. 16 g 6  . hydrocortisone 2.5 % ointment Apply topically 2 (two) times daily. 30 g 3  . Ketoconazole 2 % FOAM Apply topically to the affected areas twice a day for 2 weeks 100 g 0  . mesalamine (LIALDA) 1.2 G EC tablet Take 1,200 mg by mouth daily with breakfast.    . PAZEO 0.7 % SOLN Place 1 drop into both eyes daily as needed. 2.5 mL 0  . sildenafil (VIAGRA) 100 MG tablet Take 0.5-1 tablets (50-100 mg  total) by mouth daily as needed for erectile dysfunction. 5 tablet 11  . testosterone cypionate (DEPOTESTOSTERONE CYPIONATE) 200 MG/ML injection Inject 1 ml into the muscle every 14 days 6 mL 1   Facility-Administered Medications Prior to Visit  Medication Dose Route Frequency Provider Last Rate Last Dose  . testosterone cypionate (DEPOTESTOSTERONE CYPIONATE) injection 100 mg  100 mg Intramuscular Once Stallings, Zoe A, MD      . testosterone cypionate (DEPOTESTOSTERONE CYPIONATE) injection 200 mg  200 mg Intramuscular Q14 Days Stallings, Zoe A, MD   200 mg at 04/06/18 1106    Allergies  Allergen Reactions  . Amoxicillin Other (See Comments)    hematochezia  . Androgel [Testosterone]      Family History  Problem Relation Age of Onset  . Cancer Mother   . Heart attack Mother   . Heart attack Father   . Lupus Sister   . Asthma Son      Health Habits: Dental Exam: up to date Eye Exam: up to date Exercise: 5 times/week on average Current exercise activities: walking/running Diet: balanced   Social History   Socioeconomic History  . Marital status: Married    Spouse name: Not on file  . Number of children: Not on file  .  Years of education: Not on file  . Highest education level: Not on file  Occupational History  . Not on file  Social Needs  . Financial resource strain: Not on file  . Food insecurity:    Worry: Not on file    Inability: Not on file  . Transportation needs:    Medical: Not on file    Non-medical: Not on file  Tobacco Use  . Smoking status: Former Smoker    Last attempt to quit: 06/01/2003    Years since quitting: 14.8  . Smokeless tobacco: Never Used  Substance and Sexual Activity  . Alcohol use: No    Alcohol/week: 0.0 standard drinks  . Drug use: No  . Sexual activity: Not on file  Lifestyle  . Physical activity:    Days per week: Not on file    Minutes per session: Not on file  . Stress: Not on file  Relationships  . Social  connections:    Talks on phone: Not on file    Gets together: Not on file    Attends religious service: Not on file    Active member of club or organization: Not on file    Attends meetings of clubs or organizations: Not on file    Relationship status: Not on file  . Intimate partner violence:    Fear of current or ex partner: Not on file    Emotionally abused: Not on file    Physically abused: Not on file    Forced sexual activity: Not on file  Other Topics Concern  . Not on file  Social History Narrative  . Not on file   Social History   Substance and Sexual Activity  Alcohol Use No  . Alcohol/week: 0.0 standard drinks   Social History   Tobacco Use  Smoking Status Former Smoker  . Last attempt to quit: 06/01/2003  . Years since quitting: 14.8  Smokeless Tobacco Never Used   Social History   Substance and Sexual Activity  Drug Use No     Health Maintenance: See under health Maintenance activity for review of completion dates as well. Immunization History  Administered Date(s) Administered  . Influenza,inj,Quad PF,6+ Mos 01/05/2013, 01/24/2014, 10/18/2014  . Td 12/12/2006  . Tdap 04/14/2018     Depression Screen-PHQ2/9 Depression screen [facility] 2/9 04/14/2018 03/17/2018 02/03/2017 01/08/2017 06/04/2016  Decreased Interest 0 0 0 0 0  Down, Depressed, Hopeless 0 0 0 0 0  PHQ - 2 Score 0 0 0 0 0     Depression Severity and Treatment Recommendations:  0-4= None  5-9= Mild / Treatment: Support, educate to call if worse; return in one month  10-14= Moderate / Treatment: Support, watchful waiting; Antidepressant or Psycotherapy  15-19= Moderately severe / Treatment: Antidepressant OR Psychotherapy  >= 20 = Major depression, severe / Antidepressant AND Psychotherapy    Review of Systems   ROS  See HPI for ROS as well.   Review of Systems  Constitutional: Negative for activity change, appetite change, chills and fever.  HENT: Negative for congestion, nosebleeds,  trouble swallowing and voice change.   Respiratory: Negative for cough, shortness of breath and wheezing.   Gastrointestinal: Negative for diarrhea, nausea and vomiting.  Genitourinary: Negative for difficulty urinating, dysuria, flank pain and hematuria.  Rectal:  Trace blood on toilet paper with wiping but no blood in stool, no rectal itching Musculoskeletal: Negative for back pain, joint swelling and neck pain.  Neurological: Negative for dizziness, speech difficulty, light-headedness and  numbness.  See HPI. All other review of systems negative.    Objective:   Vitals:   04/14/18 1144  BP: 110/71  Pulse: (!) 103  Resp: 16  Temp: 98 F (36.7 C)  TempSrc: Oral  SpO2: 96%  Weight: 156 lb (70.8 kg)  Height: 5' 8.5" (1.74 m)    Body mass index is 23.37 kg/m.  Physical Exam Constitutional:      Appearance: Normal appearance. He is normal weight.  HENT:     Head: Normocephalic and atraumatic.     Right Ear: Tympanic membrane, ear canal and external ear normal.     Left Ear: Tympanic membrane, ear canal and external ear normal.     Nose: Nose normal. No congestion.  Eyes:     Extraocular Movements: Extraocular movements intact.     Conjunctiva/sclera: Conjunctivae normal.  Neck:     Musculoskeletal: Normal range of motion and neck supple. No neck rigidity or muscular tenderness.     Comments: No thyroid masses, normal size  Cardiovascular:     Rate and Rhythm: Normal rate and regular rhythm.     Heart sounds: No murmur.  Pulmonary:     Effort: Pulmonary effort is normal. No respiratory distress.     Breath sounds: Normal breath sounds. No wheezing.  Abdominal:     General: Abdomen is flat. Bowel sounds are normal. There is no distension.     Palpations: There is no mass.     Tenderness: There is no abdominal tenderness.     Hernia: No hernia is present.  Skin:    General: Skin is warm.     Capillary Refill: Capillary refill takes less than 2 seconds.     Coloration:  Skin is not jaundiced.     Findings: No rash.  Neurological:     General: No focal deficit present.     Mental Status: He is alert and oriented to person, place, and time.  Psychiatric:        Mood and Affect: Mood normal.        Behavior: Behavior normal.        Thought Content: Thought content normal.        Judgment: Judgment normal.      Assessment/Plan:   Patient was seen for a health maintenance exam.  Counseled the patient on health maintenance issues. Reviewed her health mainteance schedule and ordered appropriate tests (see orders.) Counseled on regular exercise and weight management. Recommend regular eye exams and dental cleaning.   The following issues were addressed today for health maintenance:   Rhet was seen today for annual exam.  Diagnoses and all orders for this visit:  Health maintenance examination-  Discussed age appropriate screenings Pt will call his GI at [facility] for colon cancer screening Eye exam, dental exam up to date  Screening for prostate cancer -     PSA  Encounter for medication monitoring- due to testosterone supplementation will check hemoglobin, liver function and lipid -     CBC with Differential/Platelet -     Comprehensive metabolic panel -     Lipid panel  Hypogonadism in male -     TestT+TestF+SHBG  Rising PSA level -     PSA  Need for vaccination -     Tdap vaccine greater than or equal to 7yo IM  Encounter for lipid screening for cardiovascular disease -     Comprehensive metabolic panel    Return if symptoms worsen or fail to  improve.    Body mass index is 23.37 kg/m.:  Discussed the patient's BMI with patient. The BMI body mass index is 23.37 kg/m.     Future Appointments  Date Time Provider [place]  04/20/2018 11:20 AM PCP-NURSE [facility]  06/01/2018  8:20 AM Forrest Moron, MD PCP-PCP [facility]    Patient Instructions       If you have lab work done today you will be  contacted with your lab results within the next 2 weeks.  If you have not heard from [place] then please contact us. The fastest way to get your results is to register for My Chart.   IF you received an x-ray today, you will receive an invoice from [facility]. Please contact [facility] at [phone] with questions or concerns regarding your invoice.   IF you received labwork today, you will receive an invoice from [facility]. Please contact [facility] at [phone] with questions or concerns regarding your invoice.   Our billing staff will not be able to assist you with questions regarding bills from these companies.  You will be contacted with the lab results as soon as they are available. The fastest way to get your results is to activate your My Chart account. Instructions are located on the last page of this paperwork. If you have not heard from [place] regarding the results in 2 weeks, please contact this office.      Cancer Screening for Men A cancer screening is a test or exam that checks for cancer. Your health care provider will recommend specific cancer screenings based on your age, personal history, and family history of cancer. Work with your health care provider to create a cancer screening schedule that protects your health. Why is cancer screening done? Cancer screening is done to look for cancer in the very early stages, before it spreads and becomes harder to treat and before you would start to notice symptoms. Finding cancer early improves the chances of successful treatment. It may save your life. Who should be screened for cancer? All men should be screened for colorectal cancer and skin cancer. Your health care provider may recommend screenings for other types of cancer if:  You had cancer before.  You have a family member with cancer.  You have abnormal genes that could increase the risk of cancer.  You have risk factors for certain cancers, such as  smoking. When you should be screened for cancer depends on:  Your age.  Your medical history and your family's medical history.  Certain lifestyle factors, such as smoking.  Environmental exposure, such as to asbestos. What are some common cancer screenings? Lung cancer Lung cancer screening is done with a CT scan that looks for abnormal cells in the lungs. Discuss lung cancer screening with your health care provider if you are 95-93 years old and if any of the following apply to you:  You currently smoke.  You used to smoke heavily.  You have a smoking history of 1 pack a day for 30 years or 2 packs a day for 15 years.  You have quit smoking within the past 15 years. If you smoke heavily or if you used to smoke, you may need to be screened every year. Prostate cancer Prostate cancer screening is done with blood tests and an exam in which a health care provider uses a gloved finger to check prostate size (digital rectal exam). You may need to be screened for prostate cancer if:  You  have risk factors of prostate cancer, such as being African American or having a close family member with prostate cancer.  You have inherited gene changes or a genetic condition, including BRCA1 or BRCA2 gene mutations or Lynch syndrome.  You have symptoms of prostate cancer, such as problems urinating or erectile dysfunction. Prostate cancer screening for men with average risk may start at age 10. Men with risk factors may need to be screened earlier at age 56-45. Once you have been screened for prostate cancer, future screening may be recommended based on the results of your blood tests. Colorectal cancer  All adults should have screening for colorectal cancer starting at age 57 and continuing until age 35. Your health care provider may recommend screening at age 8. You will have tests every 1-10 years, depending on your results and the type of screening test. If you have a family history of colon or  rectal cancer or other risk factors, you may need to start having screenings earlier. Talk with your health care provider about which screening test is right for you and how often you should be screened. Colorectal cancer screening looks for cancer or for growths called polyps that often form before cancer starts. Tests to look for cancer or polyps include:  Colonoscopy or flexible sigmoidoscopy. For these procedures, a flexible tube with a small camera is inserted into the rectum.  CT colonography. This test uses X-rays and a contrast dye to check the colon for polyps. If a polyp is found, you may need to have a colonoscopy so the polyp can be located and removed. Tests to look for cancer in the stool (feces) include:  Guaiac-based fecal occult blood test (FOBT). This test detects blood in stool. It can be done at home with a kit.  Fecal immunochemical test (FIT). This test detects blood in stool. For this test, you will need to collect stool samples at home.  Stool DNA test. This test looks for blood in stool and any changes in DNA that can lead to colon cancer. For this test, you will need to collect a stool sample at home and send it to a lab.  Skin cancer Skin cancer screening is done by checking the skin for unusual moles or spots and any changes in existing moles. Your health care provider should check your skin for signs of skin cancer at every physical exam. You should check your skin every month and tell your health care provider right away if anything looks unusual. Men with a higher-than-normal risk for skin cancer may want to see a skin specialist (dermatologist) for an annual body check. Where to find more information  [place]: SkinPromotion.no  Centers for Disease Control and Prevention: http://knight-sullivan.biz/  American Cancer Society:  https://www.cancer.org/latest-news/4-cancer-screening-tests-for-men.html Contact a health care provider if:  You have concerns about any signs or symptoms of cancer, such as: ? Moles that have an unusual shape or color. ? Changes in existing moles. ? A sore on your skin that does not heal. ? Blood in your urine or stool. ? Fatigue that does not go away. ? Frequent pain or cramping in your abdomen. ? Coughing or trouble breathing that does not go away. ? Coughing up blood. ? Losing weight without trying. ? Changes in urination habits. ? Painful urination or ejaculation. Summary  Be aware of and watch for signs and symptoms of cancer, especially symptoms of lung cancer, prostate cancer, colorectal cancer, and skin cancer.  Early detection of cancer with cancer  screening may save your life.  Talk with your health care provider about your specific cancer risks.  Work together with your health care provider to create a cancer screening plan that is right for you. This information is not intended to replace advice given to you by your health care provider. Make sure you discuss any questions you have with your health care provider. Document Released: 10/26/2015 Document Revised: 10/17/2017 Document Reviewed: 10/26/2015 Elsevier Interactive Patient Education  [employer].

## 2018-04-14 NOTE — Patient Instructions (Addendum)
   If you have lab work done today you will be contacted with your lab results within the next 2 weeks.  If you have not heard from us then please contact us. The fastest way to get your results is to register for My Chart.   IF you received an x-ray today, you will receive an invoice from [facility]. Please contact  Radiology at [phone] with questions or concerns regarding your invoice.   IF you received labwork today, you will receive an invoice from [facility]. Please contact [facility] at [phone] with questions or concerns regarding your invoice.   Our billing staff will not be able to assist you with questions regarding bills from these companies.  You will be contacted with the lab results as soon as they are available. The fastest way to get your results is to activate your My Chart account. Instructions are located on the last page of this paperwork. If you have not heard from us regarding the results in 2 weeks, please contact this office.     Cancer Screening for Men A cancer screening is a test or exam that checks for cancer. Your health care provider will recommend specific cancer screenings based on your age, personal history, and family history of cancer. Work with your health care provider to create a cancer screening schedule that protects your health. Why is cancer screening done? Cancer screening is done to look for cancer in the very early stages, before it spreads and becomes harder to treat and before you would start to notice symptoms. Finding cancer early improves the chances of successful treatment. It may save your life. Who should be screened for cancer? All men should be screened for colorectal cancer and skin cancer. Your health care provider may recommend screenings for other types of cancer if:  You had cancer before.  You have a family member with cancer.  You have abnormal genes that could increase the risk of cancer.  You have  risk factors for certain cancers, such as smoking. When you should be screened for cancer depends on:  Your age.  Your medical history and your family's medical history.  Certain lifestyle factors, such as smoking.  Environmental exposure, such as to asbestos. What are some common cancer screenings? Lung cancer Lung cancer screening is done with a CT scan that looks for abnormal cells in the lungs. Discuss lung cancer screening with your health care provider if you are 55-74 years old and if any of the following apply to you:  You currently smoke.  You used to smoke heavily.  You have a smoking history of 1 pack a day for 30 years or 2 packs a day for 15 years.  You have quit smoking within the past 15 years. If you smoke heavily or if you used to smoke, you may need to be screened every year. Prostate cancer Prostate cancer screening is done with blood tests and an exam in which a health care provider uses a gloved finger to check prostate size (digital rectal exam). You may need to be screened for prostate cancer if:  You have risk factors of prostate cancer, such as being African American or having a close family member with prostate cancer.  You have inherited gene changes or a genetic condition, including BRCA1 or BRCA2 gene mutations or Lynch syndrome.  You have symptoms of prostate cancer, such as problems urinating or erectile dysfunction. Prostate cancer screening for men with average risk may start at age   50. Men with risk factors may need to be screened earlier at age 40-45. Once you have been screened for prostate cancer, future screening may be recommended based on the results of your blood tests. Colorectal cancer  All adults should have screening for colorectal cancer starting at age 50 and continuing until age 75. Your health care provider may recommend screening at age 45. You will have tests every 1-10 years, depending on your results and the type of screening  test. If you have a family history of colon or rectal cancer or other risk factors, you may need to start having screenings earlier. Talk with your health care provider about which screening test is right for you and how often you should be screened. Colorectal cancer screening looks for cancer or for growths called polyps that often form before cancer starts. Tests to look for cancer or polyps include:  Colonoscopy or flexible sigmoidoscopy. For these procedures, a flexible tube with a small camera is inserted into the rectum.  CT colonography. This test uses X-rays and a contrast dye to check the colon for polyps. If a polyp is found, you may need to have a colonoscopy so the polyp can be located and removed. Tests to look for cancer in the stool (feces) include:  Guaiac-based fecal occult blood test (FOBT). This test detects blood in stool. It can be done at home with a kit.  Fecal immunochemical test (FIT). This test detects blood in stool. For this test, you will need to collect stool samples at home.  Stool DNA test. This test looks for blood in stool and any changes in DNA that can lead to colon cancer. For this test, you will need to collect a stool sample at home and send it to a lab.  Skin cancer Skin cancer screening is done by checking the skin for unusual moles or spots and any changes in existing moles. Your health care provider should check your skin for signs of skin cancer at every physical exam. You should check your skin every month and tell your health care provider right away if anything looks unusual. Men with a higher-than-normal risk for skin cancer may want to see a skin specialist (dermatologist) for an annual body check. Where to find more information  National Cancer Institute: https://www.cancer.gov/about-cancer/screening  Centers for Disease Control and Prevention: https://www.cdc.gov/cancer/dcpc/prevention/screening.htm  American Cancer Society:  https://www.cancer.org/latest-news/4-cancer-screening-tests-for-men.html Contact a health care provider if:  You have concerns about any signs or symptoms of cancer, such as: ? Moles that have an unusual shape or color. ? Changes in existing moles. ? A sore on your skin that does not heal. ? Blood in your urine or stool. ? Fatigue that does not go away. ? Frequent pain or cramping in your abdomen. ? Coughing or trouble breathing that does not go away. ? Coughing up blood. ? Losing weight without trying. ? Changes in urination habits. ? Painful urination or ejaculation. Summary  Be aware of and watch for signs and symptoms of cancer, especially symptoms of lung cancer, prostate cancer, colorectal cancer, and skin cancer.  Early detection of cancer with cancer screening may save your life.  Talk with your health care provider about your specific cancer risks.  Work together with your health care provider to create a cancer screening plan that is right for you. This information is not intended to replace advice given to you by your health care provider. Make sure you discuss any questions you have with your health care provider.   Document Released: 10/26/2015 Document Revised: 10/17/2017 Document Reviewed: 10/26/2015 Elsevier Interactive Patient Education  2019 Elsevier Inc.  

## 2018-04-17 LAB — COMPREHENSIVE METABOLIC PANEL
ALT: 5 IU/L (ref 0–44)
AST: 13 IU/L (ref 0–40)
Albumin/Globulin Ratio: 1.3 (ref 1.2–2.2)
Albumin: 4 g/dL (ref 3.8–4.9)
Alkaline Phosphatase: 93 IU/L (ref 39–117)
BUN/Creatinine Ratio: 11 (ref 9–20)
BUN: 11 mg/dL (ref 6–24)
Bilirubin Total: 0.3 mg/dL (ref 0.0–1.2)
CO2: 19 mmol/L — ABNORMAL LOW (ref 20–29)
Calcium: 8.8 mg/dL (ref 8.7–10.2)
Chloride: 104 mmol/L (ref 96–106)
Creatinine, Ser: 1.04 mg/dL (ref 0.76–1.27)
GFR calc Af Amer: 92 mL/min/{1.73_m2} (ref >59)
GFR calc non Af Amer: 80 mL/min/{1.73_m2} (ref >59)
Globulin, Total: 3 g/dL (ref 1.5–4.5)
Glucose: 90 mg/dL (ref 65–99)
Potassium: 4.5 mmol/L (ref 3.5–5.2)
Sodium: 140 mmol/L (ref 134–144)
Total Protein: 7 g/dL (ref 6.0–8.5)

## 2018-04-17 LAB — CBC WITH DIFFERENTIAL/PLATELET
Basophils Absolute: 0 10*3/uL (ref 0.0–0.2)
Basos: 1 %
EOS (ABSOLUTE): 0.2 10*3/uL (ref 0.0–0.4)
Eos: 2 %
Hematocrit: 41.4 % (ref 37.5–51.0)
Hemoglobin: 13.4 g/dL (ref 13.0–17.7)
Immature Grans (Abs): 0 10*3/uL (ref 0.0–0.1)
Immature Granulocytes: 0 %
Lymphocytes Absolute: 2.7 10*3/uL (ref 0.7–3.1)
Lymphs: 37 %
MCH: 27.7 pg (ref 26.6–33.0)
MCHC: 32.4 g/dL (ref 31.5–35.7)
MCV: 86 fL (ref 79–97)
Monocytes Absolute: 0.9 10*3/uL (ref 0.1–0.9)
Monocytes: 12 %
Neutrophils Absolute: 3.5 10*3/uL (ref 1.4–7.0)
Neutrophils: 48 %
Platelets: 329 10*3/uL (ref 150–450)
RBC: 4.84 x10E6/uL (ref 4.14–5.80)
RDW: 13.9 % (ref 11.6–15.4)
WBC: 7.4 10*3/uL (ref 3.4–10.8)

## 2018-04-17 LAB — LIPID PANEL
Chol/HDL Ratio: 2.4 ratio (ref 0.0–5.0)
Cholesterol, Total: 128 mg/dL (ref 100–199)
HDL: 53 mg/dL (ref >39)
LDL Calculated: 64 mg/dL (ref 0–99)
Triglycerides: 53 mg/dL (ref 0–149)
VLDL Cholesterol Cal: 11 mg/dL (ref 5–40)

## 2018-04-17 LAB — PSA: Prostate Specific Ag, Serum: 1.2 ng/mL (ref 0.0–4.0)

## 2018-04-17 LAB — TESTT+TESTF+SHBG
Sex Hormone Binding: 26.7 nmol/L (ref 19.3–76.4)
Testosterone, Free: 18.3 pg/mL (ref 7.2–24.0)
Testosterone, Total, LC/MS: 667.7 ng/dL (ref 264.0–916.0)

## 2018-04-20 ENCOUNTER — Ambulatory Visit: Payer: BC Managed Care – PPO

## 2018-04-20 ENCOUNTER — Ambulatory Visit (INDEPENDENT_AMBULATORY_CARE_PROVIDER_SITE_OTHER): Payer: BC Managed Care – PPO | Admitting: Family Medicine

## 2018-04-20 DIAGNOSIS — E291 Testicular hypofunction: Secondary | ICD-10-CM

## 2018-04-20 NOTE — Patient Instructions (Signed)
Testosterone shot  given in the right glut, pt tolerated well.

## 2018-05-04 ENCOUNTER — Ambulatory Visit (INDEPENDENT_AMBULATORY_CARE_PROVIDER_SITE_OTHER): Payer: BC Managed Care – PPO | Admitting: Family Medicine

## 2018-05-04 ENCOUNTER — Other Ambulatory Visit: Payer: Self-pay

## 2018-05-04 DIAGNOSIS — E291 Testicular hypofunction: Secondary | ICD-10-CM | POA: Diagnosis not present

## 2018-05-04 NOTE — Patient Instructions (Signed)
Injection given in the left glut, pt tolerated well.  

## 2018-05-18 ENCOUNTER — Other Ambulatory Visit: Payer: Self-pay

## 2018-05-18 ENCOUNTER — Ambulatory Visit (INDEPENDENT_AMBULATORY_CARE_PROVIDER_SITE_OTHER): Payer: BC Managed Care – PPO | Admitting: Family Medicine

## 2018-05-18 DIAGNOSIS — E291 Testicular hypofunction: Secondary | ICD-10-CM | POA: Diagnosis not present

## 2018-06-01 ENCOUNTER — Encounter: Payer: BC Managed Care – PPO | Admitting: Family Medicine

## 2018-06-01 ENCOUNTER — Ambulatory Visit (INDEPENDENT_AMBULATORY_CARE_PROVIDER_SITE_OTHER): Payer: BC Managed Care – PPO | Admitting: Family Medicine

## 2018-06-01 ENCOUNTER — Other Ambulatory Visit: Payer: Self-pay

## 2018-06-01 DIAGNOSIS — E291 Testicular hypofunction: Secondary | ICD-10-CM | POA: Diagnosis not present

## 2018-06-01 NOTE — Patient Instructions (Signed)
Injection given in the left buttock, he tolerated well.

## 2018-06-15 ENCOUNTER — Ambulatory Visit: Payer: BC Managed Care – PPO

## 2018-06-22 ENCOUNTER — Ambulatory Visit: Payer: BC Managed Care – PPO

## 2018-06-29 ENCOUNTER — Other Ambulatory Visit: Payer: Self-pay

## 2018-06-29 ENCOUNTER — Ambulatory Visit (INDEPENDENT_AMBULATORY_CARE_PROVIDER_SITE_OTHER): Payer: BC Managed Care – PPO | Admitting: Family Medicine

## 2018-06-29 DIAGNOSIS — E291 Testicular hypofunction: Secondary | ICD-10-CM

## 2018-06-29 NOTE — Patient Instructions (Signed)
Injection Given in the right glut. , pt tolerated well.  

## 2018-07-13 ENCOUNTER — Ambulatory Visit (INDEPENDENT_AMBULATORY_CARE_PROVIDER_SITE_OTHER): Payer: BC Managed Care – PPO | Admitting: Family Medicine

## 2018-07-13 ENCOUNTER — Other Ambulatory Visit: Payer: Self-pay

## 2018-07-13 VITALS — HR 120 | Temp 98.0°F

## 2018-07-13 DIAGNOSIS — E291 Testicular hypofunction: Secondary | ICD-10-CM

## 2018-07-27 ENCOUNTER — Other Ambulatory Visit: Payer: Self-pay

## 2018-07-27 ENCOUNTER — Ambulatory Visit (INDEPENDENT_AMBULATORY_CARE_PROVIDER_SITE_OTHER): Payer: BC Managed Care – PPO | Admitting: Family Medicine

## 2018-07-27 DIAGNOSIS — E291 Testicular hypofunction: Secondary | ICD-10-CM | POA: Diagnosis not present

## 2018-07-27 NOTE — Patient Instructions (Signed)
Injection received on the ride side in the glut

## 2018-08-10 ENCOUNTER — Other Ambulatory Visit: Payer: Self-pay

## 2018-08-10 ENCOUNTER — Ambulatory Visit (INDEPENDENT_AMBULATORY_CARE_PROVIDER_SITE_OTHER): Payer: BC Managed Care – PPO | Admitting: Family Medicine

## 2018-08-10 DIAGNOSIS — E291 Testicular hypofunction: Secondary | ICD-10-CM

## 2018-08-10 NOTE — Patient Instructions (Signed)
Injection given in the right Glut area, pt tolerated well.

## 2018-08-24 ENCOUNTER — Ambulatory Visit (INDEPENDENT_AMBULATORY_CARE_PROVIDER_SITE_OTHER): Payer: BC Managed Care – PPO | Admitting: Family Medicine

## 2018-08-24 ENCOUNTER — Other Ambulatory Visit: Payer: Self-pay

## 2018-08-24 DIAGNOSIS — E291 Testicular hypofunction: Secondary | ICD-10-CM

## 2018-08-24 NOTE — Patient Instructions (Signed)
Testosterone given in left glut, pt tolerated well

## 2018-09-07 ENCOUNTER — Other Ambulatory Visit: Payer: Self-pay

## 2018-09-07 ENCOUNTER — Ambulatory Visit (INDEPENDENT_AMBULATORY_CARE_PROVIDER_SITE_OTHER): Payer: BC Managed Care – PPO | Admitting: Family Medicine

## 2018-09-07 DIAGNOSIS — E291 Testicular hypofunction: Secondary | ICD-10-CM | POA: Diagnosis not present

## 2018-09-07 NOTE — Patient Instructions (Signed)
Injection given in the right glut. Pt tolerated well.

## 2018-09-08 ENCOUNTER — Other Ambulatory Visit: Payer: Self-pay | Admitting: Family Medicine

## 2018-09-08 DIAGNOSIS — E291 Testicular hypofunction: Secondary | ICD-10-CM

## 2018-09-21 ENCOUNTER — Other Ambulatory Visit: Payer: Self-pay

## 2018-09-21 ENCOUNTER — Ambulatory Visit (INDEPENDENT_AMBULATORY_CARE_PROVIDER_SITE_OTHER): Payer: BC Managed Care – PPO | Admitting: Family Medicine

## 2018-09-21 DIAGNOSIS — E291 Testicular hypofunction: Secondary | ICD-10-CM

## 2018-09-21 NOTE — Patient Instructions (Signed)
Injection Given in the left glut, he tolerated well

## 2018-10-05 ENCOUNTER — Ambulatory Visit: Payer: BC Managed Care – PPO

## 2018-10-05 ENCOUNTER — Ambulatory Visit (INDEPENDENT_AMBULATORY_CARE_PROVIDER_SITE_OTHER): Payer: BC Managed Care – PPO | Admitting: Family Medicine

## 2018-10-05 ENCOUNTER — Other Ambulatory Visit: Payer: Self-pay

## 2018-10-05 DIAGNOSIS — E291 Testicular hypofunction: Secondary | ICD-10-CM

## 2018-10-20 ENCOUNTER — Other Ambulatory Visit: Payer: Self-pay

## 2018-10-20 ENCOUNTER — Ambulatory Visit (INDEPENDENT_AMBULATORY_CARE_PROVIDER_SITE_OTHER): Payer: BC Managed Care – PPO | Admitting: Registered Nurse

## 2018-10-20 DIAGNOSIS — E291 Testicular hypofunction: Secondary | ICD-10-CM

## 2018-10-20 NOTE — Patient Instructions (Signed)
Injection given the the left glut, pt tolerated well.

## 2018-11-02 ENCOUNTER — Other Ambulatory Visit: Payer: Self-pay

## 2018-11-02 ENCOUNTER — Ambulatory Visit (INDEPENDENT_AMBULATORY_CARE_PROVIDER_SITE_OTHER): Payer: BC Managed Care – PPO | Admitting: Family Medicine

## 2018-11-02 DIAGNOSIS — E291 Testicular hypofunction: Secondary | ICD-10-CM

## 2018-11-02 NOTE — Patient Instructions (Signed)
Injection given in the right glut, pt tolerated well.  

## 2018-11-16 ENCOUNTER — Ambulatory Visit: Payer: BC Managed Care – PPO

## 2018-11-30 ENCOUNTER — Ambulatory Visit (INDEPENDENT_AMBULATORY_CARE_PROVIDER_SITE_OTHER): Payer: BC Managed Care – PPO | Admitting: Family Medicine

## 2018-11-30 ENCOUNTER — Other Ambulatory Visit: Payer: Self-pay

## 2018-11-30 DIAGNOSIS — E291 Testicular hypofunction: Secondary | ICD-10-CM | POA: Diagnosis not present

## 2018-12-14 ENCOUNTER — Other Ambulatory Visit: Payer: Self-pay

## 2018-12-14 ENCOUNTER — Ambulatory Visit (INDEPENDENT_AMBULATORY_CARE_PROVIDER_SITE_OTHER): Payer: BC Managed Care – PPO | Admitting: Family Medicine

## 2018-12-14 DIAGNOSIS — E291 Testicular hypofunction: Secondary | ICD-10-CM

## 2018-12-14 NOTE — Patient Instructions (Signed)
Injection Given in the right glut. , pt tolerated well.

## 2018-12-16 ENCOUNTER — Encounter: Payer: Self-pay | Admitting: Family Medicine

## 2018-12-17 ENCOUNTER — Encounter: Payer: Self-pay | Admitting: Family Medicine

## 2018-12-17 ENCOUNTER — Other Ambulatory Visit: Payer: Self-pay

## 2018-12-17 ENCOUNTER — Telehealth (INDEPENDENT_AMBULATORY_CARE_PROVIDER_SITE_OTHER): Payer: BC Managed Care – PPO | Admitting: Family Medicine

## 2018-12-17 VITALS — Ht 69.0 in | Wt 152.0 lb

## 2018-12-17 DIAGNOSIS — R519 Headache, unspecified: Secondary | ICD-10-CM | POA: Diagnosis not present

## 2018-12-17 MED ORDER — CETIRIZINE HCL 10 MG PO TABS: 10.0000 mg | ORAL_TABLET | Freq: Every day | ORAL | 11 refills | Status: DC

## 2018-12-17 MED ORDER — FLUTICASONE PROPIONATE 50 MCG/ACT NA SUSP: 1.0000 | Freq: Two times a day (BID) | NASAL | 6 refills | Status: DC

## 2018-12-17 MED ORDER — PHENYLEPHRINE HCL 10 MG PO TABS: 10.0000 mg | ORAL_TABLET | ORAL | 0 refills | Status: DC | PRN

## 2018-12-17 NOTE — Patient Instructions (Signed)
Sinus Headache  A sinus headache occurs when your sinuses become clogged or swollen. Sinuses are air-filled spaces in your skull that are behind the bones of your face and forehead. Sinus headaches can range from mild to severe. What are the causes? A sinus headache can result from various conditions that affect the sinuses. Common causes include:  Colds.  Sinus infections.  Allergies. Many people confuse sinus headaches with migraines or tension headaches because those headaches can also cause facial pain and nasal symptoms. What are the signs or symptoms? The main symptom of this condition is a headache that may feel like pain or pressure in your face, forehead, ears, or upper teeth. People who have a sinus headache often have other symptoms, such as:  Congested or runny nose.  Fever.  Inability to smell. Weather changes can make symptoms worse. How is this diagnosed? This condition may be diagnosed based on:  A physical exam and medical history.  Imaging tests, such as a CT scan or MRI, to check for problems with the sinuses.  Examination of the sinuses using a thin tool with a camera that is inserted through your nose (endoscopy). How is this treated? Treatment for this condition depends on the cause.  Sinus pain that is caused by a sinus infection may be treated with antibiotic medicine.  Sinus pain that is caused by allergies may be helped by allergy medicines (antihistamines) and medicated nasal sprays.  Sinus pain that is caused by congestion may be helped by rinsing out (flushing) the nose and sinuses with saline solution.  Sinus surgery may be needed in some cases if other treatments do not help. Follow these instructions at home: General instructions  If directed: ? Apply a warm, moist washcloth to your face to help relieve pain. ? Use a nasal saline wash. Medicines   Take over-the-counter and prescription medicines only as told by your health care provider.   If you were prescribed an antibiotic medicine, take it as told by your health care provider. Do not stop taking the antibiotic even if you start to feel better.  If you have congestion, use a nasal spray to help lessen pressure. Hydrate and humidify  Drink enough water to keep your urine clear or pale yellow. Staying hydrated will help to thin your mucus.  Use a cool mist humidifier to keep the humidity level in your home above 50%.  Inhale steam for 10-15 minutes, 3-4 times a day or as told by your health care provider. You can do this in the bathroom while a hot shower is running.  Limit your exposure to cool or dry air. Contact a health care provider if:  You have a headache more than one time a week.  You have sensitivity to light or sound.  You develop a fever.  You feel nauseous or you vomit.  Your headaches do not get better with treatment. Many people think that they have a sinus headache when they actually have a migraine or a tension headache. Get help right away if:  You have vision problems.  You have sudden, severe pain in your face or head.  You have a seizure.  You are confused.  You have a stiff neck. Summary  A sinus headache occurs when your sinuses become clogged or swollen.  A sinus headache can result from various conditions that affect the sinuses, such as a cold, a sinus infection, or an allergy.  Treatment for this condition depends on the cause. It may include  medicine, such as antibiotics or antihistamines. This information is not intended to replace advice given to you by your health care provider. Make sure you discuss any questions you have with your health care provider. Document Released: 03/07/2004 Document Revised: 01/10/2017 Document Reviewed: 11/08/2016 Elsevier Patient Education  2020 [employer].

## 2018-12-17 NOTE — Progress Notes (Signed)
Virtual Visit Note  I connected with patient on 12/17/18 at 512pm by doxyme video and verified that I am speaking with the correct person using two identifiers. Marcus Gutierrez is currently located at home and patient is currently with them during visit. The provider, Rutherford Guys, MD is located in their office at time of visit.  I discussed the limitations, risks, security and privacy concerns of performing an evaluation and management service by telephone and the availability of in person appointments. I also discussed with the patient that there may be a patient responsible charge related to this service. The patient expressed understanding and agreed to proceed.   CC: sinus congestion  HPI  2 days of frontal low grade headache and very tender maxillary sinuses No nasal congestion or drainage No fever or chills No ear pain or sore throat No cough or sob No loss of taste or smell No dizziness Took tylenol which helped headache No sinus surgery Does not smoke?   Allergies  Allergen Reactions  . Amoxicillin Other (See Comments)    hematochezia  . Androgel [Testosterone]     Prior to Admission medications   Medication Sig Start Date End Date Taking? Authorizing Provider  alum hydroxide-mag trisilicate (GAVISCON) 55-73 MG CHEW chewable tablet Chew by mouth.   Yes [provider]  benzoyl peroxide (BENZOYL PEROXIDE) 5 % external liquid Apply topically 2 (two) times daily. 01/08/17  Yes Forrest Moron, MD  cetirizine (ZYRTEC) 10 MG tablet Take 1 tablet (10 mg total) by mouth daily. 06/04/16  Yes Forrest Moron, MD  Cholecalciferol (VITAMIN D3) 1000 UNITS CAPS Take 1 capsule by mouth daily.   Yes [provider]  esomeprazole (NEXIUM) 40 MG capsule Take 1 capsule (40 mg total) by mouth daily before breakfast. 03/22/11  Yes Daub, Loura Back, MD  PAZEO 0.7 % SOLN Place 1 drop into both eyes daily as needed. 07/03/16  Yes Forrest Moron, MD  sildenafil (VIAGRA)  100 MG tablet Take 0.5-1 tablets (50-100 mg total) by mouth daily as needed for erectile dysfunction. 12/12/17  Yes Forrest Moron, MD  testosterone cypionate (DEPOTESTOSTERONE CYPIONATE) 200 MG/ML injection INJECT 1 ML INTO THE MUSCLE EVERY 14 DAYS 09/09/18  Yes Stallings, Zoe A, MD  Budesonide (UCERIS PO) Take by mouth.    [provider]  hydrocortisone 2.5 % ointment Apply topically 2 (two) times daily. Patient not taking: Reported on 12/17/2018 01/08/17   Forrest Moron, MD  Ketoconazole 2 % FOAM Apply topically to the affected areas twice a day for 2 weeks Patient not taking: Reported on 12/17/2018 02/03/17   Forrest Moron, MD  mesalamine (LIALDA) 1.2 G EC tablet Take 1,200 mg by mouth daily with breakfast.    [provider]    Past Medical History:  Diagnosis Date  . Allergy   . GERD (gastroesophageal reflux disease)   . Seizures ([place])   . Ulcer   . Ulcerative colitis [facility])     Past Surgical History:  Procedure Laterality Date  . HERNIA REPAIR      Social History   Tobacco Use  . Smoking status: Former Smoker    Quit date: 06/01/2003    Years since quitting: 15.5  . Smokeless tobacco: Never Used  Substance Use Topics  . Alcohol use: No    Alcohol/week: 0.0 standard drinks    Family History  Problem Relation Age of Onset  . Cancer Mother   . Heart attack Mother   .  Heart attack Father   . Lupus Sister   . Asthma Son     ROS Per hpi  Objective  Vitals as reported by the patient: none Gen: AAOx3, NAD HEENT: non icteric sclera Resp: breathing comfortably, speaking in full sentences   ASSESSMENT and PLAN  1. Sinus headache Discussed no concerns for infection at this time, treat supportively with antihistamine, nasal steroids, nasal saline rinses and oral decongestants. RTC precautions given - discussed delayed abx use. Ok to call.   Other orders - cetirizine (ZYRTEC) 10 MG tablet; Take 1 tablet (10 mg total) by mouth daily. -  fluticasone (FLONASE) 50 MCG/ACT nasal spray; Place 1 spray into both nostrils 2 (two) times daily. - phenylephrine (SUDAFED PE) 10 MG TABS tablet; Take 1 tablet (10 mg total) by mouth every 4 (four) hours as needed.  FOLLOW-UP: prn   The above assessment and management plan was discussed with the patient. The patient verbalized understanding of and has agreed to the management plan. Patient is aware to call the clinic if symptoms persist or worsen. Patient is aware when to return to the clinic for a follow-up visit. Patient educated on when it is appropriate to go to the emergency department.    I provided 17 minutes of non-face-to-face time during this encounter.  Rutherford Guys, MD Primary Care at [facility] [place], [place] [address] Ph.  [phone] Fax [phone]

## 2018-12-28 ENCOUNTER — Ambulatory Visit (INDEPENDENT_AMBULATORY_CARE_PROVIDER_SITE_OTHER): Payer: BC Managed Care – PPO | Admitting: Family Medicine

## 2018-12-28 ENCOUNTER — Other Ambulatory Visit: Payer: Self-pay

## 2018-12-28 DIAGNOSIS — E291 Testicular hypofunction: Secondary | ICD-10-CM | POA: Diagnosis not present

## 2018-12-28 NOTE — Patient Instructions (Signed)
Injection Given in the left glut, he tolerated well.

## 2019-01-11 ENCOUNTER — Ambulatory Visit (INDEPENDENT_AMBULATORY_CARE_PROVIDER_SITE_OTHER): Payer: BC Managed Care – PPO | Admitting: Family Medicine

## 2019-01-11 ENCOUNTER — Other Ambulatory Visit: Payer: Self-pay

## 2019-01-11 DIAGNOSIS — E291 Testicular hypofunction: Secondary | ICD-10-CM

## 2019-01-11 NOTE — Progress Notes (Signed)
Pt came in the office for shot testosterone 200 mg 1 mL--rt glut.

## 2019-01-21 ENCOUNTER — Ambulatory Visit (INDEPENDENT_AMBULATORY_CARE_PROVIDER_SITE_OTHER): Payer: BC Managed Care – PPO | Admitting: Adult Health Nurse Practitioner

## 2019-01-21 ENCOUNTER — Other Ambulatory Visit: Payer: Self-pay

## 2019-01-21 DIAGNOSIS — E291 Testicular hypofunction: Secondary | ICD-10-CM | POA: Diagnosis not present

## 2019-01-21 NOTE — Progress Notes (Signed)
Pt came in the office for testosterone shot for Right gluteus.

## 2019-01-25 ENCOUNTER — Ambulatory Visit: Payer: BC Managed Care – PPO

## 2019-02-08 ENCOUNTER — Other Ambulatory Visit: Payer: Self-pay

## 2019-02-08 ENCOUNTER — Ambulatory Visit (INDEPENDENT_AMBULATORY_CARE_PROVIDER_SITE_OTHER): Payer: BC Managed Care – PPO | Admitting: Family Medicine

## 2019-02-08 DIAGNOSIS — E291 Testicular hypofunction: Secondary | ICD-10-CM

## 2019-02-08 NOTE — Patient Instructions (Signed)
Pt came in for Testerone injection which was administered in the Left Glute w/o any complaints.

## 2019-02-22 ENCOUNTER — Ambulatory Visit (INDEPENDENT_AMBULATORY_CARE_PROVIDER_SITE_OTHER): Payer: BC Managed Care – PPO | Admitting: Family Medicine

## 2019-02-22 DIAGNOSIS — E291 Testicular hypofunction: Secondary | ICD-10-CM

## 2019-03-08 ENCOUNTER — Ambulatory Visit (INDEPENDENT_AMBULATORY_CARE_PROVIDER_SITE_OTHER): Payer: BC Managed Care – PPO | Admitting: Family Medicine

## 2019-03-08 ENCOUNTER — Other Ambulatory Visit: Payer: Self-pay

## 2019-03-08 DIAGNOSIS — E291 Testicular hypofunction: Secondary | ICD-10-CM | POA: Diagnosis not present

## 2019-03-08 NOTE — Addendum Note (Signed)
Addended by: Amalia Hailey on: 03/08/2019 02:02 PM   Modules accepted: Orders

## 2019-03-09 ENCOUNTER — Other Ambulatory Visit: Payer: Self-pay | Admitting: Family Medicine

## 2019-03-09 DIAGNOSIS — E291 Testicular hypofunction: Secondary | ICD-10-CM

## 2019-03-09 NOTE — Telephone Encounter (Signed)
Refill for TESTOSTERONE CYP 200 MG/ML. prescription expired on 03/08/2019. No protocol assigned to this medication.

## 2019-03-10 NOTE — Telephone Encounter (Signed)
Please advise 

## 2019-03-22 ENCOUNTER — Telehealth: Payer: Self-pay | Admitting: Emergency Medicine

## 2019-03-22 ENCOUNTER — Other Ambulatory Visit: Payer: Self-pay

## 2019-03-22 ENCOUNTER — Ambulatory Visit (INDEPENDENT_AMBULATORY_CARE_PROVIDER_SITE_OTHER): Payer: BC Managed Care – PPO | Admitting: Family Medicine

## 2019-03-22 DIAGNOSIS — E291 Testicular hypofunction: Secondary | ICD-10-CM | POA: Diagnosis not present

## 2019-03-22 NOTE — Telephone Encounter (Signed)
Patient was dx with Crohn's and was told by his Endo that he will need to get pneumonia shot. Patient would like to get this shot in 2 week with his next testosterone injection. Is this ok to do if it do not show up in health maintenance. If this is ok to do which one is patient supposed to get the 13 or the 23?

## 2019-03-29 NOTE — Telephone Encounter (Signed)
Spoke with pt advised it is ok to receive the pneumovax 23 along with his testosterone injection on 04/05/2019 and I will place the order for the pneumovax.  Pt agreeable.

## 2019-03-29 NOTE — Telephone Encounter (Signed)
He can get the pneumonia 23 (pneumovax)  He can get it at the same time as his testosterone.

## 2019-04-05 ENCOUNTER — Ambulatory Visit (INDEPENDENT_AMBULATORY_CARE_PROVIDER_SITE_OTHER): Payer: BC Managed Care – PPO | Admitting: Family Medicine

## 2019-04-05 ENCOUNTER — Other Ambulatory Visit: Payer: Self-pay

## 2019-04-05 DIAGNOSIS — Z23 Encounter for immunization: Secondary | ICD-10-CM

## 2019-04-05 DIAGNOSIS — E291 Testicular hypofunction: Secondary | ICD-10-CM

## 2019-04-05 NOTE — Patient Instructions (Signed)
Pt received testosterone injection, given on left side upper glut along with p23 imm in the right arm

## 2019-04-19 ENCOUNTER — Ambulatory Visit (INDEPENDENT_AMBULATORY_CARE_PROVIDER_SITE_OTHER): Payer: BC Managed Care – PPO | Admitting: Family Medicine

## 2019-04-19 ENCOUNTER — Other Ambulatory Visit: Payer: Self-pay

## 2019-04-19 DIAGNOSIS — E291 Testicular hypofunction: Secondary | ICD-10-CM

## 2019-04-19 NOTE — Patient Instructions (Signed)
Pt received testosterone injection on right side

## 2019-04-22 ENCOUNTER — Ambulatory Visit: Payer: BC Managed Care – PPO | Attending: Family

## 2019-04-22 DIAGNOSIS — Z23 Encounter for immunization: Secondary | ICD-10-CM

## 2019-04-22 NOTE — Progress Notes (Signed)
   Covid-19 Vaccination Clinic  Name:  Marcus Gutierrez    MRN: [id number] DOB: [birth year]  04/22/2019  Mr. Neyhart was observed post Covid-19 immunization for 15 minutes without incident. He was provided with Vaccine Information Sheet and instruction to access the V-Safe system.   Mr. Kassel was instructed to call 911 with any severe reactions post vaccine: Marland Kitchen Difficulty breathing  . Swelling of face and throat  . A fast heartbeat  . A bad rash all over body  . Dizziness and weakness   Immunizations Administered    Name Date Dose VIS Date Route   Moderna COVID-19 Vaccine 04/22/2019 12:01 PM 0.5 mL 01/12/2019 Intramuscular   Manufacturer: Moderna   Lot: [id number]   [place]: [id number]

## 2019-05-03 ENCOUNTER — Ambulatory Visit: Payer: BC Managed Care – PPO

## 2019-05-03 ENCOUNTER — Ambulatory Visit (INDEPENDENT_AMBULATORY_CARE_PROVIDER_SITE_OTHER): Payer: BC Managed Care – PPO | Admitting: Family Medicine

## 2019-05-03 ENCOUNTER — Other Ambulatory Visit: Payer: Self-pay

## 2019-05-03 DIAGNOSIS — E291 Testicular hypofunction: Secondary | ICD-10-CM | POA: Diagnosis not present

## 2019-05-03 NOTE — Patient Instructions (Signed)
Pt given injection on left side

## 2019-05-17 ENCOUNTER — Ambulatory Visit: Payer: BC Managed Care – PPO

## 2019-05-18 ENCOUNTER — Other Ambulatory Visit: Payer: Self-pay

## 2019-05-18 ENCOUNTER — Ambulatory Visit (INDEPENDENT_AMBULATORY_CARE_PROVIDER_SITE_OTHER): Payer: BC Managed Care – PPO | Admitting: Family Medicine

## 2019-05-18 DIAGNOSIS — E291 Testicular hypofunction: Secondary | ICD-10-CM | POA: Diagnosis not present

## 2019-05-18 NOTE — Patient Instructions (Signed)
° ° ° °  If you have lab work done today you will be contacted with your lab results within the next 2 weeks.  If you have not heard from us then please contact us. The fastest way to get your results is to register for My Chart. ° ° °IF you received an x-ray today, you will receive an invoice from [facility]. Please contact [facility] at [phone] with questions or concerns regarding your invoice.  ° °IF you received labwork today, you will receive an invoice from [facility]. Please contact [facility] at [phone] with questions or concerns regarding your invoice.  ° °Our billing staff will not be able to assist you with questions regarding bills from these companies. ° °You will be contacted with the lab results as soon as they are available. The fastest way to get your results is to activate your My Chart account. Instructions are located on the last page of this paperwork. If you have not heard from us regarding the results in 2 weeks, please contact this office. °  ° ° ° °

## 2019-05-25 ENCOUNTER — Ambulatory Visit: Payer: BC Managed Care – PPO | Attending: Family

## 2019-05-25 DIAGNOSIS — Z23 Encounter for immunization: Secondary | ICD-10-CM

## 2019-05-25 NOTE — Progress Notes (Signed)
   Covid-19 Vaccination Clinic  Name:  Marcus Gutierrez    MRN: [id number] DOB: 03-12-1960  05/25/2019  [name] was observed post Covid-19 immunization for 15 minutes without incident. He was provided with Vaccine Information Sheet and instruction to access the V-Safe system.   [name] was instructed to call 911 with any severe reactions post vaccine: Marland Kitchen Difficulty breathing  . Swelling of face and throat  . A fast heartbeat  . A bad rash all over body  . Dizziness and weakness   Immunizations Administered    Name Date Dose VIS Date Route   Moderna COVID-19 Vaccine 05/25/2019  4:04 PM 0.5 mL 01/12/2019 Intramuscular   Manufacturer: Moderna   Lot: [id number]   [place]: [id number]

## 2019-05-31 ENCOUNTER — Ambulatory Visit (INDEPENDENT_AMBULATORY_CARE_PROVIDER_SITE_OTHER): Payer: BC Managed Care – PPO | Admitting: Family Medicine

## 2019-05-31 ENCOUNTER — Other Ambulatory Visit: Payer: Self-pay

## 2019-05-31 DIAGNOSIS — E291 Testicular hypofunction: Secondary | ICD-10-CM | POA: Diagnosis not present

## 2019-05-31 NOTE — Progress Notes (Signed)
Pt received testosterone shot on LVG. Pt tolerated well.

## 2019-06-14 ENCOUNTER — Other Ambulatory Visit: Payer: Self-pay

## 2019-06-14 ENCOUNTER — Ambulatory Visit (INDEPENDENT_AMBULATORY_CARE_PROVIDER_SITE_OTHER): Payer: BC Managed Care – PPO | Admitting: Family Medicine

## 2019-06-14 DIAGNOSIS — E291 Testicular hypofunction: Secondary | ICD-10-CM

## 2019-06-28 ENCOUNTER — Other Ambulatory Visit: Payer: Self-pay

## 2019-06-28 ENCOUNTER — Telehealth: Payer: Self-pay | Admitting: Family Medicine

## 2019-06-28 ENCOUNTER — Ambulatory Visit (INDEPENDENT_AMBULATORY_CARE_PROVIDER_SITE_OTHER): Payer: BC Managed Care – PPO | Admitting: Family Medicine

## 2019-06-28 DIAGNOSIS — E291 Testicular hypofunction: Secondary | ICD-10-CM

## 2019-06-28 NOTE — Patient Instructions (Signed)
NEEDS TOC APPT NEEDS LABS : CBC, PSA, AND TESTOSTERONE

## 2019-06-28 NOTE — Telephone Encounter (Signed)
Please schedule labs for physical coming up 08/09/2019 /

## 2019-06-29 ENCOUNTER — Other Ambulatory Visit: Payer: Self-pay

## 2019-06-29 DIAGNOSIS — Z0000 Encounter for general adult medical examination without abnormal findings: Secondary | ICD-10-CM

## 2019-07-13 ENCOUNTER — Ambulatory Visit (INDEPENDENT_AMBULATORY_CARE_PROVIDER_SITE_OTHER): Payer: BC Managed Care – PPO | Admitting: Family Medicine

## 2019-07-13 ENCOUNTER — Other Ambulatory Visit: Payer: Self-pay

## 2019-07-13 DIAGNOSIS — E291 Testicular hypofunction: Secondary | ICD-10-CM

## 2019-07-13 NOTE — Progress Notes (Signed)
RUOQ for Testosterone shot.   Pt tolerated well.   Pt asked about the PA for these injections and there was no message in the chart about it. I informed pt that I would look into it.

## 2019-07-14 ENCOUNTER — Telehealth: Payer: Self-pay | Admitting: *Deleted

## 2019-07-14 NOTE — Telephone Encounter (Signed)
Key [id number]     Testoterone

## 2019-07-26 ENCOUNTER — Telehealth: Payer: Self-pay

## 2019-07-26 ENCOUNTER — Other Ambulatory Visit: Payer: Self-pay

## 2019-07-26 ENCOUNTER — Encounter: Payer: Self-pay | Admitting: Family Medicine

## 2019-07-26 ENCOUNTER — Telehealth: Payer: Self-pay | Admitting: *Deleted

## 2019-07-26 ENCOUNTER — Ambulatory Visit (INDEPENDENT_AMBULATORY_CARE_PROVIDER_SITE_OTHER): Payer: BC Managed Care – PPO | Admitting: Family Medicine

## 2019-07-26 VITALS — Ht 69.0 in

## 2019-07-26 DIAGNOSIS — E291 Testicular hypofunction: Secondary | ICD-10-CM

## 2019-07-26 NOTE — Telephone Encounter (Signed)
Pt came in for his testosterone injection today and mentioned that he has dropped off some lab results from [facility].   I have reviewed these labs that has been done on 07/09/19.  He has gotten  ~CMP with GFR ~CBC with Diff ~PSA ~Quantiferon gold.   The orders that we have active are  ~PSA ~Lipid ~TSH ~CMP w/ GFR ~CBC with Diff  Did you still want the pt to come in to get the labs that he did not have or are the labs that he did get enough to evaluate?  Please advise.

## 2019-07-26 NOTE — Telephone Encounter (Signed)
lipids ok for lab only visit. Can also check TSh for physical.

## 2019-07-26 NOTE — Telephone Encounter (Signed)
I have called pt back and informed him that we have seen his lab results and the only thing he will come in for is the Lipid and TSH and pt stated understanding.

## 2019-07-26 NOTE — Telephone Encounter (Signed)
I have attempted to called pt back and to inform him that we will still have to draw some labs on his nurse visit for his CPE/TOC.  The labs that were some labs that had not been drawn.   NO answer will try again later.

## 2019-07-26 NOTE — Progress Notes (Signed)
LUOQ 257m/1ml  Pt tolerated well.   [id number]:[id number] EXP:10/2021  Pt also had given me some lab that he has gotten last month from [facility] He would like to know if he still needs to the the lab work for his TOC/ CPE.   I have also informed him that his PA for the testosterone has been approved.

## 2019-07-26 NOTE — Telephone Encounter (Signed)
Pa approval for testosterone  Patient informed

## 2019-07-27 NOTE — Progress Notes (Signed)
Paper labs received.  Lipid panel planned plus TSH at lab only visit for physical.

## 2019-08-05 ENCOUNTER — Ambulatory Visit (INDEPENDENT_AMBULATORY_CARE_PROVIDER_SITE_OTHER): Payer: BC Managed Care – PPO | Admitting: Family Medicine

## 2019-08-05 ENCOUNTER — Other Ambulatory Visit: Payer: Self-pay

## 2019-08-05 DIAGNOSIS — Z0000 Encounter for general adult medical examination without abnormal findings: Secondary | ICD-10-CM

## 2019-08-06 ENCOUNTER — Encounter: Payer: BC Managed Care – PPO | Admitting: Family Medicine

## 2019-08-06 LAB — LIPID PANEL
Chol/HDL Ratio: 2 ratio (ref 0.0–5.0)
Cholesterol, Total: 168 mg/dL (ref 100–199)
HDL: 84 mg/dL (ref >39)
LDL Chol Calc (NIH): 73 mg/dL (ref 0–99)
Triglycerides: 52 mg/dL (ref 0–149)
VLDL Cholesterol Cal: 11 mg/dL (ref 5–40)

## 2019-08-06 LAB — TSH: TSH: 0.788 u[IU]/mL (ref 0.450–4.500)

## 2019-08-09 ENCOUNTER — Other Ambulatory Visit: Payer: Self-pay

## 2019-08-09 ENCOUNTER — Encounter: Payer: Self-pay | Admitting: Family Medicine

## 2019-08-09 ENCOUNTER — Ambulatory Visit (INDEPENDENT_AMBULATORY_CARE_PROVIDER_SITE_OTHER): Payer: BC Managed Care – PPO | Admitting: Family Medicine

## 2019-08-09 VITALS — BP 118/80 | HR 100 | Temp 98.8°F | Ht 69.0 in | Wt 158.0 lb

## 2019-08-09 DIAGNOSIS — E291 Testicular hypofunction: Secondary | ICD-10-CM

## 2019-08-09 DIAGNOSIS — K50119 Crohn's disease of large intestine with unspecified complications: Secondary | ICD-10-CM | POA: Diagnosis not present

## 2019-08-09 DIAGNOSIS — Z0001 Encounter for general adult medical examination with abnormal findings: Secondary | ICD-10-CM

## 2019-08-09 DIAGNOSIS — Z0000 Encounter for general adult medical examination without abnormal findings: Secondary | ICD-10-CM

## 2019-08-09 DIAGNOSIS — Z23 Encounter for immunization: Secondary | ICD-10-CM | POA: Diagnosis not present

## 2019-08-09 NOTE — Patient Instructions (Addendum)
Keeping you healthy  Get these tests  Blood pressure- Have your blood pressure checked once a year by your healthcare provider.  Normal blood pressure is 120/80  Weight- Have your body mass index (BMI) calculated to screen for obesity.  BMI is a measure of body fat based on height and weight. You can also calculate your own BMI at [email or web address].  Cholesterol- Have your cholesterol checked every year.  Diabetes- Have your blood sugar checked regularly if you have high blood pressure, high cholesterol, have a family history of diabetes or if you are overweight.  Screening for Colon Cancer- Colonoscopy starting at age 27.  Screening may begin sooner depending on your family history and other health conditions. Follow up colonoscopy as directed by your Gastroenterologist.  Screening for Prostate Cancer- Both blood work (PSA) and a rectal exam help screen for Prostate Cancer.  Screening begins at age 65 with African-American men and at age 44 with Caucasian men.  Screening may begin sooner depending on your family history.  Take these medicines  Aspirin- One aspirin daily can help prevent Heart disease and Stroke.  Flu shot- Every fall.  Tetanus- Every 10 years.  Zostavax- Once after the age of 24 to prevent Shingles.  Pneumonia shot- Once after the age of 64; if you are younger than 82, ask your healthcare provider if you need a Pneumonia shot.  Take these steps  Don't smoke- If you do smoke, talk to your doctor about quitting.  For tips on how to quit, go to www.smokefree.gov or call 1-800-QUIT-NOW.  Be physically active- Exercise 5 days a week for at least 30 minutes.  If you are not already physically active start slow and gradually work up to 30 minutes of moderate physical activity.  Examples of moderate activity include walking briskly, mowing the yard, dancing, swimming, bicycling, etc.  Eat a healthy diet- Eat a variety of healthy food such as fruits, vegetables,  low fat milk, low fat cheese, yogurt, lean meant, poultry, fish, beans, tofu, etc. For more information go to www.thenutritionsource.org  Drink alcohol in moderation- Limit alcohol intake to less than two drinks a day. Never drink and drive.  Dentist- Brush and floss twice daily; visit your dentist twice a year.  Depression- Your emotional health is as important as your physical health. If you're feeling down, or losing interest in things you would normally enjoy please talk to your healthcare provider.  Eye exam- Visit your eye doctor every year.  Safe sex- If you may be exposed to a sexually transmitted infection, use a condom.  Seat belts- Seat belts can save your life; always wear one.  Smoke/Carbon Monoxide detectors- These detectors need to be installed on the appropriate level of your home.  Replace batteries at least once a year.  Skin cancer- When out in the sun, cover up and use sunscreen 15 SPF or higher.  Violence- If anyone is threatening you, please tell your healthcare provider.  Living Will/ Health care power of attorney- Speak with your healthcare provider and family.   If you have lab work done today you will be contacted with your lab results within the next 2 weeks.  If you have not heard from [place] then please contact us. The fastest way to get your results is to register for My Chart.   IF you received an x-ray today, you will receive an invoice from [facility]. Please contact [facility] at [phone] with questions or concerns regarding your invoice.   IF  you received labwork today, you will receive an invoice from [facility]. Please contact [facility] at [phone] with questions or concerns regarding your invoice.   Our billing staff will not be able to assist you with questions regarding bills from these companies.  You will be contacted with the lab results as soon as they are available. The fastest way to get your results is to activate  your My Chart account. Instructions are located on the last page of this paperwork. If you have not heard from [place] regarding the results in 2 weeks, please contact this office.

## 2019-08-09 NOTE — Progress Notes (Signed)
Subjective:  Patient ID: Marcus Gutierrez, male    DOB: [birth year]  Age: 59 y.o. MRN: [id number]  CC:  Chief Complaint  Patient presents with  . Annual Exam    Pt reports he feels fine with no complaints.    HPI Marcus Gutierrez presents for  Annual physical exam.  Previous patient of Dr. Nolon Rod.  Hypogonadism: Receives testosterone injections, 200 mg IM every 2 weeks Last testosterone level of 667 on 04/14/18.  PSA, lipid panel, LFTs normal at that time.  CBC normal at that time with hemoglobin 13.4. Had labs done end of May by [facility], ordered by GI. approx 5/27 (10 days afyter injection. Last injection 07/26/19 prior to today.  No new side effects. No difficulty with erection with use of sildenafil. Rare use.   Labs reviewed from [facility] on May 28.  Glucose normal at 76.  LFTs okay with AST 13, ALT 5.  Hemoglobin normal at 14.5.  Testosterone 553, PSA 0.9.  Crohn's disease Followed by gastroenterology. Dr. Alan Ripper.  On Nexium, and Gaviscon. Starts Humira this Wednesday.  CT abdomen pelvis 01/26/2019 with mild enterocolitis involving the ileum cecum consistent with active Crohn's disease no evidence of abscess or other complication and tiny nonobstructive right renal calculus. Internal hemorrhoids noted in past with occasional bleeding.   Allergic rhinitis: Treated with Zyrtec, Flonase as needed. Working well.   Cancer screening: Colonoscopy 10/13/2011, repeat 10 years on our records - more frequent with GI, had one in 01/2019.  Dr. Alan Ripper.  Recent  [facility]. Labs as above.  Uncle with prostate CA.  Enlarged on exam in 2018 with Dr. Nolon Rod, no nodules.  Lab Results  Component Value Date   PSA1 1.2 04/14/2018   PSA1 2.4 07/03/2016   PSA 1.58 07/15/2015   PSA 1.67 02/13/2015   PSA 2.01 01/05/2014   Immunization History  Administered Date(s) Administered  . Influenza, Quadrivalent, Recombinant, Inj, Pf 11/25/2018  . Influenza,inj,Quad PF,6+ Mos 01/05/2013,  01/24/2014, 10/18/2014  . Moderna SARS-COVID-2 Vaccination 04/22/2019, 05/25/2019  . Pneumococcal Polysaccharide-23 04/05/2019  . Td 12/12/2006  . Tdap 04/14/2018  . Zoster Recombinat (Shingrix) 08/09/2019  requests shingles vaccine.   Depression screen [facility] 2/9 08/09/2019 12/17/2018 04/14/2018 03/17/2018 02/03/2017  Decreased Interest 0 0 0 0 0  Down, Depressed, Hopeless 0 0 0 0 0  PHQ - 2 Score 0 0 0 0 0    Hearing Screening   125Hz  250Hz  500Hz  1000Hz  2000Hz  3000Hz  4000Hz  6000Hz  8000Hz   Right ear:           Left ear:             Visual Acuity Screening   Right eye Left eye Both eyes  Without correction:     With correction: 20/20 20/20 20/20   optho - Dr. Katy Fitch with ongoing followup.  2 yr follow up.   Dental: Dr. Carlota Raspberry  - appt few months ago.  Dr. Owens Shark is periodontist -appt next month.   Exercise: Daily - walking. 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   STI testing:  Declines. Single, no new contacts or unprotected sex.   Lab Results  Component Value Date   CHOL 168 08/05/2019   HDL 84 08/05/2019   LDLCALC 73 08/05/2019   TRIG 52 08/05/2019   CHOLHDL 2.0 08/05/2019   Lab Results  Component Value Date   TSH 0.788 08/05/2019      history Patient Active Problem List   Diagnosis Date Noted  . Depression 03/05/2015  . Rising PSA level  02/13/2015  . Colitis 12/11/2011  . Hypogonadism male 11/29/2011   Past Medical History:  Diagnosis Date  . Allergy   . GERD (gastroesophageal reflux disease)   . Seizures ([place])   . Ulcer   . Ulcerative colitis [facility])    Past Surgical History:  Procedure Laterality Date  . HERNIA REPAIR     Allergies  Allergen Reactions  . Amoxicillin Other (See Comments)    hematochezia  . Androgel [Testosterone]    Prior to Admission medications   Medication Sig Start Date End Date Taking? Authorizing Provider  cetirizine (ZYRTEC) 10 MG tablet Take 1 tablet (10 mg total) by mouth daily. 12/17/18  Yes Rutherford Guys, MD  Cholecalciferol (VITAMIN  D3) 1000 UNITS CAPS Take 1 capsule by mouth daily.   Yes [provider]  esomeprazole (NEXIUM) 40 MG capsule Take 1 capsule (40 mg total) by mouth daily before breakfast. 03/22/11  Yes Daub, Loura Back, MD  fluticasone (FLONASE) 50 MCG/ACT nasal spray Place 1 spray into both nostrils 2 (two) times daily. 12/17/18  Yes Rutherford Guys, MD  HUMIRA PEN-CD/UC/HS STARTER 80 MG/0.8ML PNKT Inject into the skin. 07/29/19  Yes [provider]  PAZEO 0.7 % SOLN Place 1 drop into both eyes daily as needed. 07/03/16  Yes Stallings, Zoe A, MD  phenylephrine (SUDAFED PE) 10 MG TABS tablet Take 1 tablet (10 mg total) by mouth every 4 (four) hours as needed. 12/17/18  Yes Rutherford Guys, MD  testosterone cypionate (DEPOTESTOSTERONE CYPIONATE) 200 MG/ML injection INJECT 1 ML INTO THE MUSCLE EVERY 14 DAYS 03/12/19  Yes Forrest Moron, MD   Social History   Socioeconomic History  . Marital status: Married    Spouse name: Not on file  . Number of children: Not on file  . Years of education: Not on file  . Highest education level: Not on file  Occupational History  . Not on file  Tobacco Use  . Smoking status: Former Smoker    Quit date: 06/01/2003    Years since quitting: 16.2  . Smokeless tobacco: Never Used  Vaping Use  . Vaping Use: Never used  Substance and Sexual Activity  . Alcohol use: No    Alcohol/week: 0.0 standard drinks  . Drug use: No  . Sexual activity: Yes  Other Topics Concern  . Not on file  Social History Narrative  . Not on file   Social Determinants of Health   Financial Resource Strain:   . Difficulty of Paying Living Expenses:   Food Insecurity:   . Worried About Charity fundraiser in the Last Year:   . Arboriculturist in the Last Year:   Transportation Needs:   . Film/video editor (Medical):   Marland Kitchen Lack of Transportation (Non-Medical):   Physical Activity:   . Days of Exercise per Week:   . Minutes of Exercise per Session:   Stress:   . Feeling of  Stress :   Social Connections:   . Frequency of Communication with Friends and Family:   . Frequency of Social Gatherings with Friends and Family:   . Attends Religious Services:   . Active Member of Clubs or Organizations:   . Attends Archivist Meetings:   Marland Kitchen Marital Status:   Intimate Partner Violence:   . Fear of Current or Ex-Partner:   . Emotionally Abused:   Marland Kitchen Physically Abused:   . Sexually Abused:     Review of Systems 13 point review  of systems per patient health survey noted.  Negative other than as indicated above or in HPI.    Objective:   Vitals:   08/09/19 1323  BP: 118/80  Pulse: 100  Temp: 98.8 F (37.1 C)  TempSrc: Temporal  SpO2: 99%  Weight: 158 lb (71.7 kg)  Height: 5' 9"  (1.753 m)     Physical Exam Vitals reviewed.  Constitutional:      Appearance: He is well-developed.  HENT:     Head: Normocephalic and atraumatic.     Right Ear: External ear normal.     Left Ear: External ear normal.  Eyes:     Conjunctiva/sclera: Conjunctivae normal.     Pupils: Pupils are equal, round, and reactive to light.  Neck:     Thyroid: No thyromegaly.  Cardiovascular:     Rate and Rhythm: Normal rate and regular rhythm.     Heart sounds: Normal heart sounds.  Pulmonary:     Effort: Pulmonary effort is normal. No respiratory distress.     Breath sounds: Normal breath sounds. No wheezing.  Abdominal:     General: There is no distension.     Palpations: Abdomen is soft.     Tenderness: There is no abdominal tenderness.     Hernia: There is no hernia in the left inguinal area or right inguinal area.  Genitourinary:    Prostate: Enlarged (enlarged, no nodule, nontender. ). Not tender and no nodules present.  Musculoskeletal:        General: No tenderness. Normal range of motion.     Cervical back: Normal range of motion and neck supple.  Lymphadenopathy:     Cervical: No cervical adenopathy.  Skin:    General: Skin is warm and dry.    Neurological:     Mental Status: He is alert and oriented to person, place, and time.     Deep Tendon Reflexes: Reflexes are normal and symmetric.  Psychiatric:        Behavior: Behavior normal.        Assessment & Plan:  CHARVIS LIGHTNER is a 59 y.o. male . Annual physical exam  - .-anticipatory guidance as below in AVS, screening labs above. Health maintenance items as above in HPI discussed/recommended as applicable.   Crohn's disease of colon with complication [facility])  - followed by gastroenterology with planned start of Humira.   Hypogonadism male  - outside labs noted. No concerns. Continue same doses.   Need for shingles vaccine - Plan: Varicella-zoster vaccine IM (Shingrix)  - shingrix #1 given.   No orders of the defined types were placed in this encounter.  Patient Instructions    Keeping you healthy  Get these tests  Blood pressure- Have your blood pressure checked once a year by your healthcare provider.  Normal blood pressure is 120/80  Weight- Have your body mass index (BMI) calculated to screen for obesity.  BMI is a measure of body fat based on height and weight. You can also calculate your own BMI at ViewBanking.[email or web address].  Cholesterol- Have your cholesterol checked every year.  Diabetes- Have your blood sugar checked regularly if you have high blood pressure, high cholesterol, have a family history of diabetes or if you are overweight.  Screening for Colon Cancer- Colonoscopy starting at age 13.  Screening may begin sooner depending on your family history and other health conditions. Follow up colonoscopy as directed by your Gastroenterologist.  Screening for Prostate Cancer- Both blood work (PSA) and a rectal exam help  screen for Prostate Cancer.  Screening begins at age 51 with African-American men and at age 88 with Caucasian men.  Screening may begin sooner depending on your family history.  Take these medicines  Aspirin- One aspirin daily can  help prevent Heart disease and Stroke.  Flu shot- Every fall.  Tetanus- Every 10 years.  Zostavax- Once after the age of 84 to prevent Shingles.  Pneumonia shot- Once after the age of 63; if you are younger than 3, ask your healthcare provider if you need a Pneumonia shot.  Take these steps  Don't smoke- If you do smoke, talk to your doctor about quitting.  For tips on how to quit, go to www.smokefree.gov or call 1-800-QUIT-NOW.  Be physically active- Exercise 5 days a week for at least 30 minutes.  If you are not already physically active start slow and gradually work up to 30 minutes of moderate physical activity.  Examples of moderate activity include walking briskly, mowing the yard, dancing, swimming, bicycling, etc.  Eat a healthy diet- Eat a variety of healthy food such as fruits, vegetables, low fat milk, low fat cheese, yogurt, lean meant, poultry, fish, beans, tofu, etc. For more information go to www.thenutritionsource.org  Drink alcohol in moderation- Limit alcohol intake to less than two drinks a day. Never drink and drive.  Dentist- Brush and floss twice daily; visit your dentist twice a year.  Depression- Your emotional health is as important as your physical health. If you're feeling down, or losing interest in things you would normally enjoy please talk to your healthcare provider.  Eye exam- Visit your eye doctor every year.  Safe sex- If you may be exposed to a sexually transmitted infection, use a condom.  Seat belts- Seat belts can save your life; always wear one.  Smoke/Carbon Monoxide detectors- These detectors need to be installed on the appropriate level of your home.  Replace batteries at least once a year.  Skin cancer- When out in the sun, cover up and use sunscreen 15 SPF or higher.  Violence- If anyone is threatening you, please tell your healthcare provider.  Living Will/ Health care power of attorney- Speak with your healthcare provider and  family.   If you have lab work done today you will be contacted with your lab results within the next 2 weeks.  If you have not heard from [place] then please contact us. The fastest way to get your results is to register for My Chart.   IF you received an x-ray today, you will receive an invoice from [facility]. Please contact [facility] at [phone] with questions or concerns regarding your invoice.   IF you received labwork today, you will receive an invoice from [facility]. Please contact [facility] at [phone] with questions or concerns regarding your invoice.   Our billing staff will not be able to assist you with questions regarding bills from these companies.  You will be contacted with the lab results as soon as they are available. The fastest way to get your results is to activate your My Chart account. Instructions are located on the last page of this paperwork. If you have not heard from [place] regarding the results in 2 weeks, please contact this office.         Signed, Merri Ray, MD [facility]

## 2019-08-23 ENCOUNTER — Ambulatory Visit (INDEPENDENT_AMBULATORY_CARE_PROVIDER_SITE_OTHER): Payer: BC Managed Care – PPO | Admitting: Family Medicine

## 2019-08-23 ENCOUNTER — Other Ambulatory Visit: Payer: Self-pay

## 2019-08-23 DIAGNOSIS — E291 Testicular hypofunction: Secondary | ICD-10-CM | POA: Diagnosis not present

## 2019-08-23 NOTE — Progress Notes (Signed)
Marcus Gutierrez was started a couple of weeks ago on Wednesday.   Pt got injection on LUOQ and tolerated well

## 2019-09-06 ENCOUNTER — Ambulatory Visit: Payer: BC Managed Care – PPO

## 2019-09-07 ENCOUNTER — Ambulatory Visit: Payer: BC Managed Care – PPO

## 2019-09-08 ENCOUNTER — Ambulatory Visit (INDEPENDENT_AMBULATORY_CARE_PROVIDER_SITE_OTHER): Payer: BC Managed Care – PPO | Admitting: Family Medicine

## 2019-09-08 ENCOUNTER — Other Ambulatory Visit: Payer: Self-pay

## 2019-09-08 DIAGNOSIS — E291 Testicular hypofunction: Secondary | ICD-10-CM

## 2019-09-08 NOTE — Patient Instructions (Signed)
° ° ° °  If you have lab work done today you will be contacted with your lab results within the next 2 weeks.  If you have not heard from us then please contact us. The fastest way to get your results is to register for My Chart. ° ° °IF you received an x-ray today, you will receive an invoice from [facility]. Please contact [facility] at [phone] with questions or concerns regarding your invoice.  ° °IF you received labwork today, you will receive an invoice from [facility]. Please contact [facility] at [phone] with questions or concerns regarding your invoice.  ° °Our billing staff will not be able to assist you with questions regarding bills from these companies. ° °You will be contacted with the lab results as soon as they are available. The fastest way to get your results is to activate your My Chart account. Instructions are located on the last page of this paperwork. If you have not heard from us regarding the results in 2 weeks, please contact this office. °  ° ° ° °

## 2019-09-20 ENCOUNTER — Other Ambulatory Visit: Payer: Self-pay

## 2019-09-20 ENCOUNTER — Ambulatory Visit (INDEPENDENT_AMBULATORY_CARE_PROVIDER_SITE_OTHER): Payer: BC Managed Care – PPO | Admitting: Family Medicine

## 2019-09-20 DIAGNOSIS — E291 Testicular hypofunction: Secondary | ICD-10-CM | POA: Diagnosis not present

## 2019-09-20 NOTE — Patient Instructions (Addendum)
  Right Upper Outer Quadrant. Pt tolerated well.   UDT:[id number] JKQ:[id number] EXP:02/2022   If you have lab work done today you will be contacted with your lab results within the next 2 weeks.  If you have not heard from [place] then please contact us. The fastest way to get your results is to register for My Chart.   IF you received an x-ray today, you will receive an invoice from [facility]. Please contact [facility] at [phone] with questions or concerns regarding your invoice.   IF you received labwork today, you will receive an invoice from [facility]. Please contact [facility] at [phone] with questions or concerns regarding your invoice.   Our billing staff will not be able to assist you with questions regarding bills from these companies.  You will be contacted with the lab results as soon as they are available. The fastest way to get your results is to activate your My Chart account. Instructions are located on the last page of this paperwork. If you have not heard from [place] regarding the results in 2 weeks, please contact this office.

## 2019-10-04 ENCOUNTER — Ambulatory Visit (INDEPENDENT_AMBULATORY_CARE_PROVIDER_SITE_OTHER): Payer: BC Managed Care – PPO | Admitting: Family Medicine

## 2019-10-04 ENCOUNTER — Other Ambulatory Visit: Payer: Self-pay

## 2019-10-04 DIAGNOSIS — E291 Testicular hypofunction: Secondary | ICD-10-CM

## 2019-10-04 NOTE — Patient Instructions (Signed)
° ° ° °  If you have lab work done today you will be contacted with your lab results within the next 2 weeks.  If you have not heard from us then please contact us. The fastest way to get your results is to register for My Chart. ° ° °IF you received an x-ray today, you will receive an invoice from [facility]. Please contact [facility] at [phone] with questions or concerns regarding your invoice.  ° °IF you received labwork today, you will receive an invoice from [facility]. Please contact [facility] at [phone] with questions or concerns regarding your invoice.  ° °Our billing staff will not be able to assist you with questions regarding bills from these companies. ° °You will be contacted with the lab results as soon as they are available. The fastest way to get your results is to activate your My Chart account. Instructions are located on the last page of this paperwork. If you have not heard from us regarding the results in 2 weeks, please contact this office. °  ° ° ° °

## 2019-10-19 ENCOUNTER — Other Ambulatory Visit: Payer: Self-pay

## 2019-10-19 ENCOUNTER — Ambulatory Visit (INDEPENDENT_AMBULATORY_CARE_PROVIDER_SITE_OTHER): Payer: BC Managed Care – PPO | Admitting: Registered Nurse

## 2019-10-19 DIAGNOSIS — E291 Testicular hypofunction: Secondary | ICD-10-CM

## 2019-10-19 NOTE — Patient Instructions (Signed)
° ° ° °  If you have lab work done today you will be contacted with your lab results within the next 2 weeks.  If you have not heard from us then please contact us. The fastest way to get your results is to register for My Chart. ° ° °IF you received an x-ray today, you will receive an invoice from [facility]. Please contact [facility] at [phone] with questions or concerns regarding your invoice.  ° °IF you received labwork today, you will receive an invoice from [facility]. Please contact [facility] at [phone] with questions or concerns regarding your invoice.  ° °Our billing staff will not be able to assist you with questions regarding bills from these companies. ° °You will be contacted with the lab results as soon as they are available. The fastest way to get your results is to activate your My Chart account. Instructions are located on the last page of this paperwork. If you have not heard from us regarding the results in 2 weeks, please contact this office. °  ° ° ° °

## 2019-11-01 ENCOUNTER — Ambulatory Visit (INDEPENDENT_AMBULATORY_CARE_PROVIDER_SITE_OTHER): Payer: BC Managed Care – PPO | Admitting: Family Medicine

## 2019-11-01 ENCOUNTER — Other Ambulatory Visit: Payer: Self-pay

## 2019-11-01 DIAGNOSIS — E291 Testicular hypofunction: Secondary | ICD-10-CM

## 2019-11-01 NOTE — Telephone Encounter (Signed)
Pt came in today to get injection and he stated that he only has one left. He will need a refill.   Patient is requesting a refill of the following medications: Requested Prescriptions   Pending Prescriptions Disp Refills  . testosterone cypionate (DEPOTESTOSTERONE CYPIONATE) 200 MG/ML injection 6 mL 5    Sig: Pt is to bring in 1 ml vial to office to get injection every two weeks    Date of patient request: 11/01/19 Last office visit: 08/09/19 Date of last refill: 03/12/19 Last refill amount: 6 ml with 5 refill for (1 ml per vial) Follow up time period per chart: 02/07/20

## 2019-11-01 NOTE — Progress Notes (Signed)
Pt tolerated well on R ventrogluteal

## 2019-11-02 MED ORDER — TESTOSTERONE CYPIONATE 200 MG/ML IM SOLN: INTRAMUSCULAR | 5 refills | Status: DC

## 2019-11-15 ENCOUNTER — Ambulatory Visit (INDEPENDENT_AMBULATORY_CARE_PROVIDER_SITE_OTHER): Payer: BC Managed Care – PPO | Admitting: Family Medicine

## 2019-11-15 ENCOUNTER — Other Ambulatory Visit: Payer: Self-pay

## 2019-11-15 DIAGNOSIS — E291 Testicular hypofunction: Secondary | ICD-10-CM

## 2019-11-15 MED ORDER — TESTOSTERONE CYPIONATE 200 MG/ML IM SOLN: INTRAMUSCULAR | 5 refills | Status: DC

## 2019-11-15 NOTE — Patient Instructions (Signed)
° ° ° °  If you have lab work done today you will be contacted with your lab results within the next 2 weeks.  If you have not heard from us then please contact us. The fastest way to get your results is to register for My Chart. ° ° °IF you received an x-ray today, you will receive an invoice from [facility]. Please contact [facility] at [phone] with questions or concerns regarding your invoice.  ° °IF you received labwork today, you will receive an invoice from [facility]. Please contact [facility] at [phone] with questions or concerns regarding your invoice.  ° °Our billing staff will not be able to assist you with questions regarding bills from these companies. ° °You will be contacted with the lab results as soon as they are available. The fastest way to get your results is to activate your My Chart account. Instructions are located on the last page of this paperwork. If you have not heard from us regarding the results in 2 weeks, please contact this office. °  ° ° ° °

## 2019-11-15 NOTE — Progress Notes (Signed)
Pt tolerated well, Lt Ventrogluteal

## 2019-11-29 ENCOUNTER — Other Ambulatory Visit: Payer: Self-pay

## 2019-11-29 ENCOUNTER — Ambulatory Visit (INDEPENDENT_AMBULATORY_CARE_PROVIDER_SITE_OTHER): Payer: BC Managed Care – PPO | Admitting: Family Medicine

## 2019-11-29 DIAGNOSIS — E291 Testicular hypofunction: Secondary | ICD-10-CM | POA: Diagnosis not present

## 2019-11-29 MED ORDER — TESTOSTERONE CYPIONATE 200 MG/ML IM SOLN
200.0000 mg | INTRAMUSCULAR | Status: DC
  Administered 2019-11-29 – 2019-12-13 (×2): 200 mg via INTRAMUSCULAR

## 2019-11-29 NOTE — Patient Instructions (Signed)
° ° ° °  If you have lab work done today you will be contacted with your lab results within the next 2 weeks.  If you have not heard from us then please contact us. The fastest way to get your results is to register for My Chart. ° ° °IF you received an x-ray today, you will receive an invoice from [facility]. Please contact [facility] at [phone] with questions or concerns regarding your invoice.  ° °IF you received labwork today, you will receive an invoice from [facility]. Please contact [facility] at [phone] with questions or concerns regarding your invoice.  ° °Our billing staff will not be able to assist you with questions regarding bills from these companies. ° °You will be contacted with the lab results as soon as they are available. The fastest way to get your results is to activate your My Chart account. Instructions are located on the last page of this paperwork. If you have not heard from us regarding the results in 2 weeks, please contact this office. °  ° ° ° °

## 2019-12-13 ENCOUNTER — Ambulatory Visit (INDEPENDENT_AMBULATORY_CARE_PROVIDER_SITE_OTHER): Payer: BC Managed Care – PPO | Admitting: Family Medicine

## 2019-12-13 ENCOUNTER — Other Ambulatory Visit: Payer: Self-pay

## 2019-12-13 DIAGNOSIS — E291 Testicular hypofunction: Secondary | ICD-10-CM | POA: Diagnosis not present

## 2019-12-27 ENCOUNTER — Other Ambulatory Visit: Payer: Self-pay

## 2019-12-27 ENCOUNTER — Encounter: Payer: Self-pay | Admitting: Family Medicine

## 2019-12-27 ENCOUNTER — Ambulatory Visit (INDEPENDENT_AMBULATORY_CARE_PROVIDER_SITE_OTHER): Payer: BC Managed Care – PPO | Admitting: Family Medicine

## 2019-12-27 DIAGNOSIS — E291 Testicular hypofunction: Secondary | ICD-10-CM

## 2019-12-27 NOTE — Progress Notes (Signed)
Nurse visit only.

## 2020-01-10 ENCOUNTER — Ambulatory Visit: Payer: BC Managed Care – PPO

## 2020-01-21 ENCOUNTER — Ambulatory Visit: Payer: BC Managed Care – PPO

## 2020-01-24 ENCOUNTER — Other Ambulatory Visit: Payer: Self-pay

## 2020-01-24 ENCOUNTER — Ambulatory Visit (INDEPENDENT_AMBULATORY_CARE_PROVIDER_SITE_OTHER): Payer: BC Managed Care – PPO | Admitting: Family Medicine

## 2020-01-24 DIAGNOSIS — E291 Testicular hypofunction: Secondary | ICD-10-CM | POA: Diagnosis not present

## 2020-01-24 NOTE — Telephone Encounter (Signed)
Pt came to office to get his testoterone injection. It was given on left side today.  He stated that he only has one bottle left which is good for one more does which is around Dec 27th.   Pt needs refill on the rx. It has been pended for provider review.

## 2020-01-24 NOTE — Progress Notes (Signed)
PT tolerated well  Left Upper Outer Quad.

## 2020-01-25 MED ORDER — TESTOSTERONE CYPIONATE 200 MG/ML IM SOLN: INTRAMUSCULAR | 5 refills | Status: DC

## 2020-01-25 NOTE — Telephone Encounter (Signed)
Medication last discussed in June and had reviewed lab work from the end of May regarding testosterone and other monitoring tests.  Due for repeat testing every 6 months.  I will extend his prescription but please schedule appointment with me 4 to 6 weeks if possible.  It looks like he was scheduled to see me on the 27th, and we have contacted him to reschedule appointment to December 29th.  If he can keep that appointment, can repeat labs at that time.  Electronically prescribed - shred printed Rx.

## 2020-02-07 ENCOUNTER — Other Ambulatory Visit: Payer: Self-pay

## 2020-02-07 ENCOUNTER — Ambulatory Visit: Payer: BC Managed Care – PPO | Admitting: Family Medicine

## 2020-02-07 ENCOUNTER — Ambulatory Visit (INDEPENDENT_AMBULATORY_CARE_PROVIDER_SITE_OTHER): Payer: BC Managed Care – PPO | Admitting: Registered Nurse

## 2020-02-07 DIAGNOSIS — E291 Testicular hypofunction: Secondary | ICD-10-CM

## 2020-02-07 NOTE — Patient Instructions (Signed)
° ° ° °  If you have lab work done today you will be contacted with your lab results within the next 2 weeks.  If you have not heard from us then please contact us. The fastest way to get your results is to register for My Chart. ° ° °IF you received an x-ray today, you will receive an invoice from [facility]. Please contact [facility] at [phone] with questions or concerns regarding your invoice.  ° °IF you received labwork today, you will receive an invoice from [facility]. Please contact [facility] at [phone] with questions or concerns regarding your invoice.  ° °Our billing staff will not be able to assist you with questions regarding bills from these companies. ° °You will be contacted with the lab results as soon as they are available. The fastest way to get your results is to activate your My Chart account. Instructions are located on the last page of this paperwork. If you have not heard from us regarding the results in 2 weeks, please contact this office. °  ° ° ° °

## 2020-02-07 NOTE — Progress Notes (Signed)
Nurse visit  Right upper outer quadrant  injection 200 mg/ml Testosterone pt supplied

## 2020-02-09 ENCOUNTER — Ambulatory Visit: Payer: BC Managed Care – PPO

## 2020-02-09 ENCOUNTER — Ambulatory Visit: Payer: BC Managed Care – PPO | Admitting: Family Medicine

## 2020-02-10 ENCOUNTER — Ambulatory Visit (INDEPENDENT_AMBULATORY_CARE_PROVIDER_SITE_OTHER): Payer: BC Managed Care – PPO | Admitting: Family Medicine

## 2020-02-10 ENCOUNTER — Other Ambulatory Visit: Payer: Self-pay

## 2020-02-10 DIAGNOSIS — Z23 Encounter for immunization: Secondary | ICD-10-CM | POA: Diagnosis not present

## 2020-02-21 ENCOUNTER — Ambulatory Visit (INDEPENDENT_AMBULATORY_CARE_PROVIDER_SITE_OTHER): Payer: BC Managed Care – PPO | Admitting: Family Medicine

## 2020-02-21 ENCOUNTER — Other Ambulatory Visit: Payer: Self-pay

## 2020-02-21 DIAGNOSIS — E291 Testicular hypofunction: Secondary | ICD-10-CM

## 2020-02-21 MED ORDER — TESTOSTERONE CYPIONATE 200 MG/ML IM SOLN
200.0000 mg | INTRAMUSCULAR | Status: DC
  Administered 2020-02-21 – 2020-07-25 (×11): 200 mg via INTRAMUSCULAR

## 2020-02-21 NOTE — Progress Notes (Signed)
Left upper outer quadrant  injection 200 mg/ml Testosterone  pt supplied

## 2020-02-24 ENCOUNTER — Other Ambulatory Visit: Payer: BC Managed Care – PPO

## 2020-02-24 DIAGNOSIS — Z20822 Contact with and (suspected) exposure to covid-19: Secondary | ICD-10-CM

## 2020-02-26 LAB — NOVEL CORONAVIRUS, NAA: SARS-CoV-2, NAA: NOT DETECTED

## 2020-02-26 LAB — SARS-COV-2, NAA 2 DAY TAT

## 2020-03-02 ENCOUNTER — Ambulatory Visit: Payer: BC Managed Care – PPO | Admitting: Family Medicine

## 2020-03-02 ENCOUNTER — Encounter: Payer: Self-pay | Admitting: Family Medicine

## 2020-03-02 ENCOUNTER — Other Ambulatory Visit: Payer: Self-pay

## 2020-03-02 VITALS — BP 151/102 | HR 100 | Temp 99.2°F | Ht 69.0 in | Wt 162.0 lb

## 2020-03-02 DIAGNOSIS — E291 Testicular hypofunction: Secondary | ICD-10-CM | POA: Diagnosis not present

## 2020-03-02 DIAGNOSIS — Z23 Encounter for immunization: Secondary | ICD-10-CM | POA: Diagnosis not present

## 2020-03-02 DIAGNOSIS — Z5181 Encounter for therapeutic drug level monitoring: Secondary | ICD-10-CM

## 2020-03-02 DIAGNOSIS — M722 Plantar fascial fibromatosis: Secondary | ICD-10-CM

## 2020-03-02 DIAGNOSIS — K50119 Crohn's disease of large intestine with unspecified complications: Secondary | ICD-10-CM | POA: Diagnosis not present

## 2020-03-02 DIAGNOSIS — N529 Male erectile dysfunction, unspecified: Secondary | ICD-10-CM | POA: Diagnosis not present

## 2020-03-02 NOTE — Patient Instructions (Addendum)
No change in meds today. Fasting lab visit on 03/13/20.  6 month follow up.   See info on plantar fasciitis. If not continuing to improve, return for recheck.   Thanks for coming in today.    Plantar Fasciitis  Plantar fasciitis is a painful foot condition that affects the heel. It occurs when the band of tissue that connects the toes to the heel bone (plantar fascia) becomes irritated. This can happen as the result of exercising too much or doing other repetitive activities (overuse injury). Plantar fasciitis can cause mild irritation to severe pain that makes it difficult to walk or move. The pain is usually worse in the morning after sleeping, or after sitting or lying down for a period of time. Pain may also be worse after long periods of walking or standing. What are the causes? This condition may be caused by:  Standing for long periods of time.  Wearing shoes that do not have good arch support.  Doing activities that put stress on joints (high-impact activities). This includes ballet and exercise that makes your heart beat faster (aerobic exercise), such as running.  Being overweight.  An abnormal way of walking (gait).  Tight muscles in the back of your lower leg (calf).  High arches in your feet or flat feet.  Starting a new athletic activity. What are the signs or symptoms? The main symptom of this condition is heel pain. Pain may get worse after the following:  Taking the first steps after a time of rest, especially in the morning after awakening, or after you have been sitting or lying down for a while.  Long periods of standing still. Pain may decrease after 30-45 minutes of activity, such as gentle walking. How is this diagnosed? This condition may be diagnosed based on your medical history, a physical exam, and your symptoms. Your health care provider will check for:  A tender area on the bottom of your foot.  A high arch in your foot or flat feet.  Pain when  you move your foot.  Difficulty moving your foot. You may have imaging tests to confirm the diagnosis, such as:  X-rays.  Ultrasound.  MRI. How is this treated? Treatment for plantar fasciitis depends on how severe your condition is. Treatment may include:  Rest, ice, pressure (compression), and raising (elevating) the affected foot. This is called RICE therapy. Your health care provider may recommend RICE therapy along with over-the-counter pain medicines to manage your pain.  Exercises to stretch your calves and your plantar fascia.  A splint that holds your foot in a stretched, upward position while you sleep (night splint).  Physical therapy to relieve symptoms and prevent problems in the future.  Injections of steroid medicine (cortisone) to relieve pain and inflammation.  Stimulating your plantar fascia with electrical impulses (extracorporeal shock wave therapy). This is usually the last treatment option before surgery.  Surgery, if other treatments have not worked after 12 months. Follow these instructions at home: Managing pain, stiffness, and swelling  If directed, put ice on the painful area. To do this: ? Put ice in a plastic bag, or use a frozen bottle of water. ? Place a towel between your skin and the bag or bottle. ? Roll the bottom of your foot over the bag or bottle. ? Do this for 20 minutes, 2-3 times a day.  Wear athletic shoes that have air-sole or gel-sole cushions, or try soft shoe inserts that are designed for plantar fasciitis.  Elevate  your foot above the level of your heart while you are sitting or lying down.   Activity  Avoid activities that cause pain. Ask your health care provider what activities are safe for you.  Do physical therapy exercises and stretches as told by your health care provider.  Try activities and forms of exercise that are easier on your joints (low impact). Examples include swimming, water aerobics, and biking. General  instructions  Take over-the-counter and prescription medicines only as told by your health care provider.  Wear a night splint while sleeping, if told by your health care provider. Loosen the splint if your toes tingle, become numb, or turn cold and blue.  Maintain a healthy weight, or work with your health care provider to lose weight as needed.  Keep all follow-up visits. This is important. Contact a health care provider if you have:  Symptoms that do not go away with home treatment.  Pain that gets worse.  Pain that affects your ability to move or do daily activities. Summary  Plantar fasciitis is a painful foot condition that affects the heel. It occurs when the band of tissue that connects the toes to the heel bone (plantar fascia) becomes irritated.  Heel pain is the main symptom of this condition. It may get worse after exercising too much or standing still for a long time.  Treatment varies, but it usually starts with rest, ice, pressure (compression), and raising (elevating) the affected foot. This is called RICE therapy. Over-the-counter medicines can also be used to manage pain. This information is not intended to replace advice given to you by your health care provider. Make sure you discuss any questions you have with your health care provider. Document Revised: 05/17/2019 Document Reviewed: 05/17/2019 Elsevier Patient Education  2021 [employer].   If you have lab work done today you will be contacted with your lab results within the next 2 weeks.  If you have not heard from [place] then please contact us. The fastest way to get your results is to register for My Chart.   IF you received an x-ray today, you will receive an invoice from [facility]. Please contact [facility] at [phone] with questions or concerns regarding your invoice.   IF you received labwork today, you will receive an invoice from [place]. Please contact [facility] at [phone]  with questions or concerns regarding your invoice.   Our billing staff will not be able to assist you with questions regarding bills from these companies.  You will be contacted with the lab results as soon as they are available. The fastest way to get your results is to activate your My Chart account. Instructions are located on the last page of this paperwork. If you have not heard from [place] regarding the results in 2 weeks, please contact this office.

## 2020-03-02 NOTE — Progress Notes (Signed)
Subjective:  Patient ID: Marcus Gutierrez, male    DOB: 12-16-1960  Age: 60 y.o. MRN: [id number]  CC:  Chief Complaint  Patient presents with  . Follow-up    On Hypogonadism and Crohn's disease.    HPI Marcus Gutierrez presents for   Hypogonadism Testosterone injections 200 mg every 2 weeks (last given 02/21/20).  Tolerating meds when last discussed in June.  Reviewed outside labs that had been ordered by gastroenterology in May at that time.  Hemoglobin 13.4, PSA, lipid panel, LFTs normal at that time.  CBC was normal at that time.  Testosterone 667 in March 2020.  Sildenafil has been used for erectile dysfunction without any side effects.  Sildenafil rx by Dr. Alvester Morin, urology- working well. Rarely using, not needed every time.  Side effects discussed (including but not limited to headache/flushing, blue discoloration of vision, possible vascular steal and risk of cardiac effects if underlying unknown coronary artery disease, and permanent sensorineural hearing loss). Understanding expressed.  No vision or hearing changes. No CP with exertion.   Feels like current dose of testosterone working well, no new side effects.   BP Readings from Last 3 Encounters:  03/02/20 (!) 151/102  08/09/19 118/80  04/14/18 110/71  stressful meeting prior to today's visit.    Crohn's disease Gastroenterology Dr. Cloretta Ned - she is with [facility] On Humira. Has had some improvement.  Treated with omeprazole, Gaviscon, has remained on Humira.  Previous internal hemorrhoids. No recent flare.   Foot pain past 4-6 weeks.  Plantar fasciitis.  Tried insole in shoe that has helped, better today.   Flu vaccine today.  Had covid vaccine and booster. 2nd booster tomorrow (4th vaccine).    History Patient Active Problem List   Diagnosis Date Noted  . Depression 03/05/2015  . Rising PSA level 02/13/2015  . Colitis 12/11/2011  . Hypogonadism male 11/29/2011   Past Medical History:  Diagnosis  Date  . Allergy   . GERD (gastroesophageal reflux disease)   . Seizures (HCC)   . Ulcer   . Ulcerative colitis [facility])    Past Surgical History:  Procedure Laterality Date  . HERNIA REPAIR     Allergies  Allergen Reactions  . Amoxicillin Other (See Comments)    hematochezia  . Androgel [Testosterone]    Prior to Admission medications   Medication Sig Start Date End Date Taking? Authorizing Provider  cetirizine (ZYRTEC) 10 MG tablet Take 1 tablet (10 mg total) by mouth daily. 12/17/18   Noni Saupe, MD  Cholecalciferol (VITAMIN D3) 1000 UNITS CAPS Take 1 capsule by mouth daily.    [provider]  esomeprazole (NEXIUM) 40 MG capsule Take 1 capsule (40 mg total) by mouth daily before breakfast. 03/22/11   Daub, Maylon Peppers, MD  fluticasone (FLONASE) 50 MCG/ACT nasal spray Place 1 spray into both nostrils 2 (two) times daily. 12/17/18   Noni Saupe, MD  HUMIRA PEN-CD/UC/HS STARTER 80 MG/0.8ML PNKT Inject into the skin. 07/29/19   [provider]  PAZEO 0.7 % SOLN Place 1 drop into both eyes daily as needed. 07/03/16   Doristine Bosworth, MD  phenylephrine (SUDAFED PE) 10 MG TABS tablet Take 1 tablet (10 mg total) by mouth every 4 (four) hours as needed. 12/17/18   Noni Saupe, MD  testosterone cypionate (DEPOTESTOSTERONE CYPIONATE) 200 MG/ML injection Pt is to bring in 1 ml vial to office to get injection every two weeks 01/25/20   Neva Seat,  Asencion Partridge, MD   Social History   Socioeconomic History  . Marital status: Married    Spouse name: Not on file  . Number of children: Not on file  . Years of education: Not on file  . Highest education level: Not on file  Occupational History  . Not on file  Tobacco Use  . Smoking status: Former Smoker    Quit date: 06/01/2003    Years since quitting: 16.7  . Smokeless tobacco: Never Used  Vaping Use  . Vaping Use: Never used  Substance and Sexual Activity  . Alcohol use: No    Alcohol/week: 0.0 standard  drinks  . Drug use: No  . Sexual activity: Yes  Other Topics Concern  . Not on file  Social History Narrative  . Not on file   Social Determinants of Health   Financial Resource Strain: Not on file  Food Insecurity: Not on file  Transportation Needs: Not on file  Physical Activity: Not on file  Stress: Not on file  Social Connections: Not on file  Intimate Partner Violence: Not on file    Review of Systems  Per HPI.  Objective:   Vitals:   03/02/20 1426  BP: (!) 151/102  Pulse: 100  Temp: 99.2 F (37.3 C)  TempSrc: Temporal  SpO2: 98%  Weight: 162 lb (73.5 kg)  Height: 5\' 9"  (1.753 m)     Physical Exam Vitals reviewed.  Constitutional:      Appearance: He is well-developed and well-nourished.  HENT:     Head: Normocephalic and atraumatic.  Eyes:     Extraocular Movements: EOM normal.     Pupils: Pupils are equal, round, and reactive to light.  Neck:     Vascular: No carotid bruit or JVD.  Cardiovascular:     Rate and Rhythm: Normal rate and regular rhythm.     Heart sounds: Normal heart sounds. No murmur heard.   Pulmonary:     Effort: Pulmonary effort is normal.     Breath sounds: Normal breath sounds. No rales.  Musculoskeletal:        General: No edema.     Comments: Tender to palpation at bilateral plantar heels, plantar fascial origin.  Negative lateral heel squeeze.  No soft tissue swelling.  No focal bony tenderness.  Skin:    General: Skin is warm and dry.  Neurological:     Mental Status: He is alert and oriented to person, place, and time.  Psychiatric:        Mood and Affect: Mood and affect normal.        Assessment & Plan:  Marcus Gutierrez is a 60 y.o. male . Hypogonadism in male - Plan: Lipid panel, Comprehensive metabolic panel, Testosterone, PSA, CBC Encounter for medication monitoring - Plan: Lipid panel, Comprehensive metabolic panel, Testosterone, PSA, CBC  -Continue same regimen, monitoring labs above.  Testosterone and  other labs will be checked on Monday the 31st which is midway between his injections.  Need for vaccination - Plan: Flu Vaccine QUAD 36+ mos IM  Crohn's disease of colon with complication (HCC)  -Stable, continue follow-up with gastroenterology.  Fourth COVID-vaccine pending soon with immunosuppression.  Erectile dysfunction, unspecified erectile dysfunction type  -Previously sildenafil prescribed by urology, discussed today so if refill needed, I can provide prescription but need to know dose.  Plantar fasciitis  -Symptomatic care and handout given, continue inserts as that has been helpful.  Ice massage and stretching also discussed with RTC precautions  No orders of the defined types were placed in this encounter.  Patient Instructions   No change in meds today. Fasting lab visit on 03/13/20.  6 month follow up.   See info on plantar fasciitis. If not continuing to improve, return for recheck.   Thanks for coming in today.    Plantar Fasciitis  Plantar fasciitis is a painful foot condition that affects the heel. It occurs when the band of tissue that connects the toes to the heel bone (plantar fascia) becomes irritated. This can happen as the result of exercising too much or doing other repetitive activities (overuse injury). Plantar fasciitis can cause mild irritation to severe pain that makes it difficult to walk or move. The pain is usually worse in the morning after sleeping, or after sitting or lying down for a period of time. Pain may also be worse after long periods of walking or standing. What are the causes? This condition may be caused by:  Standing for long periods of time.  Wearing shoes that do not have good arch support.  Doing activities that put stress on joints (high-impact activities). This includes ballet and exercise that makes your heart beat faster (aerobic exercise), such as running.  Being overweight.  An abnormal way of walking (gait).  Tight  muscles in the back of your lower leg (calf).  High arches in your feet or flat feet.  Starting a new athletic activity. What are the signs or symptoms? The main symptom of this condition is heel pain. Pain may get worse after the following:  Taking the first steps after a time of rest, especially in the morning after awakening, or after you have been sitting or lying down for a while.  Long periods of standing still. Pain may decrease after 30-45 minutes of activity, such as gentle walking. How is this diagnosed? This condition may be diagnosed based on your medical history, a physical exam, and your symptoms. Your health care provider will check for:  A tender area on the bottom of your foot.  A high arch in your foot or flat feet.  Pain when you move your foot.  Difficulty moving your foot. You may have imaging tests to confirm the diagnosis, such as:  X-rays.  Ultrasound.  MRI. How is this treated? Treatment for plantar fasciitis depends on how severe your condition is. Treatment may include:  Rest, ice, pressure (compression), and raising (elevating) the affected foot. This is called RICE therapy. Your health care provider may recommend RICE therapy along with over-the-counter pain medicines to manage your pain.  Exercises to stretch your calves and your plantar fascia.  A splint that holds your foot in a stretched, upward position while you sleep (night splint).  Physical therapy to relieve symptoms and prevent problems in the future.  Injections of steroid medicine (cortisone) to relieve pain and inflammation.  Stimulating your plantar fascia with electrical impulses (extracorporeal shock wave therapy). This is usually the last treatment option before surgery.  Surgery, if other treatments have not worked after 12 months. Follow these instructions at home: Managing pain, stiffness, and swelling  If directed, put ice on the painful area. To do this: ? Put ice in  a plastic bag, or use a frozen bottle of water. ? Place a towel between your skin and the bag or bottle. ? Roll the bottom of your foot over the bag or bottle. ? Do this for 20 minutes, 2-3 times a day.  Wear athletic shoes that have  air-sole or gel-sole cushions, or try soft shoe inserts that are designed for plantar fasciitis.  Elevate your foot above the level of your heart while you are sitting or lying down.   Activity  Avoid activities that cause pain. Ask your health care provider what activities are safe for you.  Do physical therapy exercises and stretches as told by your health care provider.  Try activities and forms of exercise that are easier on your joints (low impact). Examples include swimming, water aerobics, and biking. General instructions  Take over-the-counter and prescription medicines only as told by your health care provider.  Wear a night splint while sleeping, if told by your health care provider. Loosen the splint if your toes tingle, become numb, or turn cold and blue.  Maintain a healthy weight, or work with your health care provider to lose weight as needed.  Keep all follow-up visits. This is important. Contact a health care provider if you have:  Symptoms that do not go away with home treatment.  Pain that gets worse.  Pain that affects your ability to move or do daily activities. Summary  Plantar fasciitis is a painful foot condition that affects the heel. It occurs when the band of tissue that connects the toes to the heel bone (plantar fascia) becomes irritated.  Heel pain is the main symptom of this condition. It may get worse after exercising too much or standing still for a long time.  Treatment varies, but it usually starts with rest, ice, pressure (compression), and raising (elevating) the affected foot. This is called RICE therapy. Over-the-counter medicines can also be used to manage pain. This information is not intended to replace  advice given to you by your health care provider. Make sure you discuss any questions you have with your health care provider. Document Revised: 05/17/2019 Document Reviewed: 05/17/2019 Elsevier Patient Education  2021 [employer].   If you have lab work done today you will be contacted with your lab results within the next 2 weeks.  If you have not heard from [place] then please contact us. The fastest way to get your results is to register for My Chart.   IF you received an x-ray today, you will receive an invoice from [facility]. Please contact [facility] at [phone] with questions or concerns regarding your invoice.   IF you received labwork today, you will receive an invoice from [place]. Please contact [facility] at [phone] with questions or concerns regarding your invoice.   Our billing staff will not be able to assist you with questions regarding bills from these companies.  You will be contacted with the lab results as soon as they are available. The fastest way to get your results is to activate your My Chart account. Instructions are located on the last page of this paperwork. If you have not heard from [place] regarding the results in 2 weeks, please contact this office.         Signed, Meredith Staggers, MD [facility]

## 2020-03-06 ENCOUNTER — Ambulatory Visit (INDEPENDENT_AMBULATORY_CARE_PROVIDER_SITE_OTHER): Payer: BC Managed Care – PPO | Admitting: Family Medicine

## 2020-03-06 ENCOUNTER — Other Ambulatory Visit: Payer: Self-pay

## 2020-03-06 DIAGNOSIS — E291 Testicular hypofunction: Secondary | ICD-10-CM

## 2020-03-06 NOTE — Patient Instructions (Signed)
° ° ° °  If you have lab work done today you will be contacted with your lab results within the next 2 weeks.  If you have not heard from us then please contact us. The fastest way to get your results is to register for My Chart. ° ° °IF you received an x-ray today, you will receive an invoice from [facility]. Please contact [facility] at [phone] with questions or concerns regarding your invoice.  ° °IF you received labwork today, you will receive an invoice from [facility]. Please contact [facility] at [phone] with questions or concerns regarding your invoice.  ° °Our billing staff will not be able to assist you with questions regarding bills from these companies. ° °You will be contacted with the lab results as soon as they are available. The fastest way to get your results is to activate your My Chart account. Instructions are located on the last page of this paperwork. If you have not heard from us regarding the results in 2 weeks, please contact this office. °  ° ° ° °

## 2020-03-13 ENCOUNTER — Other Ambulatory Visit: Payer: Self-pay

## 2020-03-13 ENCOUNTER — Ambulatory Visit (INDEPENDENT_AMBULATORY_CARE_PROVIDER_SITE_OTHER): Payer: BC Managed Care – PPO | Admitting: Family Medicine

## 2020-03-13 DIAGNOSIS — E291 Testicular hypofunction: Secondary | ICD-10-CM

## 2020-03-13 DIAGNOSIS — Z5181 Encounter for therapeutic drug level monitoring: Secondary | ICD-10-CM

## 2020-03-14 LAB — COMPREHENSIVE METABOLIC PANEL
ALT: 7 IU/L (ref 0–44)
AST: 10 IU/L (ref 0–40)
Albumin/Globulin Ratio: 1.3 (ref 1.2–2.2)
Albumin: 4 g/dL (ref 3.8–4.9)
Alkaline Phosphatase: 83 IU/L (ref 44–121)
BUN/Creatinine Ratio: 9 (ref 9–20)
BUN: 10 mg/dL (ref 6–24)
Bilirubin Total: 0.4 mg/dL (ref 0.0–1.2)
CO2: 25 mmol/L (ref 20–29)
Calcium: 9.2 mg/dL (ref 8.7–10.2)
Chloride: 99 mmol/L (ref 96–106)
Creatinine, Ser: 1.14 mg/dL (ref 0.76–1.27)
GFR calc Af Amer: 81 mL/min/{1.73_m2} (ref >59)
GFR calc non Af Amer: 70 mL/min/{1.73_m2} (ref >59)
Globulin, Total: 3 g/dL (ref 1.5–4.5)
Glucose: 83 mg/dL (ref 65–99)
Potassium: 4.3 mmol/L (ref 3.5–5.2)
Sodium: 137 mmol/L (ref 134–144)
Total Protein: 7 g/dL (ref 6.0–8.5)

## 2020-03-14 LAB — LIPID PANEL
Chol/HDL Ratio: 2.3 ratio (ref 0.0–5.0)
Cholesterol, Total: 137 mg/dL (ref 100–199)
HDL: 60 mg/dL (ref >39)
LDL Chol Calc (NIH): 62 mg/dL (ref 0–99)
Triglycerides: 74 mg/dL (ref 0–149)
VLDL Cholesterol Cal: 15 mg/dL (ref 5–40)

## 2020-03-14 LAB — CBC
Hematocrit: 44.7 % (ref 37.5–51.0)
Hemoglobin: 15.1 g/dL (ref 13.0–17.7)
MCH: 30.2 pg (ref 26.6–33.0)
MCHC: 33.8 g/dL (ref 31.5–35.7)
MCV: 89 fL (ref 79–97)
Platelets: 378 10*3/uL (ref 150–450)
RBC: 5 x10E6/uL (ref 4.14–5.80)
RDW: 13 % (ref 11.6–15.4)
WBC: 7.6 10*3/uL (ref 3.4–10.8)

## 2020-03-14 LAB — PSA: Prostate Specific Ag, Serum: 0.7 ng/mL (ref 0.0–4.0)

## 2020-03-14 LAB — TESTOSTERONE: Testosterone: 845 ng/dL (ref 264–916)

## 2020-03-20 ENCOUNTER — Ambulatory Visit: Payer: BC Managed Care – PPO

## 2020-03-21 ENCOUNTER — Other Ambulatory Visit: Payer: Self-pay

## 2020-03-21 ENCOUNTER — Ambulatory Visit (INDEPENDENT_AMBULATORY_CARE_PROVIDER_SITE_OTHER): Payer: BC Managed Care – PPO | Admitting: Emergency Medicine

## 2020-03-21 DIAGNOSIS — E291 Testicular hypofunction: Secondary | ICD-10-CM

## 2020-04-03 ENCOUNTER — Ambulatory Visit (INDEPENDENT_AMBULATORY_CARE_PROVIDER_SITE_OTHER): Payer: BC Managed Care – PPO | Admitting: Family Medicine

## 2020-04-03 ENCOUNTER — Other Ambulatory Visit: Payer: Self-pay

## 2020-04-03 DIAGNOSIS — E291 Testicular hypofunction: Secondary | ICD-10-CM | POA: Diagnosis not present

## 2020-04-03 NOTE — Progress Notes (Signed)
LUOQ  200 mg/69m given Lot:[id number] NDC: [id number]:05/01/22

## 2020-04-17 ENCOUNTER — Other Ambulatory Visit: Payer: Self-pay

## 2020-04-17 ENCOUNTER — Ambulatory Visit (INDEPENDENT_AMBULATORY_CARE_PROVIDER_SITE_OTHER): Payer: BC Managed Care – PPO | Admitting: Family Medicine

## 2020-04-17 DIAGNOSIS — E291 Testicular hypofunction: Secondary | ICD-10-CM

## 2020-05-01 ENCOUNTER — Ambulatory Visit: Payer: BC Managed Care – PPO

## 2020-05-02 ENCOUNTER — Other Ambulatory Visit: Payer: Self-pay

## 2020-05-02 ENCOUNTER — Ambulatory Visit (INDEPENDENT_AMBULATORY_CARE_PROVIDER_SITE_OTHER): Payer: BC Managed Care – PPO | Admitting: Emergency Medicine

## 2020-05-02 DIAGNOSIS — E291 Testicular hypofunction: Secondary | ICD-10-CM | POA: Diagnosis not present

## 2020-05-31 ENCOUNTER — Ambulatory Visit (INDEPENDENT_AMBULATORY_CARE_PROVIDER_SITE_OTHER): Payer: BC Managed Care – PPO | Admitting: Registered Nurse

## 2020-05-31 ENCOUNTER — Other Ambulatory Visit: Payer: Self-pay

## 2020-05-31 DIAGNOSIS — E291 Testicular hypofunction: Secondary | ICD-10-CM

## 2020-05-31 NOTE — Progress Notes (Signed)
Per Dr Carlota Raspberry orders pt receives 1 mL testosterone injection every 2 weeks.   Pt missed last dose due to move of office, now back on schedule and will return for his next 2 week injection

## 2020-06-12 ENCOUNTER — Ambulatory Visit (INDEPENDENT_AMBULATORY_CARE_PROVIDER_SITE_OTHER): Payer: BC Managed Care – PPO | Admitting: Family Medicine

## 2020-06-12 ENCOUNTER — Other Ambulatory Visit: Payer: Self-pay

## 2020-06-12 DIAGNOSIS — E291 Testicular hypofunction: Secondary | ICD-10-CM

## 2020-06-12 NOTE — Progress Notes (Addendum)
Administered Patient testosterone injection RUQ per Dr Carlota Raspberry. Patient will return in two weeks.

## 2020-06-26 ENCOUNTER — Ambulatory Visit (INDEPENDENT_AMBULATORY_CARE_PROVIDER_SITE_OTHER): Payer: BC Managed Care – PPO | Admitting: Family Medicine

## 2020-06-26 ENCOUNTER — Other Ambulatory Visit: Payer: Self-pay

## 2020-06-26 DIAGNOSIS — E291 Testicular hypofunction: Secondary | ICD-10-CM | POA: Diagnosis not present

## 2020-06-26 NOTE — Progress Notes (Signed)
Patient was here to receive Testosterone Injection in LUQ. Patient tolerated well and will return in two weeks for another one per Dr. Carlota Raspberry.

## 2020-07-11 ENCOUNTER — Ambulatory Visit (INDEPENDENT_AMBULATORY_CARE_PROVIDER_SITE_OTHER): Payer: BC Managed Care – PPO | Admitting: Registered Nurse

## 2020-07-11 ENCOUNTER — Other Ambulatory Visit: Payer: Self-pay

## 2020-07-11 DIAGNOSIS — E291 Testicular hypofunction: Secondary | ICD-10-CM | POA: Diagnosis not present

## 2020-07-11 NOTE — Progress Notes (Signed)
270m/1 mL testosterone injection given in Right gluteal per Dr GCarlota RaspberryStanding orders for once every 2 weeks (14 days) tolerated well.

## 2020-07-24 ENCOUNTER — Ambulatory Visit: Payer: BC Managed Care – PPO

## 2020-07-25 ENCOUNTER — Ambulatory Visit (INDEPENDENT_AMBULATORY_CARE_PROVIDER_SITE_OTHER): Payer: BC Managed Care – PPO | Admitting: Registered Nurse

## 2020-07-25 ENCOUNTER — Other Ambulatory Visit: Payer: Self-pay

## 2020-07-25 DIAGNOSIS — E291 Testicular hypofunction: Secondary | ICD-10-CM

## 2020-07-25 NOTE — Progress Notes (Signed)
Pt here for testosterone injection per Dr Carlota Raspberry, continuation of therapy 1 ml every 2 weeks Dx Hypogonadism, male

## 2020-08-01 NOTE — Progress Notes (Signed)
Patient with procedure as noted.   Signed,   Merri Ray, MD [place] 08/01/20 11:36 AM

## 2020-08-07 ENCOUNTER — Ambulatory Visit (INDEPENDENT_AMBULATORY_CARE_PROVIDER_SITE_OTHER): Payer: BC Managed Care – PPO

## 2020-08-07 ENCOUNTER — Ambulatory Visit: Payer: BC Managed Care – PPO

## 2020-08-07 ENCOUNTER — Other Ambulatory Visit: Payer: Self-pay

## 2020-08-07 DIAGNOSIS — E291 Testicular hypofunction: Secondary | ICD-10-CM

## 2020-08-07 MED ORDER — TESTOSTERONE CYPIONATE 200 MG/ML IM SOLN
200.0000 mg | INTRAMUSCULAR | Status: DC
  Administered 2020-08-07 – 2020-09-04 (×2): 200 mg via INTRAMUSCULAR

## 2020-08-07 NOTE — Progress Notes (Addendum)
Patient presents to the office for his bi weekly testosterone injection. Patient was injected with 24m of cypionate in his right glute and tolerated well. Patient will return in 2 weeks for his next testosterone injection.

## 2020-08-08 ENCOUNTER — Other Ambulatory Visit: Payer: Self-pay | Admitting: Family Medicine

## 2020-08-08 ENCOUNTER — Other Ambulatory Visit: Payer: Self-pay

## 2020-08-08 DIAGNOSIS — E291 Testicular hypofunction: Secondary | ICD-10-CM

## 2020-08-09 ENCOUNTER — Telehealth: Payer: Self-pay

## 2020-08-09 DIAGNOSIS — E291 Testicular hypofunction: Secondary | ICD-10-CM

## 2020-08-09 MED ORDER — TESTOSTERONE CYPIONATE 200 MG/ML IM SOLN
INTRAMUSCULAR | 0 refills | Status: DC
Start: 2020-08-09 — End: 2020-08-10

## 2020-08-09 NOTE — Telephone Encounter (Signed)
Reordered to pharmacy

## 2020-08-09 NOTE — Telephone Encounter (Signed)
Pt call back [phone]  Work today only Pt received call from pharmacy that a electronic RX for testosterone cypionate (DEPOTESTOSTERONE CYPIONATE) 200 MG/ML injection [[id number]] was denied and has to be picked up? Pt is wanting to know if he needs to come by to pick this RX up or can it be resent electronic? [id number] [id number] [id number]

## 2020-08-10 MED ORDER — TESTOSTERONE CYPIONATE 200 MG/ML IM SOLN: INTRAMUSCULAR | 0 refills | Status: DC

## 2020-08-10 NOTE — Addendum Note (Signed)
Addended by: Merri Ray R on: 08/10/2020 08:58 PM   Modules accepted: Orders

## 2020-08-10 NOTE — Telephone Encounter (Signed)
Electronically prescribed again. Let me know if this does not work this time.

## 2020-08-21 ENCOUNTER — Ambulatory Visit (INDEPENDENT_AMBULATORY_CARE_PROVIDER_SITE_OTHER): Payer: BC Managed Care – PPO | Admitting: Family Medicine

## 2020-08-21 ENCOUNTER — Other Ambulatory Visit: Payer: Self-pay

## 2020-08-21 DIAGNOSIS — E291 Testicular hypofunction: Secondary | ICD-10-CM

## 2020-08-21 NOTE — Progress Notes (Signed)
Patient is here for his Testerone shot in LUQ Per Dr. Carlota Raspberry. Patient tolerated the medication well and will return in 2 weeks.   [place]: [id number] Lot: [id number] EXP: 03/2023

## 2020-09-04 ENCOUNTER — Ambulatory Visit (INDEPENDENT_AMBULATORY_CARE_PROVIDER_SITE_OTHER): Payer: BC Managed Care – PPO | Admitting: Registered Nurse

## 2020-09-04 ENCOUNTER — Other Ambulatory Visit: Payer: Self-pay

## 2020-09-04 DIAGNOSIS — E291 Testicular hypofunction: Secondary | ICD-10-CM

## 2020-09-04 DIAGNOSIS — R7989 Other specified abnormal findings of blood chemistry: Secondary | ICD-10-CM

## 2020-09-04 NOTE — Progress Notes (Signed)
Patient received testosterone medication Per Orland Mustard. Patient tolerated well.

## 2020-09-18 ENCOUNTER — Ambulatory Visit (INDEPENDENT_AMBULATORY_CARE_PROVIDER_SITE_OTHER): Payer: BC Managed Care – PPO

## 2020-09-18 ENCOUNTER — Other Ambulatory Visit: Payer: Self-pay

## 2020-09-18 DIAGNOSIS — E291 Testicular hypofunction: Secondary | ICD-10-CM | POA: Diagnosis not present

## 2020-09-18 MED ORDER — TESTOSTERONE CYPIONATE 200 MG/ML IM SOLN
200.0000 mg | INTRAMUSCULAR | Status: DC
  Administered 2020-09-18 – 2020-10-30 (×3): 200 mg via INTRAMUSCULAR

## 2020-09-18 NOTE — Progress Notes (Signed)
Patient presents to the office for his bi weekly testosterone injection. Patient received 94m of Cypionate in his right glute and tolerated well. Patient should return in 2 weeks around 10/16/20 for his next testosterone injection.

## 2020-10-02 ENCOUNTER — Ambulatory Visit: Payer: BC Managed Care – PPO

## 2020-10-03 ENCOUNTER — Other Ambulatory Visit: Payer: Self-pay

## 2020-10-03 ENCOUNTER — Ambulatory Visit (INDEPENDENT_AMBULATORY_CARE_PROVIDER_SITE_OTHER): Payer: BC Managed Care – PPO | Admitting: Registered Nurse

## 2020-10-03 VITALS — Temp 98.0°F

## 2020-10-03 DIAGNOSIS — E291 Testicular hypofunction: Secondary | ICD-10-CM | POA: Diagnosis not present

## 2020-10-03 NOTE — Progress Notes (Addendum)
Marcus Gutierrez is a 60 y.o. male presents to the office today for biweekly Testosterone injections, per physician's orders. Original order: Testosterone , 1 mL (dose),  intramuscular (route) was administered ventrogluteal Lt (location) today. Patient tolerated injection. Patient due for follow up labs/provider appt: Yes. Date due: physical due 07/2020, appt made Yes Patient next injection due: 10/17/2020, appt made Yes  [name]    Reviewed personally by myself -ok with administration and follow up   Maximiano Coss, NP

## 2020-10-11 ENCOUNTER — Other Ambulatory Visit: Payer: Self-pay

## 2020-10-11 ENCOUNTER — Encounter (HOSPITAL_COMMUNITY): Payer: Self-pay | Admitting: *Deleted

## 2020-10-11 ENCOUNTER — Ambulatory Visit (HOSPITAL_COMMUNITY)
Admission: EM | Admit: 2020-10-11 | Discharge: 2020-10-11 | Disposition: A | Discharge status: Home / Self Care | Payer: BC Managed Care – PPO | Attending: Family Medicine | Admitting: Family Medicine

## 2020-10-11 DIAGNOSIS — S161XXA Strain of muscle, fascia and tendon at neck level, initial encounter: Secondary | ICD-10-CM

## 2020-10-11 DIAGNOSIS — R0781 Pleurodynia: Secondary | ICD-10-CM

## 2020-10-11 DIAGNOSIS — M79605 Pain in left leg: Secondary | ICD-10-CM

## 2020-10-11 DIAGNOSIS — M79604 Pain in right leg: Secondary | ICD-10-CM

## 2020-10-11 MED ORDER — CYCLOBENZAPRINE HCL 10 MG PO TABS: 5.0000 mg | ORAL_TABLET | Freq: Three times a day (TID) | ORAL | 0 refills | Status: DC | PRN

## 2020-10-11 MED ORDER — NAPROXEN 500 MG PO TABS: 500.0000 mg | ORAL_TABLET | Freq: Two times a day (BID) | ORAL | 0 refills | Status: DC | PRN
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
## 2020-10-17 ENCOUNTER — Ambulatory Visit (INDEPENDENT_AMBULATORY_CARE_PROVIDER_SITE_OTHER): Payer: BC Managed Care – PPO | Admitting: Registered Nurse

## 2020-10-17 ENCOUNTER — Ambulatory Visit: Payer: BC Managed Care – PPO

## 2020-10-17 ENCOUNTER — Other Ambulatory Visit: Payer: Self-pay

## 2020-10-17 DIAGNOSIS — E291 Testicular hypofunction: Secondary | ICD-10-CM | POA: Diagnosis not present

## 2020-10-17 MED ORDER — TESTOSTERONE CYPIONATE 200 MG/ML IM SOLN
200.0000 mg | INTRAMUSCULAR | Status: DC
  Administered 2020-10-17: 200 mg via INTRAMUSCULAR

## 2020-10-17 NOTE — Progress Notes (Signed)
Patient presents to the office to receive his testosterone injection. Patient was injected with 71m of cypionate in his rt glute. Patient tolerated well. He will return in 2 weeks to receive another testosterone injection.

## 2020-10-26 ENCOUNTER — Other Ambulatory Visit: Payer: Self-pay

## 2020-10-26 ENCOUNTER — Ambulatory Visit: Payer: BC Managed Care – PPO | Admitting: Registered Nurse

## 2020-10-26 ENCOUNTER — Encounter: Payer: Self-pay | Admitting: Registered Nurse

## 2020-10-26 VITALS — BP 120/84 | HR 90 | Temp 98.2°F | Resp 18 | Ht 69.0 in | Wt 176.2 lb

## 2020-10-26 DIAGNOSIS — N451 Epididymitis: Secondary | ICD-10-CM | POA: Diagnosis not present

## 2020-10-26 DIAGNOSIS — Z23 Encounter for immunization: Secondary | ICD-10-CM | POA: Diagnosis not present

## 2020-10-26 DIAGNOSIS — L718 Other rosacea: Secondary | ICD-10-CM | POA: Insufficient documentation

## 2020-10-26 MED ORDER — LEVOFLOXACIN 500 MG PO TABS: 500.0000 mg | ORAL_TABLET | Freq: Every day | ORAL | 0 refills | Status: DC

## 2020-10-26 MED ORDER — CELECOXIB 200 MG PO CAPS: 200.0000 mg | ORAL_CAPSULE | Freq: Two times a day (BID) | ORAL | 0 refills | Status: DC

## 2020-10-26 MED ORDER — METHYLPREDNISOLONE ACETATE 80 MG/ML IJ SUSP
80.0000 mg | Freq: Once | INTRAMUSCULAR | Status: AC
  Administered 2020-10-26: 80 mg via INTRAMUSCULAR
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
## 2020-10-30 ENCOUNTER — Other Ambulatory Visit: Payer: Self-pay

## 2020-10-30 ENCOUNTER — Ambulatory Visit (INDEPENDENT_AMBULATORY_CARE_PROVIDER_SITE_OTHER): Payer: BC Managed Care – PPO | Admitting: Family Medicine

## 2020-10-30 DIAGNOSIS — E291 Testicular hypofunction: Secondary | ICD-10-CM

## 2020-10-30 MED ORDER — TESTOSTERONE CYPIONATE 200 MG/ML IM SOLN: 200.0000 mg | INTRAMUSCULAR | Status: DC

## 2020-11-03 NOTE — Progress Notes (Signed)
Marcus Gutierrez is a 61 y.o. male presents to the office today for testosterone injections, per physician's orders. Patient tolerated injection. Patient due for follow up labs/provider appt: No. Date due: 12/07/2020, appt made Yes Patient next injection due: 11/13/2020, appt made Yes  Ordered Admin Dose: 1 mL = 200 mg of 200 mg/mL  Frequency: Every 14 days  Route: Intramuscular  Ordered Dose: 200 mg  Order ID: [id number]  Order Start Time: 10/30/20 at [place]  References: Micromedex Drug Information   Juliann Pulse

## 2020-11-04 ENCOUNTER — Encounter: Payer: Self-pay | Admitting: Registered Nurse

## 2020-11-06 ENCOUNTER — Other Ambulatory Visit: Payer: Self-pay | Admitting: Registered Nurse

## 2020-11-06 DIAGNOSIS — N50819 Testicular pain, unspecified: Secondary | ICD-10-CM
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
## 2020-11-09 ENCOUNTER — Other Ambulatory Visit: Payer: Self-pay

## 2020-11-09 ENCOUNTER — Encounter: Payer: Self-pay | Admitting: Family Medicine

## 2020-11-09 DIAGNOSIS — E291 Testicular hypofunction: Secondary | ICD-10-CM

## 2020-11-09 MED ORDER — TESTOSTERONE CYPIONATE 200 MG/ML IM SOLN: INTRAMUSCULAR | 0 refills | Status: DC

## 2020-11-09 NOTE — Telephone Encounter (Signed)
Requesting:Testosterone Cypionate Contract: UDS: Last Visit:10/30/20 Next Visit:11/14/20 Last Refill:08/10/20 61m 0 refills  Please Advise

## 2020-11-09 NOTE — Telephone Encounter (Signed)
Controlled substance database reviewed, has upcoming appointment October 3, testosterone refilled in anticipation of that appointment.

## 2020-11-13 ENCOUNTER — Other Ambulatory Visit: Payer: Self-pay

## 2020-11-13 ENCOUNTER — Ambulatory Visit (INDEPENDENT_AMBULATORY_CARE_PROVIDER_SITE_OTHER): Payer: BC Managed Care – PPO | Admitting: Family Medicine

## 2020-11-13 DIAGNOSIS — E291 Testicular hypofunction: Secondary | ICD-10-CM

## 2020-11-13 MED ORDER — TESTOSTERONE CYPIONATE 200 MG/ML IM SOLN
200.0000 mg | INTRAMUSCULAR | Status: DC
  Administered 2020-12-11: 200 mg via INTRAMUSCULAR

## 2020-11-13 NOTE — Progress Notes (Signed)
Patient presents to the office to receive his testosterone injection. Patient received 18m of testosterone cypionate in his rt glute. Patient tolerated well. Pt will return in about 2 weeks to receive his next dose.

## 2020-11-14 NOTE — Patient Instructions (Signed)
Note reviewed, agree with order for testosterone injection.  Nurse visit only, patient not seen.

## 2020-11-16 ENCOUNTER — Ambulatory Visit
Admission: RE | Admit: 2020-11-16 | Discharge: 2020-11-16 | Disposition: A | Discharge status: Home / Self Care | Payer: BC Managed Care – PPO | Source: Ambulatory Visit | Attending: Registered Nurse | Admitting: Registered Nurse

## 2020-11-16 DIAGNOSIS — N50819 Testicular pain, unspecified: Secondary | ICD-10-CM

## 2020-11-27 ENCOUNTER — Ambulatory Visit (INDEPENDENT_AMBULATORY_CARE_PROVIDER_SITE_OTHER): Payer: BC Managed Care – PPO | Admitting: Family Medicine

## 2020-11-27 ENCOUNTER — Other Ambulatory Visit: Payer: Self-pay

## 2020-11-27 DIAGNOSIS — E291 Testicular hypofunction: Secondary | ICD-10-CM | POA: Diagnosis not present

## 2020-11-27 MED ORDER — TESTOSTERONE CYPIONATE 200 MG/ML IM SOLN: 200.0000 mg | INTRAMUSCULAR | Status: DC

## 2020-11-27 NOTE — Progress Notes (Signed)
Patient presents to the office for his testosterone injection. Patient was injected with 106m of cypionate in his left glute and tolerated well. Patient will return to the office in two weeks from today and receive his next testosterone injection.

## 2020-12-07 ENCOUNTER — Ambulatory Visit (INDEPENDENT_AMBULATORY_CARE_PROVIDER_SITE_OTHER): Payer: BC Managed Care – PPO | Admitting: Family Medicine

## 2020-12-07 ENCOUNTER — Other Ambulatory Visit: Payer: Self-pay

## 2020-12-07 ENCOUNTER — Encounter: Payer: Self-pay | Admitting: Family Medicine

## 2020-12-07 VITALS — BP 128/74 | HR 88 | Temp 98.2°F | Resp 16 | Ht 69.0 in | Wt 177.6 lb

## 2020-12-07 DIAGNOSIS — Z13220 Encounter for screening for lipoid disorders: Secondary | ICD-10-CM

## 2020-12-07 DIAGNOSIS — Z125 Encounter for screening for malignant neoplasm of prostate: Secondary | ICD-10-CM | POA: Diagnosis not present

## 2020-12-07 DIAGNOSIS — M722 Plantar fascial fibromatosis: Secondary | ICD-10-CM

## 2020-12-07 DIAGNOSIS — E291 Testicular hypofunction: Secondary | ICD-10-CM | POA: Diagnosis not present

## 2020-12-07 DIAGNOSIS — Z809 Family history of malignant neoplasm, unspecified: Secondary | ICD-10-CM

## 2020-12-07 DIAGNOSIS — Z0000 Encounter for general adult medical examination without abnormal findings: Secondary | ICD-10-CM | POA: Diagnosis not present

## 2020-12-07 DIAGNOSIS — Z131 Encounter for screening for diabetes mellitus: Secondary | ICD-10-CM

## 2020-12-07 DIAGNOSIS — Z130 Encounter for screening for diseases of the blood and blood-forming organs and certain disorders involving the immune mechanism: Secondary | ICD-10-CM

## 2020-12-07 DIAGNOSIS — K50119 Crohn's disease of large intestine with unspecified complications: Secondary | ICD-10-CM

## 2020-12-07 MED ORDER — TESTOSTERONE CYPIONATE 200 MG/ML IM SOLN: INTRAMUSCULAR | 0 refills | Status: DC

## 2020-12-07 NOTE — Patient Instructions (Addendum)
If you still require celebrex after the next few weeks, return to discuss a plan. Also follow up if testicular pain returns.  I will check monitoring labs, 6 month follow up if stable.  No med changes today.  I will refer you to genetics to decide on other screening.  If plantar fasciitis not improved, follow up to discuss options.    [facility] is now offering the bivalent Covid booster vaccine. To schedule an appointment or to find more information, visit [email or web address] You can also call [phone], Monday through Friday, 7 a.m. to 7 p.m.  Plantar Fasciitis Plantar fasciitis is a painful foot condition that affects the heel. It occurs when the band of tissue that connects the toes to the heel bone (plantar fascia) becomes irritated. This can happen as the result of exercising too much or doing other repetitive activities (overuse injury). Plantar fasciitis can cause mild irritation to severe pain that makes it difficult to walk or move. The pain is usually worse in the morning after sleeping, or after sitting or lying down for a period of time. Pain may also be worse after long periods of walking or standing. What are the causes? This condition may be caused by: Standing for long periods of time. Wearing shoes that do not have good arch support. Doing activities that put stress on joints (high-impact activities). This includes ballet and exercise that makes your heart beat faster (aerobic exercise), such as running. Being overweight. An abnormal way of walking (gait). Tight muscles in the back of your lower leg (calf). High arches in your feet or flat feet. Starting a new athletic activity. What are the signs or symptoms? The main symptom of this condition is heel pain. Pain may get worse after the following: Taking the first steps after a time of rest, especially in the morning after awakening, or after you have been sitting or lying down for a while. Long periods of standing  still. Pain may decrease after 30-45 minutes of activity, such as gentle walking. How is this diagnosed? This condition may be diagnosed based on your medical history, a physical exam, and your symptoms. Your health care provider will check for: A tender area on the bottom of your foot. A high arch in your foot or flat feet. Pain when you move your foot. Difficulty moving your foot. You may have imaging tests to confirm the diagnosis, such as: X-rays. Ultrasound. MRI. How is this treated? Treatment for plantar fasciitis depends on how severe your condition is. Treatment may include: Rest, ice, pressure (compression), and raising (elevating) the affected foot. This is called RICE therapy. Your health care provider may recommend RICE therapy along with over-the-counter pain medicines to manage your pain. Exercises to stretch your calves and your plantar fascia. A splint that holds your foot in a stretched, upward position while you sleep (night splint). Physical therapy to relieve symptoms and prevent problems in the future. Injections of steroid medicine (cortisone) to relieve pain and inflammation. Stimulating your plantar fascia with electrical impulses (extracorporeal shock wave therapy). This is usually the last treatment option before surgery. Surgery, if other treatments have not worked after 12 months. Follow these instructions at home: Managing pain, stiffness, and swelling  If directed, put ice on the painful area. To do this: Put ice in a plastic bag, or use a frozen bottle of water. Place a towel between your skin and the bag or bottle. Roll the bottom of your foot over the bag or bottle. Do  this for 20 minutes, 2-3 times a day. Wear athletic shoes that have air-sole or gel-sole cushions, or try soft shoe inserts that are designed for plantar fasciitis. Elevate your foot above the level of your heart while you are sitting or lying down. Activity Avoid activities that cause  pain. Ask your health care provider what activities are safe for you. Do physical therapy exercises and stretches as told by your health care provider. Try activities and forms of exercise that are easier on your joints (low impact). Examples include swimming, water aerobics, and biking. General instructions Take over-the-counter and prescription medicines only as told by your health care provider. Wear a night splint while sleeping, if told by your health care provider. Loosen the splint if your toes tingle, become numb, or turn cold and blue. Maintain a healthy weight, or work with your health care provider to lose weight as needed. Keep all follow-up visits. This is important. Contact a health care provider if you have: Symptoms that do not go away with home treatment. Pain that gets worse. Pain that affects your ability to move or do daily activities. Summary Plantar fasciitis is a painful foot condition that affects the heel. It occurs when the band of tissue that connects the toes to the heel bone (plantar fascia) becomes irritated. Heel pain is the main symptom of this condition. It may get worse after exercising too much or standing still for a long time. Treatment varies, but it usually starts with rest, ice, pressure (compression), and raising (elevating) the affected foot. This is called RICE therapy. Over-the-counter medicines can also be used to manage pain. This information is not intended to replace advice given to you by your health care provider. Make sure you discuss any questions you have with your health care provider. Document Revised: 05/17/2019 Document Reviewed: 05/17/2019 Elsevier Patient Education  2022 [employer]   Preventive Care 29-79 Years Old, Male Preventive care refers to lifestyle choices and visits with your health care provider that can promote health and wellness. This includes: A yearly physical exam. This is also called an annual wellness  visit. Regular dental and eye exams. Immunizations. Screening for certain conditions. Healthy lifestyle choices, such as: Eating a healthy diet. Getting regular exercise. Not using drugs or products that contain nicotine and tobacco. Limiting alcohol use. What can I expect for my preventive care visit? Physical exam Your health care provider will check your: Height and weight. These may be used to calculate your BMI (body mass index). BMI is a measurement that tells if you are at a healthy weight. Heart rate and blood pressure. Body temperature. Skin for abnormal spots. Counseling Your health care provider may ask you questions about your: Past medical problems. Family's medical history. Alcohol, tobacco, and drug use. Emotional well-being. Home life and relationship well-being. Sexual activity. Diet, exercise, and sleep habits. Work and work Statistician. Access to firearms. What immunizations do I need? Vaccines are usually given at various ages, according to a schedule. Your health care provider will recommend vaccines for you based on your age, medical history, and lifestyle or other factors, such as travel or where you work. What tests do I need? Blood tests Lipid and cholesterol levels. These may be checked every 5 years, or more often if you are over 74 years old. Hepatitis C test. Hepatitis B test. Screening Lung cancer screening. You may have this screening every year starting at age 35 if you have a 30-pack-year history of smoking and currently smoke  or have quit within the past 15 years. Prostate cancer screening. Recommendations will vary depending on your family history and other risks. Genital exam to check for testicular cancer or hernias. Colorectal cancer screening. All adults should have this screening starting at age 78 and continuing until age 42. Your health care provider may recommend screening at age 42 if you are at increased risk. You will have tests  every 1-10 years, depending on your results and the type of screening test. Diabetes screening. This is done by checking your blood sugar (glucose) after you have not eaten for a while (fasting). You may have this done every 1-3 years. STD (sexually transmitted disease) testing, if you are at risk. Follow these instructions at home: Eating and drinking  Eat a diet that includes fresh fruits and vegetables, whole grains, lean protein, and low-fat dairy products. Take vitamin and mineral supplements as recommended by your health care provider. Do not drink alcohol if your health care provider tells you not to drink. If you drink alcohol: Limit how much you have to 0-2 drinks a day. Be aware of how much alcohol is in your drink. In the [place] one drink equals one 12 oz bottle of beer (355 mL), one 5 oz glass of wine (148 mL), or one 1 oz glass of hard liquor (44 mL). Lifestyle Take daily care of your teeth and gums. Brush your teeth every morning and night with fluoride toothpaste. Floss one time each day. Stay active. Exercise for at least 30 minutes 5 or more days each week. Do not use any products that contain nicotine or tobacco, such as cigarettes, e-cigarettes, and chewing tobacco. If you need help quitting, ask your health care provider. Do not use drugs. If you are sexually active, practice safe sex. Use a condom or other form of protection to prevent STIs (sexually transmitted infections). If told by your health care provider, take low-dose aspirin daily starting at age 3. Find healthy ways to cope with stress, such as: Meditation, yoga, or listening to music. Journaling. Talking to a trusted person. Spending time with friends and family. Safety Always wear your seat belt while driving or riding in a vehicle. Do not drive: If you have been drinking alcohol. Do not ride with someone who has been drinking. When you are tired or distracted. While texting. Wear a helmet and other  protective equipment during sports activities. If you have firearms in your house, make sure you follow all gun safety procedures. What's next? Go to your health care provider once a year for an annual wellness visit. Ask your health care provider how often you should have your eyes and teeth checked. Stay up to date on all vaccines. This information is not intended to replace advice given to you by your health care provider. Make sure you discuss any questions you have with your health care provider. Document Revised: 04/07/2020 Document Reviewed: 01/22/2018 Elsevier Patient Education  2022 [employer].
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
## 2020-12-08 LAB — COMPREHENSIVE METABOLIC PANEL
ALT: 6 U/L (ref 0–53)
AST: 12 U/L (ref 0–37)
Albumin: 4 g/dL (ref 3.5–5.2)
Alkaline Phosphatase: 61 U/L (ref 39–117)
BUN: 13 mg/dL (ref 6–23)
CO2: 27 mEq/L (ref 19–32)
Calcium: 9.3 mg/dL (ref 8.4–10.5)
Chloride: 103 mEq/L (ref 96–112)
Creatinine, Ser: 1.17 mg/dL (ref 0.40–1.50)
GFR: 68.25 mL/min (ref >60.00)
Glucose, Bld: 101 mg/dL — ABNORMAL HIGH (ref 70–99)
Potassium: 3.9 mEq/L (ref 3.5–5.1)
Sodium: 140 mEq/L (ref 135–145)
Total Bilirubin: 0.6 mg/dL (ref 0.2–1.2)
Total Protein: 7.4 g/dL (ref 6.0–8.3)

## 2020-12-08 LAB — LIPID PANEL
Cholesterol: 150 mg/dL (ref 0–200)
HDL: 57.2 mg/dL (ref >39.00)
LDL Cholesterol: 75 mg/dL (ref 0–99)
NonHDL: 93.16
Total CHOL/HDL Ratio: 3
Triglycerides: 91 mg/dL (ref 0.0–149.0)
VLDL: 18.2 mg/dL (ref 0.0–40.0)

## 2020-12-08 LAB — CBC WITH DIFFERENTIAL/PLATELET
Basophils Absolute: 0.1 10*3/uL (ref 0.0–0.1)
Basophils Relative: 1.4 % (ref 0.0–3.0)
Eosinophils Absolute: 0.2 10*3/uL (ref 0.0–0.7)
Eosinophils Relative: 2.4 % (ref 0.0–5.0)
HCT: 46.7 % (ref 39.0–52.0)
Hemoglobin: 15.3 g/dL (ref 13.0–17.0)
Lymphocytes Relative: 45 % (ref 12.0–46.0)
Lymphs Abs: 3.1 10*3/uL (ref 0.7–4.0)
MCHC: 32.8 g/dL (ref 30.0–36.0)
MCV: 90.4 fl (ref 78.0–100.0)
Monocytes Absolute: 0.8 10*3/uL (ref 0.1–1.0)
Monocytes Relative: 12.1 % — ABNORMAL HIGH (ref 3.0–12.0)
Neutro Abs: 2.7 10*3/uL (ref 1.4–7.7)
Neutrophils Relative %: 39.1 % — ABNORMAL LOW (ref 43.0–77.0)
Platelets: 310 10*3/uL (ref 150.0–400.0)
RBC: 5.16 Mil/uL (ref 4.22–5.81)
RDW: 14.3 % (ref 11.5–15.5)
WBC: 6.8 10*3/uL (ref 4.0–10.5)

## 2020-12-08 LAB — PSA: PSA: 5.16 ng/mL — ABNORMAL HIGH (ref 0.10–4.00)

## 2020-12-08 LAB — HEMOGLOBIN A1C: Hgb A1c MFr Bld: 6.1 % (ref 4.6–6.5)

## 2020-12-08 LAB — TESTOSTERONE: Testosterone: 414.9 ng/dL (ref 300.00–890.00)

## 2020-12-09 ENCOUNTER — Encounter: Payer: Self-pay | Admitting: Family Medicine

## 2020-12-11 ENCOUNTER — Other Ambulatory Visit: Payer: Self-pay

## 2020-12-11 ENCOUNTER — Ambulatory Visit (INDEPENDENT_AMBULATORY_CARE_PROVIDER_SITE_OTHER): Payer: BC Managed Care – PPO | Admitting: Family Medicine

## 2020-12-11 DIAGNOSIS — E291 Testicular hypofunction: Secondary | ICD-10-CM | POA: Diagnosis not present

## 2020-12-11 MED ORDER — TESTOSTERONE CYPIONATE 200 MG/ML IM SOLN
200.0000 mg | INTRAMUSCULAR | Status: DC
  Administered 2021-01-10 – 2021-04-16 (×6): 200 mg via INTRAMUSCULAR

## 2020-12-12 NOTE — Progress Notes (Signed)
Nurse visit, patient not evaluated by me.Marland Kitchen

## 2020-12-14 ENCOUNTER — Telehealth: Payer: Self-pay | Admitting: Genetic Counselor

## 2020-12-14 NOTE — Telephone Encounter (Signed)
Scheduled appt per 10/27 referral. Pt is aware of appt date and time.

## 2020-12-18 ENCOUNTER — Other Ambulatory Visit: Payer: Self-pay | Admitting: Registered Nurse

## 2020-12-18 NOTE — Progress Notes (Deleted)
Subjective:  Patient ID: Marcus Gutierrez, male    DOB: 08-08-1960  Age: 60 y.o. MRN: [id number]  CC:  Chief Complaint  Patient presents with   Annual Exam    Pt here for annual exam, no concerns foot issue has has improved     HPI AVISH TORRY presents for   Annual exam     History Patient Active Problem List   Diagnosis Date Noted   Acne rosacea, papular type 10/26/2020   Depression 03/05/2015   Rising PSA level 02/13/2015   Colitis 12/11/2011   Hypogonadism male 11/29/2011   Past Medical History:  Diagnosis Date   Allergy    GERD (gastroesophageal reflux disease)    Seizures ([place])    Ulcer    Ulcerative colitis ([place])    Past Surgical History:  Procedure Laterality Date   HERNIA REPAIR     Allergies  Allergen Reactions   Amoxicillin Other (See Comments)    hematochezia   Androgel [Testosterone]    Prior to Admission medications   Medication Sig Start Date End Date Taking? Authorizing Provider  celecoxib (CELEBREX) 200 MG capsule Take 1 capsule (200 mg total) by mouth 2 (two) times daily. 10/26/20   Maximiano Coss, NP  cetirizine (ZYRTEC) 10 MG tablet Take 1 tablet (10 mg total) by mouth daily. 12/17/18   Jacelyn Pi, Lilia Argue, MD  fluticasone [facility]) 50 MCG/ACT nasal spray Place 1 spray into both nostrils 2 (two) times daily. 12/17/18   Daleen Squibb, MD  HUMIRA PEN-CD/UC/HS STARTER 80 MG/0.8ML PNKT Inject into the skin. 07/29/19   [provider]  PAZEO 0.7 % SOLN Place 1 drop into both eyes daily as needed. 07/03/16   Forrest Moron, MD  testosterone cypionate (DEPOTESTOSTERONE CYPIONATE) 200 MG/ML injection TAKE 1 ML VIAL TO OFFICE TO GET INJECTION EVERY TWO WEEKS 12/07/20   Wendie Agreste, MD   Social History   Socioeconomic History   Marital status: Unknown    Spouse name: Not on file   Number of children: Not on file   Years of education: Not on file   Highest education level: Not on file  Occupational History   Not on  file  Tobacco Use   Smoking status: Former    Types: Cigarettes    Quit date: 06/01/2003    Years since quitting: 17.5   Smokeless tobacco: Never  Vaping Use   Vaping Use: Never used  Substance and Sexual Activity   Alcohol use: Yes    Comment: rare occasion   Drug use: No   Sexual activity: Yes  Other Topics Concern   Not on file  Social History Narrative   Not on file   Social Determinants of Health   Financial Resource Strain: Not on file  Food Insecurity: Not on file  Transportation Needs: Not on file  Physical Activity: Not on file  Stress: Not on file  Social Connections: Not on file  Intimate Partner Violence: Not on file    Review of Systems   Objective:   Vitals:   12/07/20 1404  BP: 128/74  Pulse: 88  Resp: 16  Temp: 98.2 F (36.8 C)  TempSrc: Temporal  SpO2: 98%  Weight: 177 lb 9.6 oz (80.6 kg)  Height: 5' 9"  (1.753 m)     Physical Exam     Assessment & Plan:  Marcus Gutierrez is a 60 y.o. male . Annual physical exam - Plan: Comprehensive metabolic panel, CBC with Differential/Platelet, Lipid  panel, Hemoglobin A1c, PSA, Testosterone  Hypogonadism male - Plan: Testosterone, testosterone cypionate (DEPOTESTOSTERONE CYPIONATE) 200 MG/ML injection  Screening for deficiency anemia - Plan: CBC with Differential/Platelet  Screening for hyperlipidemia - Plan: Comprehensive metabolic panel, Lipid panel  Screening for diabetes mellitus - Plan: Comprehensive metabolic panel, Hemoglobin A1c  Screening for prostate cancer - Plan: PSA  Family history of cancer - Plan: Ambulatory referral to Genetics  Crohn's disease of colon with complication ([place])  Plantar fasciitis   Meds ordered this encounter  Medications   testosterone cypionate (DEPOTESTOSTERONE CYPIONATE) 200 MG/ML injection    Sig: TAKE 1 ML VIAL TO OFFICE TO GET INJECTION EVERY TWO WEEKS    Dispense:  6 mL    Refill:  0    This request is for a new prescription for a controlled  substance as required by Federal/State law.   Patient Instructions  If you still require celebrex after the next few weeks, return to discuss a plan. Also follow up if testicular pain returns.  I will check monitoring labs, 6 month follow up if stable.  No med changes today.  I will refer you to genetics to decide on other screening.  If plantar fasciitis not improved, follow up to discuss options.    [place] is now offering the bivalent Covid booster vaccine. To schedule an appointment or to find more information, visit [email or web address] You can also call [phone], Monday through Friday, 7 a.m. to 7 p.m.  Plantar Fasciitis Plantar fasciitis is a painful foot condition that affects the heel. It occurs when the band of tissue that connects the toes to the heel bone (plantar fascia) becomes irritated. This can happen as the result of exercising too much or doing other repetitive activities (overuse injury). Plantar fasciitis can cause mild irritation to severe pain that makes it difficult to walk or move. The pain is usually worse in the morning after sleeping, or after sitting or lying down for a period of time. Pain may also be worse after long periods of walking or standing. What are the causes? This condition may be caused by: Standing for long periods of time. Wearing shoes that do not have good arch support. Doing activities that put stress on joints (high-impact activities). This includes ballet and exercise that makes your heart beat faster (aerobic exercise), such as running. Being overweight. An abnormal way of walking (gait). Tight muscles in the back of your lower leg (calf). High arches in your feet or flat feet. Starting a new athletic activity. What are the signs or symptoms? The main symptom of this condition is heel pain. Pain may get worse after the following: Taking the first steps after a time of rest, especially in the morning after awakening, or after you  have been sitting or lying down for a while. Long periods of standing still. Pain may decrease after 30-45 minutes of activity, such as gentle walking. How is this diagnosed? This condition may be diagnosed based on your medical history, a physical exam, and your symptoms. Your health care provider will check for: A tender area on the bottom of your foot. A high arch in your foot or flat feet. Pain when you move your foot. Difficulty moving your foot. You may have imaging tests to confirm the diagnosis, such as: X-rays. Ultrasound. MRI. How is this treated? Treatment for plantar fasciitis depends on how severe your condition is. Treatment may include: Rest, ice, pressure (compression), and raising (elevating) the affected foot. This is called RICE  therapy. Your health care provider may recommend RICE therapy along with over-the-counter pain medicines to manage your pain. Exercises to stretch your calves and your plantar fascia. A splint that holds your foot in a stretched, upward position while you sleep (night splint). Physical therapy to relieve symptoms and prevent problems in the future. Injections of steroid medicine (cortisone) to relieve pain and inflammation. Stimulating your plantar fascia with electrical impulses (extracorporeal shock wave therapy). This is usually the last treatment option before surgery. Surgery, if other treatments have not worked after 12 months. Follow these instructions at home: Managing pain, stiffness, and swelling  If directed, put ice on the painful area. To do this: Put ice in a plastic bag, or use a frozen bottle of water. Place a towel between your skin and the bag or bottle. Roll the bottom of your foot over the bag or bottle. Do this for 20 minutes, 2-3 times a day. Wear athletic shoes that have air-sole or gel-sole cushions, or try soft shoe inserts that are designed for plantar fasciitis. Elevate your foot above the level of your heart while  you are sitting or lying down. Activity Avoid activities that cause pain. Ask your health care provider what activities are safe for you. Do physical therapy exercises and stretches as told by your health care provider. Try activities and forms of exercise that are easier on your joints (low impact). Examples include swimming, water aerobics, and biking. General instructions Take over-the-counter and prescription medicines only as told by your health care provider. Wear a night splint while sleeping, if told by your health care provider. Loosen the splint if your toes tingle, become numb, or turn cold and blue. Maintain a healthy weight, or work with your health care provider to lose weight as needed. Keep all follow-up visits. This is important. Contact a health care provider if you have: Symptoms that do not go away with home treatment. Pain that gets worse. Pain that affects your ability to move or do daily activities. Summary Plantar fasciitis is a painful foot condition that affects the heel. It occurs when the band of tissue that connects the toes to the heel bone (plantar fascia) becomes irritated. Heel pain is the main symptom of this condition. It may get worse after exercising too much or standing still for a long time. Treatment varies, but it usually starts with rest, ice, pressure (compression), and raising (elevating) the affected foot. This is called RICE therapy. Over-the-counter medicines can also be used to manage pain. This information is not intended to replace advice given to you by your health care provider. Make sure you discuss any questions you have with your health care provider. Document Revised: 05/17/2019 Document Reviewed: 05/17/2019 Elsevier Patient Education  2022 Elsevier Inc.   Preventive Care 32-98 Years Old, Male Preventive care refers to lifestyle choices and visits with your health care provider that can promote health and wellness. This includes: A  yearly physical exam. This is also called an annual wellness visit. Regular dental and eye exams. Immunizations. Screening for certain conditions. Healthy lifestyle choices, such as: Eating a healthy diet. Getting regular exercise. Not using drugs or products that contain nicotine and tobacco. Limiting alcohol use. What can I expect for my preventive care visit? Physical exam Your health care provider will check your: Height and weight. These may be used to calculate your BMI (body mass index). BMI is a measurement that tells if you are at a healthy weight. Heart rate and blood pressure.  Body temperature. Skin for abnormal spots. Counseling Your health care provider may ask you questions about your: Past medical problems. Family's medical history. Alcohol, tobacco, and drug use. Emotional well-being. Home life and relationship well-being. Sexual activity. Diet, exercise, and sleep habits. Work and work Statistician. Access to firearms. What immunizations do I need? Vaccines are usually given at various ages, according to a schedule. Your health care provider will recommend vaccines for you based on your age, medical history, and lifestyle or other factors, such as travel or where you work. What tests do I need? Blood tests Lipid and cholesterol levels. These may be checked every 5 years, or more often if you are over 63 years old. Hepatitis C test. Hepatitis B test. Screening Lung cancer screening. You may have this screening every year starting at age 104 if you have a 30-pack-year history of smoking and currently smoke or have quit within the past 15 years. Prostate cancer screening. Recommendations will vary depending on your family history and other risks. Genital exam to check for testicular cancer or hernias. Colorectal cancer screening. All adults should have this screening starting at age 15 and continuing until age 20. Your health care provider may recommend screening  at age 16 if you are at increased risk. You will have tests every 1-10 years, depending on your results and the type of screening test. Diabetes screening. This is done by checking your blood sugar (glucose) after you have not eaten for a while (fasting). You may have this done every 1-3 years. STD (sexually transmitted disease) testing, if you are at risk. Follow these instructions at home: Eating and drinking  Eat a diet that includes fresh fruits and vegetables, whole grains, lean protein, and low-fat dairy products. Take vitamin and mineral supplements as recommended by your health care provider. Do not drink alcohol if your health care provider tells you not to drink. If you drink alcohol: Limit how much you have to 0-2 drinks a day. Be aware of how much alcohol is in your drink. In the [place] one drink equals one 12 oz bottle of beer (355 mL), one 5 oz glass of wine (148 mL), or one 1 oz glass of hard liquor (44 mL). Lifestyle Take daily care of your teeth and gums. Brush your teeth every morning and night with fluoride toothpaste. Floss one time each day. Stay active. Exercise for at least 30 minutes 5 or more days each week. Do not use any products that contain nicotine or tobacco, such as cigarettes, e-cigarettes, and chewing tobacco. If you need help quitting, ask your health care provider. Do not use drugs. If you are sexually active, practice safe sex. Use a condom or other form of protection to prevent STIs (sexually transmitted infections). If told by your health care provider, take low-dose aspirin daily starting at age 43. Find healthy ways to cope with stress, such as: Meditation, yoga, or listening to music. Journaling. Talking to a trusted person. Spending time with friends and family. Safety Always wear your seat belt while driving or riding in a vehicle. Do not drive: If you have been drinking alcohol. Do not ride with someone who has been drinking. When you are  tired or distracted. While texting. Wear a helmet and other protective equipment during sports activities. If you have firearms in your house, make sure you follow all gun safety procedures. What's next? Go to your health care provider once a year for an annual wellness visit. Ask your health care provider  how often you should have your eyes and teeth checked. Stay up to date on all vaccines. This information is not intended to replace advice given to you by your health care provider. Make sure you discuss any questions you have with your health care provider. Document Revised: 04/07/2020 Document Reviewed: 01/22/2018 Elsevier Patient Education  2022 [place],   Merri Ray, MD [place], [facility] 12/18/20 2:53 PM

## 2020-12-19 ENCOUNTER — Ambulatory Visit: Payer: BC Managed Care – PPO | Attending: Family

## 2020-12-19 DIAGNOSIS — Z23 Encounter for immunization: Secondary | ICD-10-CM

## 2020-12-19 NOTE — Telephone Encounter (Signed)
Please see prior message.  If he is continuing to require Celebrex, needs office visit to discuss plan.  I did prescribe temporary refill but should decrease to 200 mg daily at this point.  Let me know if there are questions.

## 2020-12-25 ENCOUNTER — Ambulatory Visit (INDEPENDENT_AMBULATORY_CARE_PROVIDER_SITE_OTHER): Payer: BC Managed Care – PPO | Admitting: Family Medicine

## 2020-12-25 ENCOUNTER — Ambulatory Visit: Payer: BC Managed Care – PPO

## 2020-12-25 DIAGNOSIS — E291 Testicular hypofunction: Secondary | ICD-10-CM | POA: Diagnosis not present

## 2020-12-25 NOTE — Progress Notes (Signed)
   Covid-19 Vaccination Clinic  Name:  EMARI DEMMER    MRN: [id number] DOB: [birth year]  12/25/2020  Mr. Rodwell was observed post Covid-19 immunization for 15 minutes without incident. He was provided with Vaccine Information Sheet and instruction to access the V-Safe system.   Mr. Strozier was instructed to call 911 with any severe reactions post vaccine: Difficulty breathing  Swelling of face and throat  A fast heartbeat  A bad rash all over body  Dizziness and weakness

## 2020-12-25 NOTE — Progress Notes (Addendum)
Patient was here for his bi-weekly testosterone injection in the LUQ. Patient tolerated the medication well. Patient supplies his own testosterone medication.  NOH:[id number] Lot #: [id number] Exp date : 07/2023

## 2021-01-08 ENCOUNTER — Ambulatory Visit: Payer: BC Managed Care – PPO

## 2021-01-09 ENCOUNTER — Ambulatory Visit: Payer: BC Managed Care – PPO

## 2021-01-10 ENCOUNTER — Ambulatory Visit (INDEPENDENT_AMBULATORY_CARE_PROVIDER_SITE_OTHER): Payer: BC Managed Care – PPO | Admitting: Family Medicine

## 2021-01-10 DIAGNOSIS — E291 Testicular hypofunction: Secondary | ICD-10-CM | POA: Diagnosis not present

## 2021-01-10 NOTE — Progress Notes (Signed)
Patient came in office for a nurse visit and received his Testosterone in RUQ per Dr. Carlota Raspberry. Patient tolerated the medication well and will return in about 2 weeks.  Testosterone Cypionate Injection 200 mg/ml NDC:0143-9659-01 Lot:[id number] Expiration Date:08/11/2023

## 2021-01-15 ENCOUNTER — Inpatient Hospital Stay: Payer: BC Managed Care – PPO | Attending: Genetic Counselor | Admitting: Genetic Counselor

## 2021-01-15 ENCOUNTER — Other Ambulatory Visit: Payer: Self-pay

## 2021-01-15 ENCOUNTER — Encounter: Payer: Self-pay | Admitting: Genetic Counselor

## 2021-01-15 ENCOUNTER — Inpatient Hospital Stay: Payer: BC Managed Care – PPO

## 2021-01-15 DIAGNOSIS — Z808 Family history of malignant neoplasm of other organs or systems: Secondary | ICD-10-CM | POA: Diagnosis not present

## 2021-01-15 DIAGNOSIS — Z8042 Family history of malignant neoplasm of prostate: Secondary | ICD-10-CM | POA: Diagnosis not present

## 2021-01-15 DIAGNOSIS — Z8041 Family history of malignant neoplasm of ovary: Secondary | ICD-10-CM

## 2021-01-15 DIAGNOSIS — Z800 Family history of malignant neoplasm of digestive organs: Secondary | ICD-10-CM

## 2021-01-15 HISTORY — DX: Family history of malignant neoplasm of digestive organs: Z80.0

## 2021-01-15 HISTORY — DX: Family history of malignant neoplasm of ovary: Z80.41

## 2021-01-15 HISTORY — DX: Family history of malignant neoplasm of prostate: Z80.42

## 2021-01-15 LAB — GENETIC SCREENING ORDER

## 2021-01-15 NOTE — Progress Notes (Signed)
REFERRING PROVIDER: Wendie Agreste, MD [address] [place],  [place] [address]  PRIMARY PROVIDER:  Wendie Agreste, MD  PRIMARY REASON FOR VISIT:  1. Family history of pancreatic cancer   2. Family history of ovarian cancer   3. Family history of brain cancer   4. Family history of prostate cancer      HISTORY OF PRESENT ILLNESS:   Marcus Gutierrez, a 60 y.o. male, was seen for a [place] cancer genetics consultation at the request of Dr. Carlota Raspberry due to a family history of cancer.  Marcus Gutierrez presents to clinic today to discuss the possibility of a hereditary predisposition to cancer, to discuss genetic testing, and to further clarify his future cancer risks, as well as potential cancer risks for family members.   Marcus Gutierrez is a 60 y.o. male with no personal history of cancer.    CANCER HISTORY:  Oncology History   No history exists.     RISK FACTORS:  Colonoscopy: yes;  every 2 years per patient . Prostate cancer screening: PSA every 2 years Any excessive radiation exposure in the past: no  Past Medical History:  Diagnosis Date   Allergy    Family history of ovarian cancer 01/15/2021   Family history of pancreatic cancer 01/15/2021   Family history of prostate cancer 01/15/2021   GERD (gastroesophageal reflux disease)    Seizures ([facility])    Ulcer    Ulcerative colitis ([place])     Past Surgical History:  Procedure Laterality Date   HERNIA REPAIR      Social History   Socioeconomic History   Marital status: Unknown    Spouse name: Not on file   Number of children: Not on file   Years of education: Not on file   Highest education level: Not on file  Occupational History   Not on file  Tobacco Use   Smoking status: Former    Types: Cigarettes    Quit date: 06/01/2003    Years since quitting: 17.6   Smokeless tobacco: Never  Vaping Use   Vaping Use: Never used  Substance and Sexual Activity   Alcohol use: Yes    Comment: rare occasion   Drug use: No    Sexual activity: Yes  Other Topics Concern   Not on file  Social History Narrative   Not on file   Social Determinants of Health   Financial Resource Strain: Not on file  Food Insecurity: Not on file  Transportation Needs: Not on file  Physical Activity: Not on file  Stress: Not on file  Social Connections: Not on file     FAMILY HISTORY:  We obtained a detailed, 4-generation family history.  Significant diagnoses are listed below: Family History  Problem Relation Age of Onset   Pancreatic cancer Mother 25   Brain cancer Maternal Uncle 18   Prostate cancer Paternal Uncle        dx 54s   Ovarian cancer Maternal Grandmother        dx 57s   Bladder Cancer Paternal Grandmother 82   Colon cancer Other 51       MGM's sister    Mr. Drewes is unaware of previous family history of genetic testing for hereditary cancer risks. There is no known consanguinity.  GENETIC COUNSELING ASSESSMENT: Marcus Gutierrez is a 60 y.o. male with a family history of cancer which is somewhat suggestive of a hereditary cancer syndrome and predisposition to cancer given the presence of  related cancers in his maternal family. We, therefore, discussed and recommended the following at today's visit.   DISCUSSION: We discussed that 5 - 10% of cancer is hereditary, with most cases of hereditary pancreatic cancer associated with mutations in the BRCA1/2.  There are other genes that can be associated with hereditary pancreatic cancer syndromes.  These include but are not limited to the Lynch syndrome genes.  We discussed that testing is beneficial for several reasons, including knowing about other cancer risks, identifying potential screening and risk-reduction options that may be appropriate, and to understanding if other family members could be at risk for cancer and allowing them to undergo genetic testing.  We reviewed the characteristics, features and inheritance patterns of hereditary cancer syndromes. We also  discussed genetic testing, including the appropriate family members to test, the process of testing, insurance coverage and turn-around-time for results. We discussed the implications of a negative, positive, carrier and/or variant of uncertain significant result. We discussed that negative results would be uninformative given that Marcus Gutierrez does not have a personal history of cancer. We recommended Marcus Gutierrez pursue genetic testing for a panel that contains genes associated with pancreatic, ovarian, colon, and brain cancers.  The CancerNext-Expanded gene panel offered by [facility] and includes sequencing, rearrangement, and RNA analysis for the following 77 genes: AIP, ALK, APC, ATM, AXIN2, BAP1, BARD1, BLM, BMPR1A, BRCA1, BRCA2, BRIP1, CDC73, CDH1, CDK4, CDKN1B, CDKN2A, CHEK2, CTNNA1, DICER1, FANCC, FH, FLCN, GALNT12, KIF1B, LZTR1, MAX, MEN1, MET, MLH1, MSH2, MSH3, MSH6, MUTYH, NBN, NF1, NF2, NTHL1, PALB2, PHOX2B, PMS2, POT1, PRKAR1A, PTCH1, PTEN, RAD51C, RAD51D, RB1, RECQL, RET, SDHA, SDHAF2, SDHB, SDHC, SDHD, SMAD4, SMARCA4, SMARCB1, SMARCE1, STK11, SUFU, TMEM127, TP53, TSC1, TSC2, VHL and XRCC2 (sequencing and deletion/duplication); EGFR, EGLN1, HOXB13, KIT, MITF, PDGFRA, POLD1, and POLE (sequencing only); EPCAM and GREM1 (deletion/duplication only).   Based on Marcus Gutierrez's family history of cancer, he meets medical criteria for genetic testing. Despite that he meets criteria, he may still have an out of pocket cost. We discussed that if his out of pocket cost for testing is over $100, the laboratory should contact him to discuss self-pay options and/or patient pay assistance programs.   We discussed that some people do not want to undergo genetic testing due to fear of genetic discrimination.  A federal law called the Genetic Information Non-Discrimination Act (GINA) of 2008 helps protect individuals against genetic discrimination based on their genetic test results.  It impacts both health  insurance and employment.  With health insurance, it protects against increased premiums, being kicked off insurance or being forced to take a test in order to be insured.  For employment it protects against hiring, firing and promoting decisions based on genetic test results.  GINA does not apply to those in the [employer], those who work for companies with less than 15 employees, and new life insurance or long-term disability insurance policies.  Health status due to a cancer diagnosis is not protected under GINA.  PLAN: After considering the risks, benefits, and limitations, Marcus Gutierrez provided informed consent to pursue genetic testing and the blood sample was sent to [facility] for analysis of the CancerNext-Expanded +RNAinsight Panel. Results should be available within approximately 3 weeks' time, at which point they will be disclosed by telephone to Marcus Gutierrez, as will any additional recommendations warranted by these results. Marcus Gutierrez will receive a summary of his genetic counseling visit and a copy of his results once available. This information will also be available in  Epic.   Lastly, we encouraged Marcus Gutierrez to remain in contact with cancer genetics annually so that we can continuously update the family history and inform him of any changes in cancer genetics and testing that may be of benefit for this family.   Marcus Gutierrez questions were answered to his satisfaction today. Our contact information was provided should additional questions or concerns arise. Thank you for the referral and allowing [place] to share in the care of your patient.   Terriona Horlacher M. Joette Catching, [place], [facility] Genetic Counselor [email or web address] (P) [phone]   The patient was seen for a total of 30 minutes in face-to-face genetic counseling.  The patient was seen alone.  Drs. Magrinat, Lindi Adie and/or Burr Medico were available to discuss this case as needed.   _______________________________________________________________________ For Office Staff:  Number of people involved in session: 1 Was an Intern/ student involved with case: no

## 2021-01-22 ENCOUNTER — Ambulatory Visit: Payer: BC Managed Care – PPO

## 2021-01-23 ENCOUNTER — Ambulatory Visit (INDEPENDENT_AMBULATORY_CARE_PROVIDER_SITE_OTHER): Payer: BC Managed Care – PPO | Admitting: Registered Nurse

## 2021-01-23 DIAGNOSIS — E291 Testicular hypofunction: Secondary | ICD-10-CM | POA: Diagnosis not present

## 2021-01-23 NOTE — Progress Notes (Signed)
Pt here for testosterone injection, receives 1 mL every 2 weeks, rotated injection site today to Lt side, well tolerated

## 2021-01-25 ENCOUNTER — Other Ambulatory Visit: Payer: Self-pay | Admitting: Family Medicine

## 2021-01-25 DIAGNOSIS — E291 Testicular hypofunction: Secondary | ICD-10-CM

## 2021-01-26 NOTE — Telephone Encounter (Signed)
Controlled substance database (PDMP) reviewed. No concerns appreciated.  62ml filled 11/09/20.  200 mg injection every 2 weeks.  Last injection on 01/23/2021.   Last discussed October 27.  Testosterone level stable at that time. Refill ordered.

## 2021-02-05 ENCOUNTER — Encounter: Payer: Self-pay | Admitting: Family Medicine

## 2021-02-06 ENCOUNTER — Ambulatory Visit: Payer: BC Managed Care – PPO

## 2021-02-13 ENCOUNTER — Encounter: Payer: Self-pay | Admitting: Genetic Counselor

## 2021-02-13 ENCOUNTER — Telehealth: Payer: Self-pay | Admitting: Genetic Counselor

## 2021-02-13 ENCOUNTER — Ambulatory Visit: Payer: Self-pay | Admitting: Genetic Counselor

## 2021-02-13 DIAGNOSIS — Z8041 Family history of malignant neoplasm of ovary: Secondary | ICD-10-CM

## 2021-02-13 DIAGNOSIS — Z1379 Encounter for other screening for genetic and chromosomal anomalies: Secondary | ICD-10-CM

## 2021-02-13 DIAGNOSIS — Z800 Family history of malignant neoplasm of digestive organs: Secondary | ICD-10-CM

## 2021-02-13 DIAGNOSIS — Z808 Family history of malignant neoplasm of other organs or systems: Secondary | ICD-10-CM

## 2021-02-13 DIAGNOSIS — Z8042 Family history of malignant neoplasm of prostate: Secondary | ICD-10-CM

## 2021-02-13 NOTE — Progress Notes (Signed)
HPI:   Marcus Gutierrez was previously seen in the [facility] clinic due to a family history of pancreatic, ovarian, and other cancers and concerns regarding a hereditary predisposition to cancer. Please refer to our prior cancer genetics clinic note for more information regarding our discussion, assessment and recommendations, at the time. Marcus Gutierrez recent genetic test results were disclosed to him, as were recommendations warranted by these results. These results and recommendations are discussed in more detail below.  CANCER HISTORY:  Oncology History   No history exists.    FAMILY HISTORY:  We obtained a detailed, 4-generation family history.  Significant diagnoses are listed below:      Family History  Problem Relation Age of Onset   Pancreatic cancer Mother 25   Brain cancer Maternal Uncle 18   Prostate cancer Paternal Uncle          dx 62s   Ovarian cancer Maternal Grandmother          dx 17s   Bladder Cancer Paternal Grandmother 82   Colon cancer Other 68        MGM's sister    Marcus Gutierrez is unaware of previous family history of genetic testing for hereditary cancer risks. There is no known consanguinity.    GENETIC TEST RESULTS:  The [facility] CancerNext-Expanded +RNAinsight Panel found no pathogenic mutations. The CancerNext-Expanded gene panel offered by [facility] and includes sequencing, rearrangement, and RNA analysis for the following 77 genes: AIP, ALK, APC, ATM, AXIN2, BAP1, BARD1, BLM, BMPR1A, BRCA1, BRCA2, BRIP1, CDC73, CDH1, CDK4, CDKN1B, CDKN2A, CHEK2, CTNNA1, DICER1, FANCC, FH, FLCN, GALNT12, KIF1B, LZTR1, MAX, MEN1, MET, MLH1, MSH2, MSH3, MSH6, MUTYH, NBN, NF1, NF2, NTHL1, PALB2, PHOX2B, PMS2, POT1, PRKAR1A, PTCH1, PTEN, RAD51C, RAD51D, RB1, RECQL, RET, SDHA, SDHAF2, SDHB, SDHC, SDHD, SMAD4, SMARCA4, SMARCB1, SMARCE1, STK11, SUFU, TMEM127, TP53, TSC1, TSC2, VHL and XRCC2 (sequencing and deletion/duplication); EGFR, EGLN1, HOXB13, KIT, MITF, PDGFRA,  POLD1, and POLE (sequencing only); EPCAM and GREM1 (deletion/duplication only).   The test report has been scanned into EPIC and is located under the Molecular Pathology section of the Results Review tab.  A portion of the result report is included below for reference. Genetic testing reported out on February 13, 2021.       Even though a pathogenic variant was not identified, possible explanations for the cancer in the family may include: There may be no hereditary risk for cancer in the family. The cancers in Marcus Gutierrez's family may be sporadic/familial or due to other genetic and environmental factors. There may be a gene mutation in one of these genes that current testing methods cannot detect but that chance is small. There could be another gene that has not yet been discovered, or that we have not yet tested, that is responsible for the cancer diagnoses in the family.  It is also possible there is a hereditary cause for the cancer in the family that Marcus Gutierrez did not inherit.   Therefore, it is important to remain in touch with cancer genetics in the future so that we can continue to offer Marcus Gutierrez the most up to date genetic testing.    ADDITIONAL GENETIC TESTING:  We discussed with Marcus Gutierrez that his genetic testing was fairly extensive.  If there are genes identified to increase cancer risk that can be analyzed in the future, we would be happy to discuss and coordinate this testing at that time.    CANCER SCREENING RECOMMENDATIONS:  Marcus Gutierrez test result  is considered negative (normal).  This means that we have not identified a hereditary cause for his family history of cancer at this time.  An individual's cancer risk and medical management are not determined by genetic test results alone. Overall cancer risk assessment incorporates additional factors, including personal medical history, family history, and any available genetic information that may result in a personalized  plan for cancer prevention and surveillance. Therefore, it is recommended he continue to follow the cancer management and screening guidelines provided by his primary healthcare provider.  RECOMMENDATIONS FOR FAMILY MEMBERS:   Since he did not inherit a identifiable mutation in a cancer predisposition gene included on this panel, his children could not have inherited a known mutation from him in one of these genes. Individuals in this family might be at some increased risk of developing cancer, over the general population risk, due to the family history of cancer.  Individuals in the family should notify their providers of the family history of cancer. We recommend women in this family have a yearly mammogram beginning at age 67, or 56 years younger than the earliest onset of cancer, an annual clinical breast exam, and perform monthly breast self-exams.  Family members should have colonoscopies by at age 94, or earlier, as recommended by their providers.  Other members of the family may still carry a pathogenic variant in one of these genes that Marcus Gutierrez did not inherit. Based on the family history of pancreatic cancer in his deceased mother, we recommend his siblings have genetic counseling and testing. Marcus Gutierrez will let us know if we can be of any assistance in coordinating genetic counseling and/or testing for these family members.     FOLLOW-UP:  Lastly, we discussed with Marcus Gutierrez that cancer genetics is a rapidly advancing field and it is possible that new genetic tests will be appropriate for him and/or his family members in the future. We encouraged him to remain in contact with cancer genetics on an annual basis so we can update his personal and family histories and let him know of advances in cancer genetics that may benefit this family.   Our contact number was provided. Marcus Gutierrez questions were answered to his satisfaction, and he knows he is welcome to call us at anytime with  additional questions or concerns.   Felcia Huebert M. Joette Catching, [place], [facility] Genetic Counselor [email or web address] (P) [phone]

## 2021-02-13 NOTE — Telephone Encounter (Signed)
Revealed negative genetic testing.  Discussed that we do not know why there is cancer in the family. It could be familial, due to a change in a gene that he did not inherit, due to a different gene that we are not testing, or maybe our current technology may not be able to pick something up.  It will be important for him to keep in contact with genetics to keep up with whether additional testing may be needed.  Recommended genetic testing for his siblings.

## 2021-02-19 ENCOUNTER — Ambulatory Visit (INDEPENDENT_AMBULATORY_CARE_PROVIDER_SITE_OTHER): Payer: BC Managed Care – PPO | Admitting: Family Medicine

## 2021-02-19 DIAGNOSIS — E291 Testicular hypofunction: Secondary | ICD-10-CM | POA: Diagnosis not present

## 2021-02-19 NOTE — Progress Notes (Signed)
Pt was given 1 mL testosterone injection without issues. Pt will return in 2 weeks

## 2021-03-05 ENCOUNTER — Ambulatory Visit (INDEPENDENT_AMBULATORY_CARE_PROVIDER_SITE_OTHER): Payer: BC Managed Care – PPO | Admitting: Family Medicine

## 2021-03-05 DIAGNOSIS — E291 Testicular hypofunction: Secondary | ICD-10-CM

## 2021-03-05 NOTE — Progress Notes (Addendum)
Patient was here to receive testosterone injection in LUQ. Patient tolerated well and will return in 2 weeks .

## 2021-03-19 ENCOUNTER — Ambulatory Visit (INDEPENDENT_AMBULATORY_CARE_PROVIDER_SITE_OTHER): Payer: BC Managed Care – PPO | Admitting: Family Medicine

## 2021-03-19 ENCOUNTER — Ambulatory Visit: Payer: BC Managed Care – PPO

## 2021-03-19 DIAGNOSIS — E291 Testicular hypofunction: Secondary | ICD-10-CM

## 2021-03-19 NOTE — Progress Notes (Signed)
Marcus Gutierrez is a 61 y.o. male presents to the office today for testosterone injections, per physician's orders.   [name]

## 2021-04-02 ENCOUNTER — Ambulatory Visit (INDEPENDENT_AMBULATORY_CARE_PROVIDER_SITE_OTHER): Payer: BC Managed Care – PPO | Admitting: Family Medicine

## 2021-04-02 DIAGNOSIS — E291 Testicular hypofunction: Secondary | ICD-10-CM

## 2021-04-02 NOTE — Progress Notes (Signed)
Patient came into the office for Testosterone injection in the LUQ Per Dr. Neva Seat. Patient tolerated the medication well.

## 2021-04-16 ENCOUNTER — Other Ambulatory Visit: Payer: Self-pay

## 2021-04-16 ENCOUNTER — Ambulatory Visit (INDEPENDENT_AMBULATORY_CARE_PROVIDER_SITE_OTHER): Payer: BC Managed Care – PPO | Admitting: Family Medicine

## 2021-04-16 DIAGNOSIS — E291 Testicular hypofunction: Secondary | ICD-10-CM

## 2021-04-16 MED ORDER — TESTOSTERONE CYPIONATE 200 MG/ML IM SOLN: 200.0000 mg | INTRAMUSCULAR | 0 refills | Status: DC

## 2021-04-16 NOTE — Progress Notes (Signed)
Pt here for biweekly injection, pt has been advised due for follow up soon and notes he does have follow up scheduled for late April. No concerns today  ?

## 2021-04-16 NOTE — Telephone Encounter (Signed)
While pt was in for injection he has also requested a refill on injectable he is set for Office visit to review on 06/07/2021  ?

## 2021-04-16 NOTE — Telephone Encounter (Signed)
Refills ordered, keep follow-up as planned. ?

## 2021-04-26 LAB — HM COLONOSCOPY

## 2021-04-30 ENCOUNTER — Ambulatory Visit (INDEPENDENT_AMBULATORY_CARE_PROVIDER_SITE_OTHER): Payer: BC Managed Care – PPO | Admitting: Family Medicine

## 2021-04-30 DIAGNOSIS — E291 Testicular hypofunction: Secondary | ICD-10-CM

## 2021-04-30 MED ORDER — TESTOSTERONE CYPIONATE 200 MG/ML IM SOLN
200.0000 mg | INTRAMUSCULAR | Status: DC
  Administered 2021-04-30 – 2021-07-11 (×5): 200 mg via INTRAMUSCULAR

## 2021-04-30 NOTE — Progress Notes (Signed)
Marcus Gutierrez is a 61 y.o. male presents to the office today for testosterone injection, per physician's orders. ? ? ?Juliann Pulse ? ?

## 2021-05-14 ENCOUNTER — Ambulatory Visit: Payer: BC Managed Care – PPO

## 2021-05-16 ENCOUNTER — Ambulatory Visit (INDEPENDENT_AMBULATORY_CARE_PROVIDER_SITE_OTHER): Payer: BC Managed Care – PPO | Admitting: Family Medicine

## 2021-05-16 DIAGNOSIS — E291 Testicular hypofunction: Secondary | ICD-10-CM | POA: Diagnosis not present

## 2021-05-16 NOTE — Progress Notes (Signed)
Patient came in office to get his Testosterone injection in his RUQ. Patient tolerated the injection well. ?

## 2021-05-28 ENCOUNTER — Ambulatory Visit (INDEPENDENT_AMBULATORY_CARE_PROVIDER_SITE_OTHER): Payer: BC Managed Care – PPO | Admitting: Family Medicine

## 2021-05-28 DIAGNOSIS — E291 Testicular hypofunction: Secondary | ICD-10-CM

## 2021-05-28 NOTE — Progress Notes (Signed)
Patient was in office to receive his Testosterone injection on LUQ. Patient tolerated the medication well. ?

## 2021-06-07 ENCOUNTER — Ambulatory Visit: Payer: BC Managed Care – PPO | Admitting: Family Medicine

## 2021-06-07 ENCOUNTER — Encounter: Payer: Self-pay | Admitting: Family Medicine

## 2021-06-07 ENCOUNTER — Other Ambulatory Visit: Payer: Self-pay

## 2021-06-07 VITALS — BP 128/84 | HR 89 | Temp 98.0°F | Resp 18 | Ht 69.0 in | Wt 166.0 lb

## 2021-06-07 DIAGNOSIS — E291 Testicular hypofunction: Secondary | ICD-10-CM | POA: Diagnosis not present

## 2021-06-07 DIAGNOSIS — K50119 Crohn's disease of large intestine with unspecified complications: Secondary | ICD-10-CM

## 2021-06-07 NOTE — Progress Notes (Signed)
? ?Subjective:  ?Patient ID: Marcus Gutierrez, male    DOB: 1960-06-03  Age: 61 y.o. MRN: [id number] ? ?CC:  ?Chief Complaint  ?Patient presents with  ? Follow-up  ?  Patient states he is here for a 6 month follow up and lab work.  ? ? ?HPI ?Marcus Gutierrez presents for  ? ?Hypogonadism ?Treated with testosterone injections.  200 mg every 2 weeks.  Last injection given 10 days ago.  ?Had testosterone level labs at urology on 05/22/2021. Results on phone: ?Total testosterone 849 (6 days after prior injection).  PSA 1.48, hemoglobin 15.6, hematocrit is 47.3. working well on this dose. Followed by urology for PSA 5.16 in 11/2020. Appt with urology 5/11-12 treatment for peyronie's then 6 months visit.  ? Lipids normal in 11/2020. Normal prior.  ?Denies any new side effects.  He has used sildenafil 129m for erectile dysfunction and has been followed by urology previously.  Denies any vision or hearing changes or chest pain with exertion with this medication. Min HA at times with use, not every time.  ? ? ?Other labs above.  ?Lab Results  ?Component Value Date  ? WBC 6.8 12/07/2020  ? HGB 15.3 12/07/2020  ? HCT 46.7 12/07/2020  ? MCV 90.4 12/07/2020  ? PLT 310.0 12/07/2020  ? ?Lab Results  ?Component Value Date  ? PSA1 0.7 03/13/2020  ? PSA1 1.2 04/14/2018  ? PSA1 2.4 07/03/2016  ? PSA 5.16 (H) 12/07/2020  ? PSA 1.58 07/15/2015  ? PSA 1.67 02/13/2015  ? ?Lab Results  ?Component Value Date  ? TESTOSTERONE 414.90 12/07/2020  ? ?Lab Results  ?Component Value Date  ? CHOL 150 12/07/2020  ? HDL 57.20 12/07/2020  ? [place]75 12/07/2020  ? TRIG 91.0 12/07/2020  ? CHOLHDL 3 12/07/2020  ? ?Chron's disease ?Followed by Dr.Shahid at [facility]  Treated with Humira, on omeprazole for GERD and vitamin D supplementation, azathioprine started and some monitioring labs recently.  Colonoscopy last year.  High-fiber diet. ? ? ?History ?Patient Active Problem List  ? Diagnosis Date Noted  ? Genetic testing 02/13/2021  ? Family history  of pancreatic cancer 01/15/2021  ? Family history of ovarian cancer 01/15/2021  ? Family history of brain cancer 01/15/2021  ? Family history of prostate cancer 01/15/2021  ? Acne rosacea, papular type 10/26/2020  ? Depression 03/05/2015  ? Rising PSA level 02/13/2015  ? Colitis 12/11/2011  ? Hypogonadism male 11/29/2011  ? ?Past Medical History:  ?Diagnosis Date  ? Allergy   ? Family history of ovarian cancer 01/15/2021  ? Family history of pancreatic cancer 01/15/2021  ? Family history of prostate cancer 01/15/2021  ? GERD (gastroesophageal reflux disease)   ? Seizures ([place]   ? Ulcer   ? Ulcerative colitis ([place]   ? ?Past Surgical History:  ?Procedure Laterality Date  ? HERNIA REPAIR    ? ?Allergies  ?Allergen Reactions  ? Amoxicillin Other (See Comments)  ?  hematochezia  ? Androgel [Testosterone]   ? ?Prior to Admission medications   ?Medication Sig Start Date End Date Taking? Authorizing Provider  ?cetirizine (ZYRTEC) 10 MG tablet Take 1 tablet (10 mg total) by mouth daily. 12/17/18  Yes SJacelyn Pi ILilia Argue MD  ?fluticasone ([facility] 50 MCG/ACT nasal spray Place 1 spray into both nostrils 2 (two) times daily. 12/17/18  Yes SJacelyn Pi ILilia Argue MD  ?[facility] 80 MG/0.8ML PNKT Inject into the skin. 07/29/19  Yes [provider]  ?PAZEO 0.7 % SOLN  Place 1 drop into both eyes daily as needed. 07/03/16  Yes Forrest Moron, MD  ?testosterone cypionate (DEPOTESTOSTERONE CYPIONATE) 200 MG/ML injection Inject 1 mL (200 mg total) into the muscle every 14 (fourteen) days. 04/16/21  Yes Wendie Agreste, MD  ?celecoxib (CELEBREX) 200 MG capsule Take 1 capsule (200 mg total) by mouth daily. ?Patient not taking: Reported on 06/07/2021 12/19/20   Wendie Agreste, MD  ? ?Social History  ? ?Socioeconomic History  ? Marital status: Unknown  ?  Spouse name: Not on file  ? Number of children: Not on file  ? Years of education: Not on file  ? Highest education level: Not on file  ?Occupational History  ?  Not on file  ?Tobacco Use  ? Smoking status: Former  ?  Types: Cigarettes  ?  Quit date: 06/01/2003  ?  Years since quitting: 18.0  ? Smokeless tobacco: Never  ?Vaping Use  ? Vaping Use: Never used  ?Substance and Sexual Activity  ? Alcohol use: Yes  ?  Comment: rare occasion  ? Drug use: No  ? Sexual activity: Yes  ?Other Topics Concern  ? Not on file  ?Social History Narrative  ? Not on file  ? ?Social Determinants of Health  ? ?Financial Resource Strain: Not on file  ?Food Insecurity: Not on file  ?Transportation Needs: Not on file  ?Physical Activity: Not on file  ?Stress: Not on file  ?Social Connections: Not on file  ?Intimate Partner Violence: Not on file  ? ? ?Review of Systems ?Per HPI.  ? ?Objective:  ? ?Vitals:  ? 06/07/21 1325  ?BP: 128/84  ?Pulse: 89  ?Resp: 18  ?Temp: 98 ?F (36.7 ?C)  ?SpO2: 98%  ?Weight: 166 lb (75.3 kg)  ?Height: 5' 9"  (1.753 m)  ? ? ? ?Physical Exam ?Vitals reviewed.  ?Constitutional:   ?   Appearance: He is well-developed.  ?HENT:  ?   Head: Normocephalic and atraumatic.  ?Neck:  ?   Vascular: No carotid bruit or JVD.  ?Cardiovascular:  ?   Rate and Rhythm: Normal rate and regular rhythm.  ?   Heart sounds: Normal heart sounds. No murmur heard. ?Pulmonary:  ?   Effort: Pulmonary effort is normal.  ?   Breath sounds: Normal breath sounds. No rales.  ?Musculoskeletal:  ?   Right lower leg: No edema.  ?   Left lower leg: No edema.  ?Skin: ?   General: Skin is warm and dry.  ?Neurological:  ?   Mental Status: He is alert and oriented to person, place, and time.  ?Psychiatric:     ?   Mood and Affect: Mood normal.  ? ? ? ? ? ?Assessment & Plan:  ?Marcus Gutierrez is a 61 y.o. male . ?Hypogonadism male ? -With erectile dysfunction, Peyronie's by report, followed by urology, recent labs noted.  Up-to-date on screening labs, will defer for now.  Recheck in 6 months, and can check labs at that time unless he has had lab work performed by urology.  No doses changes for now. ? ?Crohn's disease  of colon with complication  ? -Ongoing follow-up with gastroenterology, new medication started as above with reported blood work monitoring completed.  No new labs at this time.  22-monthfollow-up for physical. ? ?No orders of the defined types were placed in this encounter. ? ?Patient Instructions  ? ? ? ? ?If you have lab work done today you will be contacted with your lab  results within the next 2 weeks.  If you have not heard from [place] then please contact us. The fastest way to get your results is to register for My Chart. ? ? ?IF you received an x-ray today, you will receive an invoice from [facility]. Please contact [facility] at [phone] with questions or concerns regarding your invoice.  ? ?IF you received labwork today, you will receive an invoice from [facility]. Please contact [facility] at [phone] with questions or concerns regarding your invoice.  ? ?Our billing staff will not be able to assist you with questions regarding bills from these companies. ? ?You will be contacted with the lab results as soon as they are available. The fastest way to get your results is to activate your My Chart account. Instructions are located on the last page of this paperwork. If you have not heard from [place] regarding the results in 2 weeks, please contact this office. ?  ? ? ? ? ? ?Signed,  ? ?Merri Ray, MD ?[place], [facility] ?[facility] ?06/07/21 ?2:14 PM ? ? ?

## 2021-06-07 NOTE — Patient Instructions (Signed)
° ° ° °  If you have lab work done today you will be contacted with your lab results within the next 2 weeks.  If you have not heard from us then please contact us. The fastest way to get your results is to register for My Chart. ° ° °IF you received an x-ray today, you will receive an invoice from [facility]. Please contact [facility] at [phone] with questions or concerns regarding your invoice.  ° °IF you received labwork today, you will receive an invoice from [facility]. Please contact [facility] at [phone] with questions or concerns regarding your invoice.  ° °Our billing staff will not be able to assist you with questions regarding bills from these companies. ° °You will be contacted with the lab results as soon as they are available. The fastest way to get your results is to activate your My Chart account. Instructions are located on the last page of this paperwork. If you have not heard from us regarding the results in 2 weeks, please contact this office. °  ° ° ° °

## 2021-06-11 ENCOUNTER — Ambulatory Visit (INDEPENDENT_AMBULATORY_CARE_PROVIDER_SITE_OTHER): Payer: BC Managed Care – PPO | Admitting: Registered Nurse

## 2021-06-11 DIAGNOSIS — E291 Testicular hypofunction: Secondary | ICD-10-CM

## 2021-06-11 NOTE — Progress Notes (Signed)
Pt here for Bi weekly testosterone injection . Will return in 2 weeks for another injection . Gave injection in left upper outer quadrant . ?

## 2021-06-25 ENCOUNTER — Ambulatory Visit (INDEPENDENT_AMBULATORY_CARE_PROVIDER_SITE_OTHER): Payer: BC Managed Care – PPO | Admitting: Family Medicine

## 2021-06-25 DIAGNOSIS — E291 Testicular hypofunction: Secondary | ICD-10-CM

## 2021-06-25 NOTE — Progress Notes (Signed)
Pt was given biweekly testosterone injection, no concerns noted.  ?

## 2021-07-10 ENCOUNTER — Ambulatory Visit: Payer: BC Managed Care – PPO

## 2021-07-11 ENCOUNTER — Ambulatory Visit (INDEPENDENT_AMBULATORY_CARE_PROVIDER_SITE_OTHER): Payer: BC Managed Care – PPO | Admitting: Family Medicine

## 2021-07-11 DIAGNOSIS — E291 Testicular hypofunction: Secondary | ICD-10-CM | POA: Diagnosis not present

## 2021-07-11 NOTE — Progress Notes (Deleted)
Pt presented for Bi weekly testosterone injection, no concerns, doing well

## 2021-07-11 NOTE — Progress Notes (Signed)
Pt presents today for bi weekly testosterone injection.  No concerns from pt was administered without incident

## 2021-07-16 ENCOUNTER — Ambulatory Visit: Payer: BC Managed Care – PPO

## 2021-07-20 ENCOUNTER — Other Ambulatory Visit: Payer: Self-pay

## 2021-07-20 ENCOUNTER — Other Ambulatory Visit: Payer: Self-pay | Admitting: Family Medicine

## 2021-07-20 DIAGNOSIS — E291 Testicular hypofunction: Secondary | ICD-10-CM

## 2021-07-20 MED ORDER — TESTOSTERONE CYPIONATE 200 MG/ML IM SOLN: 200.0000 mg | INTRAMUSCULAR | 0 refills | Status: DC

## 2021-07-20 NOTE — Telephone Encounter (Signed)
Patient is requesting a refill of the following medications: Requested Prescriptions   Pending Prescriptions Disp Refills   testosterone cypionate (DEPOTESTOSTERONE CYPIONATE) 200 MG/ML injection 6 mL 0    Sig: Inject 1 mL (200 mg total) into the muscle every 14 (fourteen) days.    Date of patient request: 07/20/21 Last office visit: 07/11/21 Date of last refill: 04/16/21 Last refill amount: 6 mL

## 2021-07-21 MED ORDER — TESTOSTERONE CYPIONATE 200 MG/ML IM SOLN: 200.0000 mg | INTRAMUSCULAR | 0 refills | Status: DC

## 2021-07-21 NOTE — Telephone Encounter (Signed)
Testosterone discussed April 27 visit.  Last filled 05/16/2021.  Labs stable last October.  Also had lab work through urology on April 11, refills ordered

## 2021-07-23 ENCOUNTER — Ambulatory Visit (INDEPENDENT_AMBULATORY_CARE_PROVIDER_SITE_OTHER): Payer: BC Managed Care – PPO | Admitting: Family Medicine

## 2021-07-23 ENCOUNTER — Other Ambulatory Visit: Payer: Self-pay

## 2021-07-23 DIAGNOSIS — E291 Testicular hypofunction: Secondary | ICD-10-CM

## 2021-07-23 MED ORDER — TESTOSTERONE CYPIONATE 200 MG/ML IM SOLN
200.0000 mg | INTRAMUSCULAR | Status: DC
  Administered 2021-08-06 – 2021-10-16 (×6): 200 mg via INTRAMUSCULAR

## 2021-07-23 MED ORDER — TESTOSTERONE CYPIONATE 200 MG/ML IM SOLN: 200.0000 mg | INTRAMUSCULAR | 0 refills | Status: DC

## 2021-07-23 NOTE — Progress Notes (Unsigned)
Marcus Gutierrez is a 61 y.o. male presents to the office today for his Testosterone injection . Tolerated well . Given in Left hip.  Eli Phillips

## 2021-08-06 ENCOUNTER — Ambulatory Visit (INDEPENDENT_AMBULATORY_CARE_PROVIDER_SITE_OTHER): Payer: BC Managed Care – PPO | Admitting: Family Medicine

## 2021-08-06 DIAGNOSIS — E291 Testicular hypofunction: Secondary | ICD-10-CM

## 2021-08-10 ENCOUNTER — Encounter: Payer: Self-pay | Admitting: Family Medicine

## 2021-08-10 NOTE — Progress Notes (Signed)
Note reviewed, and agree with documentation and plan.Signed,   Merri Ray, MD [place], [facility] 08/10/21 2:00 PM

## 2021-08-20 ENCOUNTER — Ambulatory Visit (INDEPENDENT_AMBULATORY_CARE_PROVIDER_SITE_OTHER): Payer: BC Managed Care – PPO | Admitting: Family Medicine

## 2021-08-20 DIAGNOSIS — E291 Testicular hypofunction: Secondary | ICD-10-CM | POA: Diagnosis not present

## 2021-08-20 NOTE — Progress Notes (Signed)
Pt presented to the office for his normally scheduled testosterone injection no concerns

## 2021-09-03 ENCOUNTER — Ambulatory Visit (INDEPENDENT_AMBULATORY_CARE_PROVIDER_SITE_OTHER): Payer: BC Managed Care – PPO | Admitting: Family Medicine

## 2021-09-03 DIAGNOSIS — E291 Testicular hypofunction: Secondary | ICD-10-CM | POA: Diagnosis not present

## 2021-09-03 NOTE — Progress Notes (Signed)
Pt presented for testosterone injection, no concerns with last injection

## 2021-09-17 ENCOUNTER — Ambulatory Visit (INDEPENDENT_AMBULATORY_CARE_PROVIDER_SITE_OTHER): Payer: BC Managed Care – PPO | Admitting: Family Medicine

## 2021-09-17 DIAGNOSIS — E291 Testicular hypofunction: Secondary | ICD-10-CM | POA: Diagnosis not present

## 2021-09-17 NOTE — Progress Notes (Signed)
Gave 1 mL of testosterone IM in left outer upper hip Tolerated well

## 2021-10-01 ENCOUNTER — Ambulatory Visit (INDEPENDENT_AMBULATORY_CARE_PROVIDER_SITE_OTHER): Payer: BC Managed Care – PPO | Admitting: Family Medicine

## 2021-10-01 DIAGNOSIS — E291 Testicular hypofunction: Secondary | ICD-10-CM | POA: Diagnosis not present

## 2021-10-01 NOTE — Progress Notes (Signed)
Pt presented today for biweekly testosterone injection, no concerns

## 2021-10-04 ENCOUNTER — Encounter: Payer: Self-pay | Admitting: Family Medicine

## 2021-10-16 ENCOUNTER — Ambulatory Visit (INDEPENDENT_AMBULATORY_CARE_PROVIDER_SITE_OTHER): Payer: BC Managed Care – PPO | Admitting: Family Medicine

## 2021-10-16 DIAGNOSIS — Z23 Encounter for immunization: Secondary | ICD-10-CM

## 2021-10-16 DIAGNOSIS — E291 Testicular hypofunction: Secondary | ICD-10-CM | POA: Diagnosis not present

## 2021-10-16 NOTE — Progress Notes (Signed)
Pt came in and received his Testosterone injection tolerated well given in right upper outer quadrant and he received his Flu vaccine as well . Tolerated well

## 2021-10-28 ENCOUNTER — Other Ambulatory Visit: Payer: Self-pay | Admitting: Family Medicine

## 2021-10-28 DIAGNOSIS — E291 Testicular hypofunction: Secondary | ICD-10-CM

## 2021-10-29 ENCOUNTER — Ambulatory Visit: Payer: BC Managed Care – PPO

## 2021-10-29 ENCOUNTER — Telehealth: Payer: Self-pay | Admitting: Family Medicine

## 2021-10-29 DIAGNOSIS — E291 Testicular hypofunction: Secondary | ICD-10-CM

## 2021-10-29 MED ORDER — TESTOSTERONE CYPIONATE 200 MG/ML IM SOLN: 200.0000 mg | INTRAMUSCULAR | 0 refills | Status: DC

## 2021-10-29 NOTE — Telephone Encounter (Signed)
Prescription refused as duplicate order, sent earlier this morning.

## 2021-10-29 NOTE — Telephone Encounter (Signed)
Encourage patient to contact the pharmacy for refills or they can request refills through [facility]  (Please schedule appointment if patient has not been seen in over a year)    WHAT PHARMACY WOULD THEY LIKE THIS SENT TO:   [id number] [place] Testosterone cypionate injection 200 mg  [facility]/pharmacy #[id number]   Pt has appt today at 1:30 for this injection      Front office please notify patient: It takes 48-72 hours to process rx refill requests Ask patient to call pharmacy to ensure rx is ready before heading there.

## 2021-10-29 NOTE — Addendum Note (Signed)
Addended by: Carlota Raspberry, Shavanna Furnari R on: 10/29/2021 12:12 PM   Modules accepted: Orders

## 2021-10-29 NOTE — Telephone Encounter (Signed)
Controlled substance database reviewed.  Testosterone last filled 08/06/2021.  Has received monitoring labs through urology.  Refill ordered.

## 2021-10-30 ENCOUNTER — Ambulatory Visit (INDEPENDENT_AMBULATORY_CARE_PROVIDER_SITE_OTHER): Payer: BC Managed Care – PPO | Admitting: Family Medicine

## 2021-10-30 ENCOUNTER — Ambulatory Visit: Payer: BC Managed Care – PPO

## 2021-10-30 DIAGNOSIS — E291 Testicular hypofunction: Secondary | ICD-10-CM | POA: Diagnosis not present

## 2021-10-30 MED ORDER — TESTOSTERONE CYPIONATE 200 MG/ML IM SOLN
200.0000 mg | INTRAMUSCULAR | Status: DC
  Administered 2021-10-30: 200 mg via INTRAMUSCULAR

## 2021-10-30 NOTE — Progress Notes (Signed)
Pt presents today for testosterone injection administered to the Lt upper outter quadrant

## 2021-11-12 ENCOUNTER — Ambulatory Visit (INDEPENDENT_AMBULATORY_CARE_PROVIDER_SITE_OTHER): Payer: BC Managed Care – PPO | Admitting: Family Medicine

## 2021-11-12 DIAGNOSIS — E291 Testicular hypofunction: Secondary | ICD-10-CM

## 2021-11-12 NOTE — Progress Notes (Signed)
Pt presented today for testosterone injection no concerns from pt

## 2021-11-26 ENCOUNTER — Ambulatory Visit (INDEPENDENT_AMBULATORY_CARE_PROVIDER_SITE_OTHER): Payer: BC Managed Care – PPO | Admitting: Family Medicine

## 2021-11-26 DIAGNOSIS — E291 Testicular hypofunction: Secondary | ICD-10-CM | POA: Diagnosis not present

## 2021-11-26 MED ORDER — TESTOSTERONE CYPIONATE 200 MG/ML IM SOLN
200.0000 mg | INTRAMUSCULAR | Status: DC
  Administered 2021-11-26: 200 mg via INTRAMUSCULAR

## 2021-11-26 NOTE — Progress Notes (Signed)
Pt received his testosterone injection . Gave 1 mL in left upper outer quadrant . Pt tolerated the injection well and advised to return in 14 days  for another injection

## 2021-12-10 ENCOUNTER — Ambulatory Visit (INDEPENDENT_AMBULATORY_CARE_PROVIDER_SITE_OTHER): Payer: BC Managed Care – PPO | Admitting: Family Medicine

## 2021-12-10 ENCOUNTER — Encounter: Payer: Self-pay | Admitting: Family Medicine

## 2021-12-10 VITALS — BP 122/82 | HR 97 | Temp 97.1°F | Ht 69.0 in | Wt 165.2 lb

## 2021-12-10 DIAGNOSIS — Z5181 Encounter for therapeutic drug level monitoring: Secondary | ICD-10-CM

## 2021-12-10 DIAGNOSIS — R7303 Prediabetes: Secondary | ICD-10-CM

## 2021-12-10 DIAGNOSIS — E291 Testicular hypofunction: Secondary | ICD-10-CM

## 2021-12-10 DIAGNOSIS — K50119 Crohn's disease of large intestine with unspecified complications: Secondary | ICD-10-CM | POA: Diagnosis not present

## 2021-12-10 DIAGNOSIS — Z0000 Encounter for general adult medical examination without abnormal findings: Secondary | ICD-10-CM | POA: Diagnosis not present

## 2021-12-10 LAB — LIPID PANEL
Cholesterol: 153 mg/dL (ref 0–200)
HDL: 66.5 mg/dL (ref >39.00)
LDL Cholesterol: 76 mg/dL (ref 0–99)
NonHDL: 86.68
Total CHOL/HDL Ratio: 2
Triglycerides: 55 mg/dL (ref 0.0–149.0)
VLDL: 11 mg/dL (ref 0.0–40.0)

## 2021-12-10 LAB — CBC
HCT: 47.9 % (ref 39.0–52.0)
Hemoglobin: 16.3 g/dL (ref 13.0–17.0)
MCHC: 34 g/dL (ref 30.0–36.0)
MCV: 90.5 fl (ref 78.0–100.0)
Platelets: 354 10*3/uL (ref 150.0–400.0)
RBC: 5.29 Mil/uL (ref 4.22–5.81)
RDW: 14.5 % (ref 11.5–15.5)
WBC: 7.9 10*3/uL (ref 4.0–10.5)

## 2021-12-10 LAB — COMPREHENSIVE METABOLIC PANEL
ALT: 9 U/L (ref 0–53)
AST: 14 U/L (ref 0–37)
Albumin: 4.3 g/dL (ref 3.5–5.2)
Alkaline Phosphatase: 93 U/L (ref 39–117)
BUN: 10 mg/dL (ref 6–23)
CO2: 30 mEq/L (ref 19–32)
Calcium: 9.6 mg/dL (ref 8.4–10.5)
Chloride: 98 mEq/L (ref 96–112)
Creatinine, Ser: 0.99 mg/dL (ref 0.40–1.50)
GFR: 82.81 mL/min (ref >60.00)
Glucose, Bld: 93 mg/dL (ref 70–99)
Potassium: 4.4 mEq/L (ref 3.5–5.1)
Sodium: 138 mEq/L (ref 135–145)
Total Bilirubin: 0.7 mg/dL (ref 0.2–1.2)
Total Protein: 7.9 g/dL (ref 6.0–8.3)

## 2021-12-10 LAB — TESTOSTERONE: Testosterone: 371.06 ng/dL (ref 300.00–890.00)

## 2021-12-10 LAB — HEMOGLOBIN A1C: Hgb A1c MFr Bld: 6 % (ref 4.6–6.5)

## 2021-12-10 NOTE — Progress Notes (Unsigned)
Subjective:  Patient ID: Marcus Gutierrez, male    DOB: 05-28-1960  Age: 61 y.o. MRN: [id number]  CC:  Chief Complaint  Patient presents with   Annual Exam    Pt states all is well    HPI Marcus Gutierrez presents for Annual Exam PCP, me GI: Shahid at [facility], history of Crohn's colitis - doing well on Stelara. Changed from Humira - antibodies to Humira Urologist: Dr. Gloriann Loan, hx of elevated PSA - stable. Recent visit. Had PSA done and repeat visit in 6 months.  Retiring in 3 weeks.   Allergic rhinitis, treated with Flonase, Zyrtec.  Prediabetes: Eating better since last year. Less pastries. Lab Results  Component Value Date   HGBA1C 6.1 12/07/2020   Wt Readings from Last 3 Encounters:  12/10/21 165 lb 3.2 oz (74.9 kg)  06/07/21 166 lb (75.3 kg)  12/07/20 177 lb 9.6 oz (80.6 kg)    Hypogonadism Treated with testosterone injections 200 mg every 14 days.  Lab Results  Component Value Date   TESTOSTERONE 414.90 12/07/2020   Lab Results  Component Value Date   PSA1 0.7 03/13/2020   PSA1 1.2 04/14/2018   PSA1 2.4 07/03/2016   PSA 5.16 (H) 12/07/2020   PSA 1.58 07/15/2015   PSA 1.67 02/13/2015    Lab Results  Component Value Date   CHOL 150 12/07/2020   HDL 57.20 12/07/2020   LDLCALC 75 12/07/2020   TRIG 91.0 12/07/2020   CHOLHDL 3 12/07/2020   Lab Results  Component Value Date   WBC 6.8 12/07/2020   HGB 15.3 12/07/2020   HCT 46.7 12/07/2020   MCV 90.4 12/07/2020   PLT 310.0 12/07/2020           12/10/2021    1:04 PM 06/07/2021    1:27 PM 12/07/2020    2:07 PM 10/26/2020   12:19 PM 03/02/2020    2:27 PM  Depression screen PHQ 2/9  Decreased Interest 0 0 0 0 0  Down, Depressed, Hopeless 0 0 0 0 0  PHQ - 2 Score 0 0 0 0 0  Altered sleeping 1 1 0 0   Tired, decreased energy 0 1 1 0   Change in appetite 0 0 0 0   Feeling bad or failure about yourself  0 0 0 0   Trouble concentrating 0 0 0 0   Moving slowly or fidgety/restless 0 0 0 0    Suicidal thoughts 0 0 0 0   PHQ-9 Score 1 2 1  0   Difficult doing work/chores  Not difficult at all  Not difficult at all     Health Maintenance  Topic Date Due   TETANUS/TDAP  04/13/2028   COLONOSCOPY (Pts 45-34yr Insurance coverage will need to be confirmed)  04/27/2031   INFLUENZA VACCINE  Completed   COVID-19 Vaccine  Completed   Hepatitis C Screening  Completed   HIV Screening  Completed   Zoster Vaccines- Shingrix  Completed   HPV VACCINES  Aged Out  Colon CA screening with GI, followed for Crohn's colitis. Colonoscopy 01/2021.  Prostate: elevated PSA prior - followed by urology, recent visit - stable.  Lab Results  Component Value Date   PSA1 0.7 03/13/2020   PSA1 1.2 04/14/2018   PSA1 2.4 07/03/2016   PSA 5.16 (H) 12/07/2020   PSA 1.58 07/15/2015   PSA 1.67 02/13/2015      Immunization History  Administered Date(s) Administered   Influenza, Quadrivalent, Recombinant, Inj, Pf 11/25/2018  Influenza,inj,Quad PF,6+ Mos 01/05/2013, 01/24/2014, 10/18/2014, 03/02/2020, 10/26/2020, 10/16/2021   Moderna Covid-19 Vaccine Bivalent Booster 59yr & up 12/25/2020   Moderna Sars-Covid-2 Vaccination 04/22/2019, 05/25/2019   Pfizer Covid-19 Vaccine Bivalent Booster 119yr& up 09/30/2019   Pneumococcal Polysaccharide-23 04/05/2019   Td 12/12/2006   Tdap 04/14/2018   Unspecified SARS-COV-2 Vaccination 11/16/2021   Zoster Recombinat (Shingrix) 08/09/2019, 02/10/2020  Utd on vaccines.  Rsv vaccine.   No results found. Eye provider in past year. Glasses.   Dental:appt this week - q 35m74molcohol: 3 per week.   Tobacco: none.   Exercise:walking daily with active job.    History Patient Active Problem List   Diagnosis Date Noted   Genetic testing 02/13/2021   Family history of pancreatic cancer 01/15/2021   Family history of ovarian cancer 01/15/2021   Family history of brain cancer 01/15/2021   Family history of prostate cancer 01/15/2021   Acne rosacea, papular  type 10/26/2020   Depression 03/05/2015   Rising PSA level 02/13/2015   Colitis 12/11/2011   Hypogonadism male 11/29/2011   Past Medical History:  Diagnosis Date   Allergy    Family history of ovarian cancer 01/15/2021   Family history of pancreatic cancer 01/15/2021   Family history of prostate cancer 01/15/2021   GERD (gastroesophageal reflux disease)    Seizures (HCC)    Ulcer    [place]  Past Surgical History:  Procedure Laterality Date   HERNIA REPAIR     Allergies  Allergen Reactions   Amoxicillin Other (See Comments)    hematochezia   Testosterone Other (See Comments)   Prior to Admission medications   Medication Sig Start Date End Date Taking? Authorizing Provider  cetirizine (ZYRTEC) 10 MG tablet Take 1 tablet (10 mg total) by mouth daily. 12/17/18  Yes SanJacelyn Pirma M, MD  fluticasone ([facility] MCG/ACT nasal spray Place 1 spray into both nostrils 2 (two) times daily. 12/17/18  Yes SanJacelyn Pirma M, MD  PAZEO 0.7 % SOLN Place 1 drop into both eyes daily as needed. 07/03/16  Yes StaForrest MoronD  testosterone cypionate (DEPOTESTOSTERONE CYPIONATE) 200 MG/ML injection Inject 1 mL (200 mg total) into the muscle every 14 (fourteen) days. 10/29/21  Yes GreWendie AgresteD  celecoxib (CELEBREX) 200 MG capsule Take 1 capsule (200 mg total) by mouth daily. Patient not taking: Reported on 06/07/2021 12/19/20   GreWendie AgresteD  Cetirizine HCl 10 MG CAPS Cetirizine HCl    [provider]  HUMIRA PEN-CD/UC/HS STARTER 80 MG/0.8ML PNKT Inject into the skin. Patient not taking: Reported on 12/10/2021 07/29/19   [provider]  Olopatadine HCl (PAZEO) 0.7 % SOLN Pazeo    [provider]  Testosterone 1.62 % GEL testosterone    [provider]   Social History   Socioeconomic History   Marital status: Unknown    Spouse name: Not on file   Number of children: Not on file   Years of education: Not on file    Highest education level: Not on file  Occupational History   Not on file  Tobacco Use   Smoking status: Former    Types: Cigarettes    Quit date: 06/01/2003    Years since quitting: 18.5   Smokeless tobacco: Never  Vaping Use   Vaping Use: Never used  Substance and Sexual Activity   Alcohol use: Yes    Comment: rare occasion   Drug use: No  Sexual activity: Yes  Other Topics Concern   Not on file  Social History Narrative   Not on file   Social Determinants of Health   Financial Resource Strain: Not on file  Food Insecurity: Not on file  Transportation Needs: Not on file  Physical Activity: Not on file  Stress: Not on file  Social Connections: Not on file  Intimate Partner Violence: Not on file    Review of Systems 13 point review of systems per patient health survey noted.  Negative other than as indicated above or in HPI.    Objective:   Vitals:   12/10/21 1308  BP: 122/82  Pulse: 97  Temp: (!) 97.1 F (36.2 C)  SpO2: 99%  Weight: 165 lb 3.2 oz (74.9 kg)  Height: 5' 9"  (1.753 m)   {Vitals History (Optional):23777}  Physical Exam Vitals reviewed.  Constitutional:      Appearance: He is well-developed.  HENT:     Head: Normocephalic and atraumatic.     Right Ear: External ear normal.     Left Ear: External ear normal.  Eyes:     Conjunctiva/sclera: Conjunctivae normal.     Pupils: Pupils are equal, round, and reactive to light.  Neck:     Thyroid: No thyromegaly.  Cardiovascular:     Rate and Rhythm: Normal rate and regular rhythm.     Heart sounds: Normal heart sounds.  Pulmonary:     Effort: Pulmonary effort is normal. No respiratory distress.     Breath sounds: Normal breath sounds. No wheezing.  Abdominal:     General: There is no distension.     Palpations: Abdomen is soft.     Tenderness: There is no abdominal tenderness.  Musculoskeletal:        General: No tenderness. Normal range of motion.     Cervical back: Normal range of motion  and neck supple.  Lymphadenopathy:     Cervical: No cervical adenopathy.  Skin:    General: Skin is warm and dry.  Neurological:     Mental Status: He is alert and oriented to person, place, and time.     Deep Tendon Reflexes: Reflexes are normal and symmetric.  Psychiatric:        Behavior: Behavior normal.        Assessment & Plan:  Marcus Gutierrez is a 61 y.o. male . No diagnosis found.   No orders of the defined types were placed in this encounter.  There are no Patient Instructions on file for this visit.    Signed,   Merri Ray, MD [place] 12/10/21 1:18 PM

## 2021-12-10 NOTE — Patient Instructions (Addendum)
If you are interested in RSV vaccine, we have that here. We can schedule nurse visit if needed.  [email or web address]  Keep up the good work with watching diet.   Take care!    Preventive Care 64-60 Years Old, Male Preventive care refers to lifestyle choices and visits with your health care provider that can promote health and wellness. Preventive care visits are also called wellness exams. What can I expect for my preventive care visit? Counseling During your preventive care visit, your health care provider may ask about your: Medical history, including: Past medical problems. Family medical history. Current health, including: Emotional well-being. Home life and relationship well-being. Sexual activity. Lifestyle, including: Alcohol, nicotine or tobacco, and drug use. Access to firearms. Diet, exercise, and sleep habits. Safety issues such as seatbelt and bike helmet use. Sunscreen use. Work and work Statistician. Physical exam Your health care provider will check your: Height and weight. These may be used to calculate your BMI (body mass index). BMI is a measurement that tells if you are at a healthy weight. Waist circumference. This measures the distance around your waistline. This measurement also tells if you are at a healthy weight and may help predict your risk of certain diseases, such as type 2 diabetes and high blood pressure. Heart rate and blood pressure. Body temperature. Skin for abnormal spots. What immunizations do I need?  Vaccines are usually given at various ages, according to a schedule. Your health care provider will recommend vaccines for you based on your age, medical history, and lifestyle or other factors, such as travel or where you work. What tests do I need? Screening Your health care provider may recommend screening tests for certain conditions. This may include: Lipid and cholesterol levels. Diabetes  screening. This is done by checking your blood sugar (glucose) after you have not eaten for a while (fasting). Hepatitis B test. Hepatitis C test. HIV (human immunodeficiency virus) test. STI (sexually transmitted infection) testing, if you are at risk. Lung cancer screening. Prostate cancer screening. Colorectal cancer screening. Talk with your health care provider about your test results, treatment options, and if necessary, the need for more tests. Follow these instructions at home: Eating and drinking  Eat a diet that includes fresh fruits and vegetables, whole grains, lean protein, and low-fat dairy products. Take vitamin and mineral supplements as recommended by your health care provider. Do not drink alcohol if your health care provider tells you not to drink. If you drink alcohol: Limit how much you have to 0-2 drinks a day. Know how much alcohol is in your drink. In the [place] one drink equals one 12 oz bottle of beer (355 mL), one 5 oz glass of wine (148 mL), or one 1 oz glass of hard liquor (44 mL). Lifestyle Brush your teeth every morning and night with fluoride toothpaste. Floss one time each day. Exercise for at least 30 minutes 5 or more days each week. Do not use any products that contain nicotine or tobacco. These products include cigarettes, chewing tobacco, and vaping devices, such as e-cigarettes. If you need help quitting, ask your health care provider. Do not use drugs. If you are sexually active, practice safe sex. Use a condom or other form of protection to prevent STIs. Take aspirin only as told by your health care provider. Make sure that you understand how much to take and what form to take. Work with your health care provider to find out whether it is safe and beneficial for you to  take aspirin daily. Find healthy ways to manage stress, such as: Meditation, yoga, or listening to music. Journaling. Talking to a trusted person. Spending time with friends and  family. Minimize exposure to UV radiation to reduce your risk of skin cancer. Safety Always wear your seat belt while driving or riding in a vehicle. Do not drive: If you have been drinking alcohol. Do not ride with someone who has been drinking. When you are tired or distracted. While texting. If you have been using any mind-altering substances or drugs. Wear a helmet and other protective equipment during sports activities. If you have firearms in your house, make sure you follow all gun safety procedures. What's next? Go to your health care provider once a year for an annual wellness visit. Ask your health care provider how often you should have your eyes and teeth checked. Stay up to date on all vaccines. This information is not intended to replace advice given to you by your health care provider. Make sure you discuss any questions you have with your health care provider. Document Revised: 07/26/2020 Document Reviewed: 07/26/2020 Elsevier Patient Education  [employer].

## 2021-12-11 ENCOUNTER — Ambulatory Visit: Payer: BC Managed Care – PPO

## 2021-12-24 ENCOUNTER — Ambulatory Visit (INDEPENDENT_AMBULATORY_CARE_PROVIDER_SITE_OTHER): Payer: BC Managed Care – PPO | Admitting: Family Medicine

## 2021-12-24 DIAGNOSIS — E291 Testicular hypofunction: Secondary | ICD-10-CM

## 2021-12-24 MED ORDER — TESTOSTERONE CYPIONATE 200 MG/ML IM SOLN
200.0000 mg | INTRAMUSCULAR | Status: DC
  Administered 2021-12-24: 200 mg via INTRAMUSCULAR

## 2021-12-24 NOTE — Progress Notes (Signed)
Gave Testosterone injection in right upper outer quadrant . Pt tolerated injection well

## 2022-01-07 ENCOUNTER — Ambulatory Visit (INDEPENDENT_AMBULATORY_CARE_PROVIDER_SITE_OTHER): Payer: BC Managed Care – PPO | Admitting: Family Medicine

## 2022-01-07 DIAGNOSIS — E291 Testicular hypofunction: Secondary | ICD-10-CM | POA: Diagnosis not present

## 2022-01-07 MED ORDER — TESTOSTERONE CYPIONATE 200 MG/ML IM SOLN
200.0000 mg | INTRAMUSCULAR | Status: DC
  Administered 2022-01-07 – 2022-01-25 (×3): 200 mg via INTRAMUSCULAR

## 2022-01-07 NOTE — Progress Notes (Signed)
Pt came in for his Testosterone injection , Gave in right upper outer quadrant . Pt tolerated injection well

## 2022-01-21 ENCOUNTER — Ambulatory Visit: Payer: BC Managed Care – PPO

## 2022-01-22 ENCOUNTER — Ambulatory Visit (INDEPENDENT_AMBULATORY_CARE_PROVIDER_SITE_OTHER): Payer: BC Managed Care – PPO | Admitting: Family

## 2022-01-22 ENCOUNTER — Other Ambulatory Visit: Payer: Self-pay | Admitting: Family Medicine

## 2022-01-22 ENCOUNTER — Encounter: Payer: Self-pay | Admitting: Family

## 2022-01-22 ENCOUNTER — Ambulatory Visit: Payer: BC Managed Care – PPO

## 2022-01-22 VITALS — BP 132/76 | HR 85 | Temp 98.7°F | Ht 69.0 in | Wt 164.4 lb

## 2022-01-22 DIAGNOSIS — R0989 Other specified symptoms and signs involving the circulatory and respiratory systems: Secondary | ICD-10-CM

## 2022-01-22 DIAGNOSIS — R0981 Nasal congestion: Secondary | ICD-10-CM | POA: Diagnosis not present

## 2022-01-22 DIAGNOSIS — E291 Testicular hypofunction: Secondary | ICD-10-CM

## 2022-01-22 LAB — POC COVID19 BINAXNOW: SARS Coronavirus 2 Ag: NEGATIVE

## 2022-01-22 LAB — POC INFLUENZA A&B (BINAX/QUICKVUE)
Influenza A, POC: NEGATIVE
Influenza B, POC: NEGATIVE

## 2022-01-22 MED ORDER — METHYLPREDNISOLONE 4 MG PO TBPK: ORAL_TABLET | ORAL | 0 refills | Status: DC

## 2022-01-22 NOTE — Progress Notes (Signed)
Acute Office Visit  Subjective:     Patient ID: Marcus Gutierrez, male    DOB: [birth year], 61 y.o.   MRN: [id number]  Chief Complaint  Patient presents with  . Nasal Congestion    Pt states he has had congestion and drainage and has taken OTC meds for it and nothing has helped, pt states it has been going on since Saturday evening, pt has not taken a covid test     HPI Patient is in today with c/o cough, congestion, and sinus drainage that comes and goes. It began 3 days ago. Has been taking an OTC decongestant that has not helped much. No known sick contact. Patient would also like to have his testosterone injection administered today.   Review of Systems  Constitutional: Negative.   HENT:  Positive for congestion.   Respiratory:  Positive for cough and shortness of breath. Negative for wheezing.   Cardiovascular: Negative.   Gastrointestinal: Negative.   Musculoskeletal: Negative.   Neurological: Negative.   Endo/Heme/Allergies: Negative.   Psychiatric/Behavioral: Negative.    All other systems reviewed and are negative.  Past Medical History:  Diagnosis Date  . Allergy   . Family history of ovarian cancer 01/15/2021  . Family history of pancreatic cancer 01/15/2021  . Family history of prostate cancer 01/15/2021  . GERD (gastroesophageal reflux disease)   . Seizures ([place])   . Ulcer   . Ulcerative colitis ([place])     Social History   Socioeconomic History  . Marital status: Unknown    Spouse name: Not on file  . Number of children: Not on file  . Years of education: Not on file  . Highest education level: Not on file  Occupational History  . Not on file  Tobacco Use  . Smoking status: Former    Types: Cigarettes    Quit date: 06/01/2003    Years since quitting: 18.6  . Smokeless tobacco: Never  Vaping Use  . Vaping Use: Never used  Substance and Sexual Activity  . Alcohol use: Yes    Comment: rare occasion  . Drug use: No  . Sexual activity: Yes  Other  Topics Concern  . Not on file  Social History Narrative  . Not on file   Social Determinants of Health   Financial Resource Strain: Not on file  Food Insecurity: Not on file  Transportation Needs: Not on file  Physical Activity: Not on file  Stress: Not on file  Social Connections: Not on file  Intimate Partner Violence: Not on file    Past Surgical History:  Procedure Laterality Date  . HERNIA REPAIR      Family History  Problem Relation Age of Onset  . Pancreatic cancer Mother 11  . Heart attack Mother   . Heart attack Father   . Lupus Sister   . Brain cancer Maternal Uncle 18  . Prostate cancer Paternal Uncle        dx 75s  . Ovarian cancer Maternal Grandmother        dx 61s  . Bladder Cancer Paternal Grandmother 21  . Asthma Son   . Colon cancer Other 75       MGM's sister    Allergies  Allergen Reactions  . Amoxicillin Other (See Comments)    hematochezia  . Testosterone Other (See Comments)    Current Outpatient Medications on File Prior to Visit  Medication Sig Dispense Refill  . cetirizine (ZYRTEC) 10 MG tablet Take 1 tablet (10  mg total) by mouth daily. 30 tablet 11  . fluticasone (FLONASE) 50 MCG/ACT nasal spray Place 1 spray into both nostrils 2 (two) times daily. 16 g 6  . Cetirizine HCl 10 MG CAPS Cetirizine HCl (Patient not taking: Reported on 01/22/2022)    . HUMIRA PEN-CD/UC/HS STARTER 80 MG/0.8ML PNKT Inject into the skin. (Patient not taking: Reported on 12/10/2021)    . Olopatadine HCl (PAZEO) 0.7 % SOLN Pazeo (Patient not taking: Reported on 01/22/2022)    . PAZEO 0.7 % SOLN Place 1 drop into both eyes daily as needed. (Patient not taking: Reported on 01/22/2022) 2.5 mL 0   Current Facility-Administered Medications on File Prior to Visit  Medication Dose Route Frequency Provider Last Rate Last Admin  . testosterone cypionate (DEPOTESTOSTERONE CYPIONATE) injection 200 mg  200 mg Intramuscular Q14 Days Wendie Agreste, MD   200 mg at  12/24/21 1313  . testosterone cypionate (DEPOTESTOSTERONE CYPIONATE) injection 200 mg  200 mg Intramuscular Q14 Days Wendie Agreste, MD   200 mg at 01/07/22 1313    BP 132/76   Pulse 85   Temp 98.7 F (37.1 C)   Ht 5' 9"  (1.753 m)   Wt 164 lb 6.4 oz (74.6 kg)   SpO2 97%   BMI 24.28 kg/m chart      Objective:    BP 132/76   Pulse 85   Temp 98.7 F (37.1 C)   Ht 5' 9"  (1.753 m)   Wt 164 lb 6.4 oz (74.6 kg)   SpO2 97%   BMI 24.28 kg/m    Physical Exam Vitals and nursing note reviewed.  Constitutional:      Appearance: Normal appearance. He is normal weight.  HENT:     Right Ear: Tympanic membrane, ear canal and external ear normal.     Left Ear: Tympanic membrane, ear canal and external ear normal.  Cardiovascular:     Rate and Rhythm: Normal rate and regular rhythm.  Pulmonary:     Effort: Pulmonary effort is normal.     Breath sounds: Normal breath sounds.  Abdominal:     General: Abdomen is flat.     Palpations: Abdomen is soft.  Musculoskeletal:        General: Normal range of motion.     Cervical back: Normal range of motion and neck supple.  Skin:    General: Skin is warm and dry.  Neurological:     General: No focal deficit present.     Mental Status: He is alert and oriented to person, place, and time.  Psychiatric:        Mood and Affect: Mood normal.        Behavior: Behavior normal.    Results for orders placed or performed in visit on 01/22/22  POC COVID-19  Result Value Ref Range   SARS Coronavirus 2 Ag Negative Negative  POC Influenza A&B (Binax test)  Result Value Ref Range   Influenza A, POC Negative Negative   Influenza B, POC Negative Negative        Assessment & Plan:   Problem List Items Addressed This Visit     Hypogonadism male   Other Visit Diagnoses     Sinus congestion    -  Primary   Relevant Orders   POC COVID-19 (Completed)   POC Influenza A&B (Binax test) (Completed)   Chest congestion       Relevant Orders    POC COVID-19 (Completed)   POC Influenza A&B (Binax  test) (Completed)       Meds ordered this encounter  Medications  . methylPREDNISolone (MEDROL DOSEPAK) 4 MG TBPK tablet    Sig: As directed    Dispense:  21 tablet    Refill:  0   Call the office with any questions or concerns. Recheck in 14 days for injection of testosterone and sooner as needed No follow-ups on file.  Kennyth Arnold, FNP

## 2022-01-22 NOTE — Telephone Encounter (Signed)
Controlled substance database reviewed, last filled in September, office visit discussing medication 12/10/2021 with stable labs, refilled.

## 2022-01-22 NOTE — Telephone Encounter (Signed)
Testosterone injection 200 ml LOV: 01/22/22 Last Refill:12/24/21 Upcoming appt: 06/20/22

## 2022-01-25 DIAGNOSIS — E291 Testicular hypofunction: Secondary | ICD-10-CM | POA: Diagnosis not present

## 2022-02-05 ENCOUNTER — Ambulatory Visit: Payer: BC Managed Care – PPO

## 2022-02-06 ENCOUNTER — Ambulatory Visit: Payer: BC Managed Care – PPO

## 2022-02-18 ENCOUNTER — Ambulatory Visit (INDEPENDENT_AMBULATORY_CARE_PROVIDER_SITE_OTHER): Payer: BC Managed Care – PPO

## 2022-02-18 DIAGNOSIS — E291 Testicular hypofunction: Secondary | ICD-10-CM

## 2022-02-18 MED ORDER — TESTOSTERONE CYPIONATE 200 MG/ML IM SOLN
200.0000 mg | INTRAMUSCULAR | Status: DC
  Administered 2022-02-18 – 2022-03-18 (×3): 200 mg via INTRAMUSCULAR

## 2022-02-18 NOTE — Progress Notes (Signed)
Pt came in for his testosterone injection . Gave injection in left upper outer quadrant and he tolerated well/. Advised to return in 14 days for another injection

## 2022-03-04 ENCOUNTER — Ambulatory Visit (INDEPENDENT_AMBULATORY_CARE_PROVIDER_SITE_OTHER): Payer: BC Managed Care – PPO

## 2022-03-04 DIAGNOSIS — E291 Testicular hypofunction: Secondary | ICD-10-CM

## 2022-03-04 NOTE — Progress Notes (Signed)
Pt presented today for his bi weekly injection of testosterone, no concerns

## 2022-03-18 ENCOUNTER — Ambulatory Visit (INDEPENDENT_AMBULATORY_CARE_PROVIDER_SITE_OTHER): Payer: BC Managed Care – PPO

## 2022-03-18 DIAGNOSIS — E291 Testicular hypofunction: Secondary | ICD-10-CM | POA: Diagnosis not present

## 2022-03-18 MED ORDER — TESTOSTERONE CYPIONATE 200 MG/ML IM SOLN: 200.0000 mg | INTRAMUSCULAR | Status: DC

## 2022-03-18 NOTE — Progress Notes (Signed)
Pt received his testosterone injection today . Tolerated well . Advised to return to clinic in 14 days for next injection . Gave 1 mL in right outer upper quadrant /

## 2022-04-01 ENCOUNTER — Ambulatory Visit (INDEPENDENT_AMBULATORY_CARE_PROVIDER_SITE_OTHER): Payer: BC Managed Care – PPO

## 2022-04-01 DIAGNOSIS — E291 Testicular hypofunction: Secondary | ICD-10-CM | POA: Diagnosis not present

## 2022-04-01 MED ORDER — TESTOSTERONE CYPIONATE 200 MG/ML IM SOLN
200.0000 mg | INTRAMUSCULAR | Status: DC
  Administered 2022-04-01: 200 mg via INTRAMUSCULAR

## 2022-04-01 NOTE — Progress Notes (Signed)
Pt recived his Testosterone injection in right upper outer quadrat . Tolerated well and was advised to return in 14 days for next injection

## 2022-04-15 ENCOUNTER — Ambulatory Visit: Payer: BC Managed Care – PPO

## 2022-04-22 ENCOUNTER — Other Ambulatory Visit: Payer: Self-pay | Admitting: Family Medicine

## 2022-04-22 DIAGNOSIS — E291 Testicular hypofunction: Secondary | ICD-10-CM

## 2022-04-22 NOTE — Telephone Encounter (Signed)
Testocerone Last seen 01/2022  I do not see this medication listed in his chart  as to when last filled

## 2022-04-23 NOTE — Telephone Encounter (Signed)
Hypogonadism discussed at his physical 12/10/2021.  Testosterone level normal at that time.  Controlled substance database reviewed.  Last prescription on 01/22/2022 for 6 mL, 200 mg/mL.  Refill ordered.

## 2022-04-29 ENCOUNTER — Ambulatory Visit (INDEPENDENT_AMBULATORY_CARE_PROVIDER_SITE_OTHER): Payer: BC Managed Care – PPO

## 2022-04-29 DIAGNOSIS — E291 Testicular hypofunction: Secondary | ICD-10-CM | POA: Diagnosis not present

## 2022-04-29 MED ORDER — TESTOSTERONE CYPIONATE 200 MG/ML IM SOLN
200.0000 mg | INTRAMUSCULAR | Status: DC
  Administered 2022-04-29: 200 mg via INTRAMUSCULAR

## 2022-04-29 MED ORDER — TESTOSTERONE CYPIONATE 200 MG/ML IM SOLN: 200.0000 mg | INTRAMUSCULAR | Status: DC

## 2022-04-29 NOTE — Progress Notes (Signed)
Pt came in for his testosterone injection pt tolerated well . Advised to return in 14 days for his next injection .

## 2022-05-13 ENCOUNTER — Ambulatory Visit (INDEPENDENT_AMBULATORY_CARE_PROVIDER_SITE_OTHER): Payer: BC Managed Care – PPO | Admitting: Family Medicine

## 2022-05-13 DIAGNOSIS — E291 Testicular hypofunction: Secondary | ICD-10-CM | POA: Diagnosis not present

## 2022-05-13 MED ORDER — TESTOSTERONE CYPIONATE 200 MG/ML IM SOLN
200.0000 mg | INTRAMUSCULAR | Status: DC
  Administered 2022-05-13: 200 mg via INTRAMUSCULAR

## 2022-05-13 NOTE — Progress Notes (Unsigned)
Pt came in today for Testerone injection. Gave injection in left outer quad. Pt was advised to return in 14 days.

## 2022-05-27 ENCOUNTER — Ambulatory Visit (INDEPENDENT_AMBULATORY_CARE_PROVIDER_SITE_OTHER): Payer: BC Managed Care – PPO

## 2022-05-27 DIAGNOSIS — E291 Testicular hypofunction: Secondary | ICD-10-CM

## 2022-05-27 MED ORDER — TESTOSTERONE CYPIONATE 200 MG/ML IM SOLN
200.0000 mg | INTRAMUSCULAR | Status: DC
  Administered 2022-05-27 – 2022-06-07 (×2): 200 mg via INTRAMUSCULAR

## 2022-05-27 NOTE — Progress Notes (Signed)
Gave 33mL of Testosterone injection in right upper outer quadrant . Pt tolerated injection well and was advised to return in 14 days for another injection .

## 2022-06-07 ENCOUNTER — Ambulatory Visit: Payer: BC Managed Care – PPO

## 2022-06-07 ENCOUNTER — Ambulatory Visit (INDEPENDENT_AMBULATORY_CARE_PROVIDER_SITE_OTHER): Payer: BC Managed Care – PPO

## 2022-06-07 DIAGNOSIS — E291 Testicular hypofunction: Secondary | ICD-10-CM

## 2022-06-11 NOTE — Progress Notes (Signed)
Pt presented to the office for a Testosterone injection

## 2022-06-20 ENCOUNTER — Ambulatory Visit: Payer: BC Managed Care – PPO | Admitting: Family Medicine

## 2022-06-24 ENCOUNTER — Ambulatory Visit: Payer: BC Managed Care – PPO | Admitting: Family Medicine

## 2022-06-24 VITALS — BP 126/80 | HR 98 | Temp 98.0°F | Resp 16 | Ht 69.0 in | Wt 180.0 lb

## 2022-06-24 DIAGNOSIS — J309 Allergic rhinitis, unspecified: Secondary | ICD-10-CM

## 2022-06-24 DIAGNOSIS — E291 Testicular hypofunction: Secondary | ICD-10-CM | POA: Diagnosis not present

## 2022-06-24 DIAGNOSIS — Z5181 Encounter for therapeutic drug level monitoring: Secondary | ICD-10-CM | POA: Diagnosis not present

## 2022-06-24 DIAGNOSIS — R7303 Prediabetes: Secondary | ICD-10-CM

## 2022-06-24 MED ORDER — FLUTICASONE PROPIONATE 50 MCG/ACT NA SUSP: 1.0000 | Freq: Two times a day (BID) | NASAL | 6 refills | Status: DC

## 2022-06-24 MED ORDER — LEVOCETIRIZINE DIHYDROCHLORIDE 5 MG PO TABS: 5.0000 mg | ORAL_TABLET | Freq: Every evening | ORAL | 3 refills | Status: DC

## 2022-06-24 NOTE — Patient Instructions (Signed)
No med changes at this time.  If testosterone level is low we can recheck it 1 week after injection for more accurate level.  I will send in Xyzal to your pharmacy but if that is not covered would recommend checking out-of-pocket cost for cetirizine, and can send in another refill if needed.  Let me know.  Recheck in 6 months for physical.  Take care.

## 2022-06-24 NOTE — Progress Notes (Unsigned)
Subjective:  Patient ID: Marcus Gutierrez, male    DOB: [birth year]  Age: 62 y.o. MRN: [id number]  CC:  Chief Complaint  Patient presents with   Follow-up    Follow up    HPI Marcus Gutierrez presents for  Follow up. Busy 2 weeks, dtr graduated and then cruise to [place]. Retirement and birthday present. Retired since jan 1st - considering part time work.   Prediabetes: Had been improving in October, weight improved at that time with less pastries, eating better.  Weight has increased somewhat since that time. Can make some diet/exercise changes if needed. No blurry vision. Incr thirst or urinary frequency.  Lab Results  Component Value Date   HGBA1C 6.0 12/10/2021   Wt Readings from Last 3 Encounters:  06/24/22 180 lb (81.6 kg)  01/22/22 164 lb 6.4 oz (74.6 kg)  12/10/21 165 lb 3.2 oz (74.9 kg)   Crohn's Followed by gastroenterology for Crohn's disease, treated with Stelara. Working well.   Allergic rhinitis Treated with Zyrtec, Flonase. Working well. Cetirizine not covered by insurance, would like to try xyzal.    Hypogonadism Treated with testosterone injection 200 mg every 14 days, last dose 4/26. Due  today.  Hx of elevated PSA - Dr. Alvester Morin.  Lab Results  Component Value Date   WBC 7.9 12/10/2021   HGB 16.3 12/10/2021   HCT 47.9 12/10/2021   MCV 90.5 12/10/2021   PLT 354.0 12/10/2021   Lab Results  Component Value Date   PSA1 0.7 03/13/2020   PSA1 1.2 04/14/2018   PSA1 2.4 07/03/2016   PSA 5.16 (H) 12/07/2020   PSA 1.58 07/15/2015   PSA 1.67 02/13/2015    Lab Results  Component Value Date   CHOL 153 12/10/2021   HDL 66.50 12/10/2021   LDLCALC 76 12/10/2021   TRIG 55.0 12/10/2021   CHOLHDL 2 12/10/2021   Lab Results  Component Value Date   TESTOSTERONE 371.06 12/10/2021      History Patient Active Problem List   Diagnosis Date Noted   Genetic testing 02/13/2021   Family history of pancreatic cancer 01/15/2021   Family history of ovarian  cancer 01/15/2021   Family history of brain cancer 01/15/2021   Family history of prostate cancer 01/15/2021   Acne rosacea, papular type 10/26/2020   Depression 03/05/2015   Rising PSA level 02/13/2015   Colitis 12/11/2011   Hypogonadism male 11/29/2011   Past Medical History:  Diagnosis Date   Allergy    Family history of ovarian cancer 01/15/2021   Family history of pancreatic cancer 01/15/2021   Family history of prostate cancer 01/15/2021   GERD (gastroesophageal reflux disease)    Seizures (HCC)    Ulcer    Ulcerative colitis (HCC)    Past Surgical History:  Procedure Laterality Date   HERNIA REPAIR     Allergies  Allergen Reactions   Amoxicillin Other (See Comments)    hematochezia   Testosterone Other (See Comments)   Prior to Admission medications   Medication Sig Start Date End Date Taking? Authorizing Provider  cetirizine (ZYRTEC) 10 MG tablet Take 1 tablet (10 mg total) by mouth daily. 12/17/18  Yes Lezlie Lye, Meda Coffee, MD  Cetirizine HCl 10 MG CAPS    Yes [provider]  fluticasone (FLONASE) 50 MCG/ACT nasal spray Place 1 spray into both nostrils 2 (two) times daily. 12/17/18  Yes Lezlie Lye, Meda Coffee, MD  HUMIRA PEN-CD/UC/HS STARTER 80 MG/0.8ML PNKT Inject into the skin. 07/29/19  Yes  [provider]  methylPREDNISolone (MEDROL DOSEPAK) 4 MG TBPK tablet As directed 01/22/22  Yes Worthy Rancher B, FNP  Olopatadine HCl (PAZEO) 0.7 % SOLN    Yes [provider]  PAZEO 0.7 % SOLN Place 1 drop into both eyes daily as needed. 07/03/16  Yes Doristine Bosworth, MD   Social History   Socioeconomic History   Marital status: Unknown    Spouse name: Not on file   Number of children: Not on file   Years of education: Not on file   Highest education level: Not on file  Occupational History   Not on file  Tobacco Use   Smoking status: Former    Types: Cigarettes    Quit date: 06/01/2003    Years since quitting: 19.0   Smokeless tobacco: Never   Vaping Use   Vaping Use: Never used  Substance and Sexual Activity   Alcohol use: Yes    Comment: rare occasion   Drug use: No   Sexual activity: Yes  Other Topics Concern   Not on file  Social History Narrative   Not on file   Social Determinants of Health   Financial Resource Strain: Not on file  Food Insecurity: Not on file  Transportation Needs: Not on file  Physical Activity: Not on file  Stress: Not on file  Social Connections: Not on file  Intimate Partner Violence: Not on file   Review of Systems Per HPI.   Objective:   Vitals:   06/24/22 1404  BP: 126/80  Pulse: 98  Resp: 16  Temp: 98 F (36.7 C)  TempSrc: Oral  SpO2: 98%  Weight: 180 lb (81.6 kg)  Height: 5\' 9"  (1.753 m)     Physical Exam Vitals reviewed.  Constitutional:      Appearance: He is well-developed.  HENT:     Head: Normocephalic and atraumatic.  Neck:     Vascular: No carotid bruit or JVD.  Cardiovascular:     Rate and Rhythm: Normal rate and regular rhythm.     Heart sounds: Normal heart sounds. No murmur heard. Pulmonary:     Effort: Pulmonary effort is normal.     Breath sounds: Normal breath sounds. No rales.  Musculoskeletal:     Right lower leg: No edema.     Left lower leg: No edema.  Skin:    General: Skin is warm and dry.  Neurological:     Mental Status: He is alert and oriented to person, place, and time.  Psychiatric:        Mood and Affect: Mood normal.        Assessment & Plan:  Marcus Gutierrez is a 62 y.o. male . No diagnosis found.   No orders of the defined types were placed in this encounter.  There are no Patient Instructions on file for this visit.    Signed,   Meredith Staggers, MD [facility], [facility] 06/24/22 2:45 PM

## 2022-06-25 ENCOUNTER — Ambulatory Visit (INDEPENDENT_AMBULATORY_CARE_PROVIDER_SITE_OTHER): Payer: BC Managed Care – PPO

## 2022-06-25 ENCOUNTER — Encounter: Payer: Self-pay | Admitting: Family Medicine

## 2022-06-25 DIAGNOSIS — E291 Testicular hypofunction: Secondary | ICD-10-CM | POA: Diagnosis not present

## 2022-06-25 LAB — CBC
HCT: 47.1 % (ref 39.0–52.0)
Hemoglobin: 15.6 g/dL (ref 13.0–17.0)
MCHC: 33 g/dL (ref 30.0–36.0)
MCV: 91.6 fl (ref 78.0–100.0)
Platelets: 333 10*3/uL (ref 150.0–400.0)
RBC: 5.14 Mil/uL (ref 4.22–5.81)
RDW: 14.3 % (ref 11.5–15.5)
WBC: 8.2 10*3/uL (ref 4.0–10.5)

## 2022-06-25 LAB — LIPID PANEL
Cholesterol: 152 mg/dL (ref 0–200)
HDL: 52.3 mg/dL (ref >39.00)
LDL Cholesterol: 85 mg/dL (ref 0–99)
NonHDL: 99.98
Total CHOL/HDL Ratio: 3
Triglycerides: 73 mg/dL (ref 0.0–149.0)
VLDL: 14.6 mg/dL (ref 0.0–40.0)

## 2022-06-25 LAB — TESTOSTERONE: Testosterone: 266.55 ng/dL — ABNORMAL LOW (ref 300.00–890.00)

## 2022-06-25 LAB — CBC WITH DIFFERENTIAL/PLATELET
Basophils Absolute: 0.1 10*3/uL (ref 0.0–0.1)
Basophils Relative: 1.1 % (ref 0.0–3.0)
Eosinophils Absolute: 0.2 10*3/uL (ref 0.0–0.7)
Eosinophils Relative: 1.9 % (ref 0.0–5.0)
HCT: 47.1 % (ref 39.0–52.0)
Hemoglobin: 15.6 g/dL (ref 13.0–17.0)
Lymphocytes Relative: 38.9 % (ref 12.0–46.0)
Lymphs Abs: 3.2 10*3/uL (ref 0.7–4.0)
MCHC: 33 g/dL (ref 30.0–36.0)
MCV: 91.6 fl (ref 78.0–100.0)
Monocytes Absolute: 1.2 10*3/uL — ABNORMAL HIGH (ref 0.1–1.0)
Monocytes Relative: 14.8 % — ABNORMAL HIGH (ref 3.0–12.0)
Neutro Abs: 3.6 10*3/uL (ref 1.4–7.7)
Neutrophils Relative %: 43.3 % (ref 43.0–77.0)
Platelets: 333 10*3/uL (ref 150.0–400.0)
RBC: 5.14 Mil/uL (ref 4.22–5.81)
RDW: 14.3 % (ref 11.5–15.5)
WBC: 8.2 10*3/uL (ref 4.0–10.5)

## 2022-06-25 LAB — HEMOGLOBIN A1C: Hgb A1c MFr Bld: 6 % (ref 4.6–6.5)

## 2022-06-25 LAB — COMPREHENSIVE METABOLIC PANEL
ALT: 30 U/L (ref 0–53)
AST: 23 U/L (ref 0–37)
Albumin: 4 g/dL (ref 3.5–5.2)
Alkaline Phosphatase: 84 U/L (ref 39–117)
BUN: 13 mg/dL (ref 6–23)
CO2: 28 mEq/L (ref 19–32)
Calcium: 9.2 mg/dL (ref 8.4–10.5)
Chloride: 101 mEq/L (ref 96–112)
Creatinine, Ser: 1.02 mg/dL (ref 0.40–1.50)
GFR: 79.6 mL/min (ref >60.00)
Glucose, Bld: 85 mg/dL (ref 70–99)
Potassium: 4.4 mEq/L (ref 3.5–5.1)
Sodium: 138 mEq/L (ref 135–145)
Total Bilirubin: 0.6 mg/dL (ref 0.2–1.2)
Total Protein: 7.4 g/dL (ref 6.0–8.3)

## 2022-06-25 LAB — PSA: PSA: 1.39 ng/mL (ref 0.10–4.00)

## 2022-06-25 MED ORDER — TESTOSTERONE CYPIONATE 200 MG/ML IM SOLN
200.0000 mg | INTRAMUSCULAR | Status: DC
Start: 2022-06-25 — End: 2022-08-06
  Administered 2022-06-25: 200 mg via INTRAMUSCULAR

## 2022-06-25 NOTE — Progress Notes (Signed)
Pt received 200 mg of testocerone injection today pt supplied his medication . Gave in right Vasti [place] lateralis and pt states he did not like the way we are giving injections now due to the pain . He did not have any reactions to the medication . Advised to return in 14 days for another injection

## 2022-07-02 ENCOUNTER — Other Ambulatory Visit: Payer: Self-pay

## 2022-07-02 ENCOUNTER — Encounter: Payer: Self-pay | Admitting: Family Medicine

## 2022-07-02 DIAGNOSIS — E291 Testicular hypofunction: Secondary | ICD-10-CM

## 2022-07-05 NOTE — Telephone Encounter (Signed)
Pt is asking if you had considered this and wants to know your thoughts

## 2022-07-09 ENCOUNTER — Ambulatory Visit (INDEPENDENT_AMBULATORY_CARE_PROVIDER_SITE_OTHER): Payer: BC Managed Care – PPO

## 2022-07-09 DIAGNOSIS — E291 Testicular hypofunction: Secondary | ICD-10-CM | POA: Diagnosis not present

## 2022-07-09 MED ORDER — TESTOSTERONE CYPIONATE 200 MG/ML IM SOLN
200.0000 mg | INTRAMUSCULAR | Status: DC
Start: 2022-07-09 — End: 2022-08-06
  Administered 2022-07-09: 200 mg via INTRAMUSCULAR

## 2022-07-09 NOTE — Progress Notes (Signed)
Pt came in for his Testosterone injection . Administered 1 mL (200 mg) in left outer upper quadrant per pt request . Tolerated well . Advised to return to clinic in 14 days for his next injection .  Pt states the last injection of Testosterone that was given in his Vastus Lateralis he hurt for days so he does not prefer that injection site any longer .

## 2022-07-16 ENCOUNTER — Other Ambulatory Visit (INDEPENDENT_AMBULATORY_CARE_PROVIDER_SITE_OTHER): Payer: BC Managed Care – PPO

## 2022-07-16 DIAGNOSIS — E291 Testicular hypofunction: Secondary | ICD-10-CM

## 2022-07-16 LAB — TESTOSTERONE: Testosterone: 578.32 ng/dL (ref 300.00–890.00)

## 2022-07-22 ENCOUNTER — Ambulatory Visit (INDEPENDENT_AMBULATORY_CARE_PROVIDER_SITE_OTHER): Payer: BC Managed Care – PPO

## 2022-07-22 DIAGNOSIS — E291 Testicular hypofunction: Secondary | ICD-10-CM

## 2022-07-22 MED ORDER — TESTOSTERONE CYPIONATE 200 MG/ML IM SOLN
200.0000 mg | INTRAMUSCULAR | Status: DC
Start: 2022-07-22 — End: 2022-08-06
  Administered 2022-07-22: 200 mg via INTRAMUSCULAR

## 2022-07-22 NOTE — Progress Notes (Signed)
Pt came in for his testosterone injection . Tolerated well . Gave in left upper outer quadrant .

## 2022-08-05 ENCOUNTER — Ambulatory Visit: Payer: BC Managed Care – PPO

## 2022-08-06 ENCOUNTER — Ambulatory Visit (INDEPENDENT_AMBULATORY_CARE_PROVIDER_SITE_OTHER): Payer: BC Managed Care – PPO

## 2022-08-06 DIAGNOSIS — E291 Testicular hypofunction: Secondary | ICD-10-CM | POA: Diagnosis not present

## 2022-08-06 MED ORDER — TESTOSTERONE CYPIONATE 200 MG/ML IM SOLN
200.0000 mg | INTRAMUSCULAR | Status: DC
Start: 2022-08-06 — End: 2022-09-30
  Administered 2022-08-06: 200 mg via INTRAMUSCULAR

## 2022-08-06 NOTE — Progress Notes (Signed)
Pt came in for his testosterone injection  Gave 1 mL of Testerone to pt in right outer upper quadrant . He tolerated injection well and will make another apt for 2 weeks for his next injection

## 2022-08-07 ENCOUNTER — Other Ambulatory Visit: Payer: Self-pay | Admitting: Family Medicine

## 2022-08-07 DIAGNOSIS — E291 Testicular hypofunction: Secondary | ICD-10-CM

## 2022-08-07 NOTE — Telephone Encounter (Signed)
Testosterone 200 mg  LOV: 06/24/22 Last Refill: Upcoming appt: none

## 2022-08-07 NOTE — Telephone Encounter (Signed)
Office visit with medication discussion and monitoring labs on 06/24/2022.  Controlled substance database reviewed.  Testosterone last filled 04/23/2022, previously 01/22/2022.  Refill ordered.

## 2022-08-15 ENCOUNTER — Telehealth: Payer: Self-pay

## 2022-08-15 ENCOUNTER — Other Ambulatory Visit (HOSPITAL_COMMUNITY): Payer: Self-pay

## 2022-08-15 NOTE — Telephone Encounter (Signed)
Patient Advocate Encounter   Received notification from [employer] that prior authorization is required for Testosterone Cypionate 200MG /ML intramuscular solution   Submitted: 08-15-2022 Key [id number]  Status is pending

## 2022-08-19 ENCOUNTER — Ambulatory Visit: Payer: BC Managed Care – PPO

## 2022-08-20 ENCOUNTER — Ambulatory Visit: Payer: BC Managed Care – PPO

## 2022-08-20 ENCOUNTER — Ambulatory Visit: Payer: BC Managed Care – PPO | Admitting: Family Medicine

## 2022-08-20 ENCOUNTER — Encounter: Payer: Self-pay | Admitting: Family Medicine

## 2022-08-20 VITALS — BP 120/70 | HR 97 | Temp 98.2°F | Ht 69.0 in | Wt 175.0 lb

## 2022-08-20 DIAGNOSIS — M25571 Pain in right ankle and joints of right foot: Secondary | ICD-10-CM

## 2022-08-20 MED ORDER — PREDNISONE 10 MG PO TABS
ORAL_TABLET | ORAL | 0 refills | Status: AC
Start: 2022-08-20 — End: 2022-08-25

## 2022-08-20 NOTE — Progress Notes (Signed)
Acute Office Visit   Subjective:  Patient ID: Marcus Gutierrez, male    DOB: [birth year], 62 y.o.   MRN: [id number]  Chief Complaint  Patient presents with   Foot Pain    Rt foot is causing him a lot of pain, notes no injury, woke up to ankle/foot pain, notes difficulty baring weight, range of motion effected     HPI Patient is a 62 year old male that presents with right foot and ankle pain. Pain started yesterday, but was able to do his regular activity. Right foot pain located anterior of foot and lateral and medial ankle. Patient reports he woke up with increased right foot and ankle pain. Patient describes pain as constant sharp, intensity changes. He reports he has noticed mild anterior swelling. He reports it is hard to bare weight and decreased ROM.   Denies any recent injury.   He reports he has been treated for plantar factitias. It does not feel the same.  Denies any history of gout.   He reports he has only been applying icy hot. No Tylenol or Ibuprofen.   ROS See HPI above      Objective:   BP 120/70   Pulse 97   Temp 98.2 F (36.8 C) (Temporal)   Ht 5\' 9"  (1.753 m)   Wt 175 lb (79.4 kg)   SpO2 100%   BMI 25.84 kg/m    Physical Exam Vitals reviewed.  Constitutional:      General: He is not in acute distress.    Appearance: Normal appearance. He is not ill-appearing, toxic-appearing or diaphoretic.  HENT:     Head: Normocephalic and atraumatic.  Eyes:     General:        Right eye: No discharge.        Left eye: No discharge.     Conjunctiva/sclera: Conjunctivae normal.  Cardiovascular:     Rate and Rhythm: Normal rate.  Pulmonary:     Effort: Pulmonary effort is normal. No respiratory distress.  Musculoskeletal:        General: Normal range of motion.     Right ankle: No swelling. Tenderness present.     Right foot: Tenderness present. No swelling. Normal pulse.     Comments: Decrease ROM of right ankle.   Skin:    General: Skin is warm and dry.   Neurological:     General: No focal deficit present.     Mental Status: He is alert and oriented to person, place, and time. Mental status is at baseline.  Psychiatric:        Mood and Affect: Mood normal.        Behavior: Behavior normal.        Thought Content: Thought content normal.        Judgment: Judgment normal.       Assessment & Plan:  Pain in right ankle and joints of right foot -     predniSONE; Take 6 tablets (60 mg total) by mouth daily with breakfast for 1 day, THEN 5 tablets (50 mg total) daily with breakfast for 1 day, THEN 4 tablets (40 mg total) daily with breakfast for 1 day, THEN 3 tablets (30 mg total) daily with breakfast for 1 day, THEN 2 tablets (20 mg total) daily with breakfast for 1 day, THEN 1 tablet (10 mg total) daily with breakfast for 1 day.  Dispense: 21 tablet; Refill: 0  -Prescribed Prednisone 10mg  tablet, 6 taper for pain in foot and  ankle. Advised to not take additional NSAIDS, such as Ibuprofen, Advil, Naproxen, or Aleve while taking Prednisone. Please eat something when taking medication. -He may take Tylenol for pain. -He may use heat or cool compresses, 4-6 times a day, 20 minutes at a time.  -If no improvement, follow up. If symptoms persist, recommend an x-ray of foot and ankleand referral to podiatry.  -Patient expressed understanding of plan of care and treatment.    Zandra Abts, NP

## 2022-08-20 NOTE — Telephone Encounter (Signed)
What is the status of this PA? Patient is due for injection today, hoping not to throw off schedule

## 2022-08-20 NOTE — Patient Instructions (Signed)
-  It was a pleasure to care for you today. -START Prednisone 10mg  tablet, 6 taper for pain in foot and ankle. Do not take additional NSAIDS, such as Ibuprofen, Advil, Naproxen, or Aleve while taking Prednisone. Please eat something when taking medication. -You may take Tylenol for pain. -You may use heat or cool compresses, 4-6 times a day, 20 minutes at a time.  -If no improvement, follow up.

## 2022-08-21 NOTE — Telephone Encounter (Signed)
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

## 2022-08-21 NOTE — Telephone Encounter (Signed)
Can you answer this question about Dx? Hypogonadism primary or hypogonadotropic?

## 2022-08-22 NOTE — Telephone Encounter (Signed)
See Dr Amanda Cockayne message on Dx pt has been on this therapy for years

## 2022-08-22 NOTE — Telephone Encounter (Signed)
Pharmacy Patient Advocate Encounter   Received notification from Pt Calls Messages that prior authorization for Testosterone Cypionate 200MG /ML intramuscular solution  is required/requested.   Insurance verification completed.   The patient is insured through [facility] .    PA submitted to [employer] [facility] via CoverMyMeds Key/confirmation #/EOC  [id number] Status is pending

## 2022-08-23 ENCOUNTER — Other Ambulatory Visit (HOSPITAL_COMMUNITY): Payer: Self-pay

## 2022-08-23 NOTE — Telephone Encounter (Signed)
Pharmacy Patient Advocate Encounter  Received notification from [facility] that Prior Authorization for Testosterone Cypionate 200MG /ML intramuscular solution has been APPROVED from 08/22/22 to 08/21/25.Marland Kitchen  PA #/Case ID/Reference #: [id number]

## 2022-08-23 NOTE — Telephone Encounter (Signed)
Patient has been approved and he will pick this up later today

## 2022-09-03 ENCOUNTER — Ambulatory Visit (INDEPENDENT_AMBULATORY_CARE_PROVIDER_SITE_OTHER): Payer: BC Managed Care – PPO

## 2022-09-03 DIAGNOSIS — E291 Testicular hypofunction: Secondary | ICD-10-CM

## 2022-09-03 MED ORDER — TESTOSTERONE CYPIONATE 200 MG/ML IM SOLN
200.0000 mg | INTRAMUSCULAR | Status: DC
Start: 2022-09-03 — End: 2022-09-30
  Administered 2022-09-03 – 2022-09-30 (×2): 200 mg via INTRAMUSCULAR

## 2022-09-03 NOTE — Progress Notes (Signed)
Pt came in for his Testosterone injection . Gave 1 mL in left upper outer quadrant .  He tolerated injection well and advised to return in 14 days for another injection.

## 2022-09-16 ENCOUNTER — Ambulatory Visit: Payer: BC Managed Care – PPO

## 2022-09-17 ENCOUNTER — Ambulatory Visit: Payer: BC Managed Care – PPO

## 2022-09-30 ENCOUNTER — Ambulatory Visit: Payer: BC Managed Care – PPO

## 2022-09-30 DIAGNOSIS — E291 Testicular hypofunction: Secondary | ICD-10-CM | POA: Diagnosis not present

## 2022-09-30 NOTE — Progress Notes (Signed)
Pt came in for his testosterone injection . Gave injection in left upper outer quadrant . Pt tolerated well

## 2022-10-15 ENCOUNTER — Ambulatory Visit: Payer: BC Managed Care – PPO

## 2022-10-16 ENCOUNTER — Ambulatory Visit (INDEPENDENT_AMBULATORY_CARE_PROVIDER_SITE_OTHER): Payer: BC Managed Care – PPO | Admitting: Family Medicine

## 2022-10-16 DIAGNOSIS — R7989 Other specified abnormal findings of blood chemistry: Secondary | ICD-10-CM | POA: Diagnosis not present

## 2022-10-16 MED ORDER — TESTOSTERONE CYPIONATE 200 MG/ML IM SOLN
200.0000 mg | Freq: Once | INTRAMUSCULAR | Status: AC
Start: 2022-10-16 — End: 2022-10-16
  Administered 2022-10-16: 200 mg via INTRAMUSCULAR

## 2022-10-16 NOTE — Progress Notes (Signed)
Pt was given injection in Right upper outer Pt has no complaints

## 2022-10-28 ENCOUNTER — Ambulatory Visit (INDEPENDENT_AMBULATORY_CARE_PROVIDER_SITE_OTHER): Payer: BC Managed Care – PPO | Admitting: Family Medicine

## 2022-10-28 ENCOUNTER — Ambulatory Visit: Payer: BC Managed Care – PPO

## 2022-10-28 DIAGNOSIS — R7989 Other specified abnormal findings of blood chemistry: Secondary | ICD-10-CM

## 2022-10-28 MED ORDER — TESTOSTERONE CYPIONATE 200 MG/ML IM SOLN
200.0000 mg | INTRAMUSCULAR | Status: DC
Start: 2022-10-28 — End: 2022-11-27
  Administered 2022-10-28 – 2022-11-26 (×3): 200 mg via INTRAMUSCULAR

## 2022-10-28 NOTE — Progress Notes (Signed)
Pt was given Testosterone 200 mg 1 mL Left upper outer  No issues

## 2022-11-11 ENCOUNTER — Ambulatory Visit (INDEPENDENT_AMBULATORY_CARE_PROVIDER_SITE_OTHER): Payer: BC Managed Care – PPO

## 2022-11-11 DIAGNOSIS — E291 Testicular hypofunction: Secondary | ICD-10-CM | POA: Diagnosis not present

## 2022-11-11 NOTE — Progress Notes (Signed)
Marcus Gutierrez is a 62 y.o. male presents to the office today for Testosterone injections, per physician's orders.  Testosterone Cypionate (med), 200mg /4mL (dose),  IM (route) was administered Right upper outter quadrant (location) today. Patient tolerated injection. Patient due for follow up labs/provider appt: Yes. Date due: 11/25/22, appt made Yes Patient next injection due: 11/25/22, appt made Yes  [name]

## 2022-11-15 ENCOUNTER — Ambulatory Visit: Payer: BC Managed Care – PPO | Admitting: Family Medicine

## 2022-11-15 ENCOUNTER — Encounter: Payer: Self-pay | Admitting: Family Medicine

## 2022-11-15 VITALS — BP 122/82 | HR 99 | Temp 98.8°F | Wt 175.8 lb

## 2022-11-15 DIAGNOSIS — M79644 Pain in right finger(s): Secondary | ICD-10-CM

## 2022-11-15 DIAGNOSIS — R0789 Other chest pain: Secondary | ICD-10-CM | POA: Diagnosis not present

## 2022-11-15 DIAGNOSIS — Z23 Encounter for immunization: Secondary | ICD-10-CM | POA: Diagnosis not present

## 2022-11-15 DIAGNOSIS — M25511 Pain in right shoulder: Secondary | ICD-10-CM

## 2022-11-15 MED ORDER — PREDNISONE 10 MG PO TABS: ORAL_TABLET | ORAL | 0 refills | Status: DC

## 2022-11-15 NOTE — Progress Notes (Signed)
Subjective:    Patient ID: Marcus Gutierrez, male    DOB: [birth year], 62 y.o.   MRN: [id number]  HPI Shoulder pain- R sided, started last night.  No hx of shoulder pain, no injury.  Pain is radiating into upper arm.  Pt reports trap is TTP.  No relief w/ heat or tylenol.    R thumb pain- CMC joint.  Painful to hold a pencil.  Has been ongoing x5 weeks.  No swelling, redness, or warmth.    Chest pain- woke up yesterday morning and 'chest just felt sore'.  Both sides of sternum.  Had 'a lot of discomfort' when taking a deep breath.  Today discomfort is much less.  Denies recent reflux sxs.  Does have hx of Crohn's- on Stelara.  Chest was TTP.  Had discomfort w/ swallowing.     Review of Systems For ROS see HPI     Objective:   Physical Exam Vitals reviewed.  Constitutional:      General: He is not in acute distress.    Appearance: Normal appearance. He is well-developed.  HENT:     Head: Normocephalic and atraumatic.  Eyes:     Extraocular Movements: Extraocular movements intact.     Conjunctiva/sclera: Conjunctivae normal.     Pupils: Pupils are equal, round, and reactive to light.  Neck:     Thyroid: No thyromegaly.  Cardiovascular:     Rate and Rhythm: Normal rate and regular rhythm.     Pulses: Normal pulses.     Heart sounds: Normal heart sounds. No murmur heard. Pulmonary:     Effort: Pulmonary effort is normal. No respiratory distress.     Breath sounds: Normal breath sounds.  Chest:     Chest wall: Tenderness (over sternum) present.  Abdominal:     General: Bowel sounds are normal. There is no distension.     Palpations: Abdomen is soft.  Musculoskeletal:        General: Tenderness (TTP over R CMC joint) present.     Cervical back: Normal range of motion and neck supple. Tenderness (TTP over R trap w/ notable spasm) present.     Right lower leg: No edema.     Left lower leg: No edema.  Lymphadenopathy:     Cervical: No cervical adenopathy.  Skin:    General:  Skin is warm and dry.  Neurological:     General: No focal deficit present.     Mental Status: He is alert and oriented to person, place, and time.     Cranial Nerves: No cranial nerve deficit.  Psychiatric:        Mood and Affect: Mood normal.        Behavior: Behavior normal.           Assessment & Plan:  Chest pain- new.  EKG today WNL.  Pain is reproducible on exam making cardiac etiology less likely.  Pt reports pain is much improved compared to yesterday.  Denies reflux sxs.  Suspect musculoskeletal pain that is improving on its own.  Reviewed supportive care and red flags that should prompt return.  Pt expressed understanding and is in agreement w/ plan.   R shoulder pain- new.  Sxs started suddenly last night.  No known injury.  Pt's trap is tight and he reports pain is radiating into R arm.  Suspect his trap spasm is causing inflammation/irritation of the nerve causing radiating shoulder pain.  Start prednisone taper.  Reviewed supportive care and red  flags that should prompt return.  Pt expressed understanding and is in agreement w/ plan.   R thumb pain- new.  At [facility] joint.  Sxs started 5 weeks ago.  No painful to hold a pencil.  Will start Prednisone taper as above and see if pain improves.  Encouraged ice.  If no improvement will likely need referral.  Pt expressed understanding and is in agreement w/ plan.

## 2022-11-15 NOTE — Patient Instructions (Signed)
Follow up as needed or as scheduled START the Prednisone as directed- 3 pills at the same time x3 days, then 2 pills at the same time x3 days, then 1 pill daily.  Take w/ food  USE a heating pad on your shoulder, ice on your thumb You can add Tylenol as needed for breakthrough pain If symptoms aren't better by mid-week, please let us know If your symptoms change or worsen, either contact us or go to the ER Call with any questions or concerns Hang in there!!!

## 2022-11-26 ENCOUNTER — Ambulatory Visit (INDEPENDENT_AMBULATORY_CARE_PROVIDER_SITE_OTHER): Payer: BC Managed Care – PPO

## 2022-11-26 DIAGNOSIS — R7989 Other specified abnormal findings of blood chemistry: Secondary | ICD-10-CM

## 2022-11-26 NOTE — Progress Notes (Signed)
Marcus Gutierrez is a 62 y.o. male presents to the office today for Testosterone:Cyponate 200mg /mL injections, per physician's orders.   Patient next injection due: 12/09/2022, appt made Yes  [name]

## 2022-11-27 ENCOUNTER — Other Ambulatory Visit: Payer: Self-pay | Admitting: Family Medicine

## 2022-11-27 DIAGNOSIS — E291 Testicular hypofunction: Secondary | ICD-10-CM

## 2022-11-27 NOTE — Telephone Encounter (Signed)
Controlled substance database reviewed.  Prescription last filled on 08/24/2022.  Office visit in May with 92-month follow-up planned.  Refill ordered.

## 2022-11-27 NOTE — Telephone Encounter (Signed)
Patient is requesting a refill of the following medications: Requested Prescriptions   Pending Prescriptions Disp Refills   testosterone cypionate (DEPOTESTOSTERONE CYPIONATE) 200 MG/ML injection [Pharmacy Med Name: TESTOSTERONE CYP 200 MG/ML] 6 mL 0    Sig: INJECT 1 ML (200 MG TOTAL) INTO THE MUSCLE EVERY 14 DAYS    Date of patient request: 11/27/22 Last office visit: 11/15/22 Date of last refill: 10/28/22 Last refill amount: 6mL

## 2022-12-10 ENCOUNTER — Ambulatory Visit (INDEPENDENT_AMBULATORY_CARE_PROVIDER_SITE_OTHER): Payer: BC Managed Care – PPO

## 2022-12-10 DIAGNOSIS — E291 Testicular hypofunction: Secondary | ICD-10-CM | POA: Diagnosis not present

## 2022-12-10 MED ORDER — TESTOSTERONE CYPIONATE 200 MG/ML IM SOLN
200.0000 mg | INTRAMUSCULAR | Status: DC
Start: 2022-12-10 — End: 2023-04-30
  Administered 2022-12-10 – 2023-02-18 (×5): 200 mg via INTRAMUSCULAR

## 2022-12-10 NOTE — Progress Notes (Signed)
Marcus Gutierrez is a 62 y.o. male presents to the office today for Testosterone injections, per physician's orders.   Testosterone Cypionate 200mg /2mL IM was administered Right upper outter quadrant today. Patient tolerated injection.  Patient next injection due: 12/24/22, appt made Yes   [name]

## 2022-12-23 ENCOUNTER — Ambulatory Visit: Payer: BC Managed Care – PPO

## 2022-12-24 ENCOUNTER — Ambulatory Visit (INDEPENDENT_AMBULATORY_CARE_PROVIDER_SITE_OTHER): Payer: BC Managed Care – PPO

## 2022-12-24 DIAGNOSIS — E291 Testicular hypofunction: Secondary | ICD-10-CM

## 2022-12-24 NOTE — Progress Notes (Signed)
Marcus Gutierrez is a 62 y.o. male presents to the office today for Testosterone Cypionate 200mg /mL injection  per physician's orders. Injection was administered Intramuscular Left upper quad. gluteus.   Patient's next injection due 01/07/2023, appt made? yes  Marcus Gutierrez

## 2022-12-30 ENCOUNTER — Other Ambulatory Visit: Payer: Self-pay | Admitting: Family Medicine

## 2022-12-30 DIAGNOSIS — J309 Allergic rhinitis, unspecified: Secondary | ICD-10-CM

## 2023-01-06 ENCOUNTER — Ambulatory Visit: Payer: BC Managed Care – PPO

## 2023-01-22 ENCOUNTER — Ambulatory Visit (INDEPENDENT_AMBULATORY_CARE_PROVIDER_SITE_OTHER): Payer: BC Managed Care – PPO | Admitting: Family Medicine

## 2023-01-22 DIAGNOSIS — R7989 Other specified abnormal findings of blood chemistry: Secondary | ICD-10-CM

## 2023-01-22 DIAGNOSIS — E291 Testicular hypofunction: Secondary | ICD-10-CM | POA: Diagnosis not present

## 2023-01-22 NOTE — Progress Notes (Signed)
Marcus Gutierrez is a 62 y.o. male presents to the office today for Testosterone injection per physician's orders. Injection was administered Intramuscular Right upper quad. gluteus.   Patient's next injection due , appt made? yes  Ester Rink

## 2023-02-04 ENCOUNTER — Ambulatory Visit: Payer: BC Managed Care – PPO

## 2023-02-04 DIAGNOSIS — E291 Testicular hypofunction: Secondary | ICD-10-CM | POA: Diagnosis not present

## 2023-02-04 NOTE — Progress Notes (Signed)
Marcus Gutierrez is a 62 y.o. male presents to the office today for Testosterone Cypionate 200mg /mL per physician's orders. Injection was administered Intramuscular Left upper quad. gluteus.   Patient's next injection due 14 days 02/18/2023, appt made? yes  [name]

## 2023-02-17 ENCOUNTER — Ambulatory Visit: Payer: 59

## 2023-02-18 ENCOUNTER — Ambulatory Visit (INDEPENDENT_AMBULATORY_CARE_PROVIDER_SITE_OTHER): Payer: 59

## 2023-02-18 DIAGNOSIS — E291 Testicular hypofunction: Secondary | ICD-10-CM

## 2023-02-18 NOTE — Progress Notes (Addendum)
 Marcus Gutierrez is a 63 y.o. male presents to the office today for Testosterone  Cypionate per physician's orders. Injection was administered Intramuscular Right upper quad. gluteus.   Patient's next injection due 14 days, appt made? yes  [employer]

## 2023-03-03 ENCOUNTER — Ambulatory Visit: Payer: 59

## 2023-03-04 ENCOUNTER — Ambulatory Visit: Payer: 59

## 2023-03-05 ENCOUNTER — Ambulatory Visit: Payer: 59 | Admitting: Family Medicine

## 2023-03-05 ENCOUNTER — Encounter: Payer: Self-pay | Admitting: Family Medicine

## 2023-03-05 ENCOUNTER — Other Ambulatory Visit: Payer: Self-pay

## 2023-03-05 VITALS — BP 116/66 | HR 78 | Temp 98.1°F | Ht 69.0 in | Wt 177.4 lb

## 2023-03-05 DIAGNOSIS — M25562 Pain in left knee: Secondary | ICD-10-CM

## 2023-03-05 DIAGNOSIS — E291 Testicular hypofunction: Secondary | ICD-10-CM

## 2023-03-05 MED ORDER — DICLOFENAC SODIUM 1 % EX GEL
4.0000 g | Freq: Four times a day (QID) | CUTANEOUS | 0 refills | Status: DC
Start: 2023-03-05 — End: 2023-10-01

## 2023-03-05 MED ORDER — TESTOSTERONE CYPIONATE 200 MG/ML IM SOLN
200.0000 mg | INTRAMUSCULAR | 0 refills | Status: DC
Start: 2023-03-05 — End: 2023-06-09

## 2023-03-05 NOTE — Progress Notes (Signed)
Subjective:  Patient ID: Marcus Gutierrez, male    DOB: [birth year]  Age: 63 y.o. MRN: [id number]  CC:  Chief Complaint  Patient presents with   Leg Pain    Lt leg started with pain about a week ago, notes had difficulty walking, notes side of knee is where pain ins concentrated has improved some since last week but stairs are proving very difficult    HPI Marcus Gutierrez presents for   Left leg pain Started 1 week ago. No known injury. Did have to climb up on a table, no pain that day. Next morning - pain on outside of left knee and just behind. No swelling, redness or wounds. Felt like may give way, no falls.  Tx: rest. No meds.  Improved now, but sore to walk down steps. Stiff if sitting for a long time.   Remote hx of pud - 5 years ago. On prilosec daily.  No nsaids d/t crohns.      History Patient Active Problem List   Diagnosis Date Noted   Genetic testing 02/13/2021   Family history of pancreatic cancer 01/15/2021   Family history of ovarian cancer 01/15/2021   Family history of brain cancer 01/15/2021   Family history of prostate cancer 01/15/2021   Acne rosacea, papular type 10/26/2020   Depression 03/05/2015   Rising PSA level 02/13/2015   Colitis 12/11/2011   Hypogonadism male 11/29/2011   Past Medical History:  Diagnosis Date   Allergy    Family history of ovarian cancer 01/15/2021   Family history of pancreatic cancer 01/15/2021   Family history of prostate cancer 01/15/2021   GERD (gastroesophageal reflux disease)    Seizures (HCC)    Ulcer    Ulcerative colitis (HCC)    Past Surgical History:  Procedure Laterality Date   HERNIA REPAIR     Allergies  Allergen Reactions   Amoxicillin Other (See Comments)    hematochezia   Azathioprine     Pt reports intense pain   Testosterone Other (See Comments)   Prior to Admission medications   Medication Sig Start Date End Date Taking? Authorizing Provider  ergocalciferol (VITAMIN D2) 1.25 MG (50000 UT)  capsule Take 50,000 Units by mouth once a week.   Yes [provider]  fluticasone (FLONASE) 50 MCG/ACT nasal spray PLACE 1 SPRAY INTO BOTH NOSTRILS 2 (TWO) TIMES DAILY 12/30/22  Yes Shade Flood, MD  levocetirizine (XYZAL) 5 MG tablet Take 1 tablet (5 mg total) by mouth every evening. 06/24/22  Yes Shade Flood, MD  Olopatadine HCl (PAZEO) 0.7 % SOLN    Yes [provider]  omeprazole (PRILOSEC) 40 MG capsule Take 40 mg by mouth daily.   Yes [provider]  predniSONE (DELTASONE) 10 MG tablet 3 tabs x3 days and then 2 tabs x3 days and then 1 tab x3 days.  Take w/ food. 11/15/22  Yes Sheliah Hatch, MD  testosterone cypionate (DEPOTESTOSTERONE CYPIONATE) 200 MG/ML injection Inject 1 mL (200 mg total) into the muscle every 14 (fourteen) days. 03/05/23  Yes Shade Flood, MD  ustekinumab (STELARA) 90 MG/ML SOSY injection Inject 90 mg into the skin every 8 (eight) weeks.   Yes [provider]   Social History   Socioeconomic History   Marital status: Unknown    Spouse name: Not on file   Number of children: Not on file   Years of education: Not on file   Highest education level: Not on file  Occupational History   Not on file  Tobacco Use   Smoking status: Former    Current packs/day: 0.00    Types: Cigarettes    Quit date: 06/01/2003    Years since quitting: 19.7   Smokeless tobacco: Never  Vaping Use   Vaping status: Never Used  Substance and Sexual Activity   Alcohol use: Yes    Comment: rare occasion   Drug use: No   Sexual activity: Yes  Other Topics Concern   Not on file  Social History Narrative   Not on file   Social Drivers of Health   Financial Resource Strain: Not on file  Food Insecurity: Not on file  Transportation Needs: Not on file  Physical Activity: Not on file  Stress: Not on file  Social Connections: Not on file  Intimate Partner Violence: Not on file    Review of Systems Per HPI.   Objective:    Vitals:   03/05/23 1031  BP: 116/66  Pulse: 78  Temp: 98.1 F (36.7 C)  TempSrc: Temporal  SpO2: 98%  Weight: 177 lb 6.4 oz (80.5 kg)  Height: 5\' 9"  (1.753 m)     Physical Exam Constitutional:      General: He is not in acute distress.    Appearance: Normal appearance. He is well-developed.  HENT:     Head: Normocephalic and atraumatic.  Cardiovascular:     Rate and Rhythm: Normal rate.  Pulmonary:     Effort: Pulmonary effort is normal.  Musculoskeletal:     Comments: Left knee, skin intact, no erythema, no effusion.  Full range of motion.  No extensor lag.  Patella nontender.  Minimal discomfort over the medial and lateral joint line.  No significant crepitus.  Negative varus valgus, with medial joint pain on varus testing.  Negative McMurray.  Ambulating without assistive device.  Neurological:     Mental Status: He is alert and oriented to person, place, and time.  Psychiatric:        Mood and Affect: Mood normal.        Assessment & Plan:  Marcus Gutierrez is a 63 y.o. male . Acute pain of left knee - Plan: diclofenac Sodium (VOLTAREN ARTHRITIS PAIN) 1 % GEL Possible degenerative meniscal disease or degenerative joint disease with flare.  Overall reassuring exam at this time and symptoms have improved.  Deferred imaging at this time.  Topical diclofenac gel if cleared with his Crohn's specialist.  Will avoid oral NSAIDs at this time with history of peptic ulcer disease, and Crohn's.  Symptomatic care discussed with RTC precautions given.  Meds ordered this encounter  Medications   diclofenac Sodium (VOLTAREN ARTHRITIS PAIN) 1 % GEL    Sig: Apply 4 g topically 4 (four) times daily.    Dispense:  100 g    Refill:  0   Patient Instructions  I suspect you may have had a flare of arthritis or possible meniscus issue in your knee but glad to hear that is improving and your exam today is reassuring.  If needed you can try Voltaren gel up to 4 times per day  temporarily, but clear that medication with your Crohn's specialist first.  Tylenol over-the-counter is okay, see other information below about knee pain.  As long as it is improving I do not think x-ray or other treatment is needed at this time.  Follow-up if not continuing to improve in the next 2 weeks, or sooner if worse.  Take care  Acute Knee Pain, Adult Many things can cause knee pain. Sometimes, knee pain is sudden (acute). It may be caused by damage, swelling, or irritation of the muscles and tissues that support your knee. Pain may come from: A fall. An injury to the knee from twisting motions. A hit to the knee. Infection. The pain often goes away on its own with time and rest. If the pain does not go away, tests may be done to find out what is causing the pain. These may include: Imaging tests, such as an X-ray, MRI, CT scan, or ultrasound. Joint aspiration. In this test, fluid is removed from the knee and checked. Arthroscopy. In this test, a lighted tube is put in the knee and an image is shown on a screen. A biopsy. In this test, a health care provider will remove a small piece of tissue for testing. Follow these instructions at home: If you have a knee sleeve or brace that can be taken off:  Wear the knee sleeve or brace as told by your provider. Take it off only if your provider says that you can. Check the skin around it every day. Tell your provider if you see problems. Loosen the knee sleeve or brace if your toes tingle, are numb, or turn cold and blue. Keep the knee sleeve or brace clean and dry. Bathing If the knee sleeve or brace is not waterproof: Do not let it get wet. Cover it when you take a bath or shower. Use a cover that does not let any water in. Managing pain, stiffness, and swelling  If told, put ice on the area. If you have a knee sleeve or brace that you can take off, remove it as told. Put ice in a plastic bag. Place a towel between your skin and the  bag. Leave the ice on for 20 minutes, 2-3 times a day. If your skin turns bright red, take off the ice right away to prevent skin damage. The risk of damage is higher if you cannot feel pain, heat, or cold. Move your toes often to reduce stiffness and swelling. Raise the injured area above the level of your heart while you are sitting or lying down. Use a pillow to support your foot as needed. If told, use an elastic bandage to put pressure (compression) on your injured knee. This may control swelling, give support, and help with discomfort. Sleep with a pillow under your knee. Activity Rest your knee. Do not do things that cause pain or make pain worse. Do not stand or walk on your injured knee until you're told it's okay. Use crutches as told. Avoid activities where both feet leave the ground at the same time and put stress on the joints. Avoid running, jumping rope, and doing jumping jacks. Work with a physical therapist to make a safe exercise program if told. Physical therapy helps your knee move better and get stronger. Exercise as told. General instructions Take your medicines only as told by your provider. If you are overweight, work with your provider and an expert in healthy eating, called a dietician, to set goals to lose weight. Being overweight can make your knee hurt more. Do not smoke, vape, or use products with nicotine or tobacco in them. If you need help quitting, talk with your provider. Return to normal activities when you are told. Ask what things are safe for you to do. Watch for any changes in your symptoms. Keep all follow-up visits. Your provider will check  your healing and adjust treatments if needed. Contact a health care provider if: The knee pain does not stop. The knee pain changes or gets worse. You have a fever along with knee pain. Your knee is red or feels warm when you touch it. Your knee gives out or locks up. Get help right away if: Your knee swells and  the swelling gets worse. You cannot move your knee. You have very bad knee pain that does not get better with medicine. This information is not intended to replace advice given to you by your health care provider. Make sure you discuss any questions you have with your health care provider. Document Revised: 10/31/2022 Document Reviewed: 03/25/2022 Elsevier Patient Education  2024 Elsevier Inc.    Signed,   Meredith Staggers, MD [facility], [facility] 03/05/23 11:10 AM

## 2023-03-05 NOTE — Patient Instructions (Signed)
I suspect you may have had a flare of arthritis or possible meniscus issue in your knee but glad to hear that is improving and your exam today is reassuring.  If needed you can try Voltaren gel up to 4 times per day temporarily, but clear that medication with your Crohn's specialist first.  Tylenol over-the-counter is okay, see other information below about knee pain.  As long as it is improving I do not think x-ray or other treatment is needed at this time.  Follow-up if not continuing to improve in the next 2 weeks, or sooner if worse.  Take care  Acute Knee Pain, Adult Many things can cause knee pain. Sometimes, knee pain is sudden (acute). It may be caused by damage, swelling, or irritation of the muscles and tissues that support your knee. Pain may come from: A fall. An injury to the knee from twisting motions. A hit to the knee. Infection. The pain often goes away on its own with time and rest. If the pain does not go away, tests may be done to find out what is causing the pain. These may include: Imaging tests, such as an X-ray, MRI, CT scan, or ultrasound. Joint aspiration. In this test, fluid is removed from the knee and checked. Arthroscopy. In this test, a lighted tube is put in the knee and an image is shown on a screen. A biopsy. In this test, a health care provider will remove a small piece of tissue for testing. Follow these instructions at home: If you have a knee sleeve or brace that can be taken off:  Wear the knee sleeve or brace as told by your provider. Take it off only if your provider says that you can. Check the skin around it every day. Tell your provider if you see problems. Loosen the knee sleeve or brace if your toes tingle, are numb, or turn cold and blue. Keep the knee sleeve or brace clean and dry. Bathing If the knee sleeve or brace is not waterproof: Do not let it get wet. Cover it when you take a bath or shower. Use a cover that does not let any water  in. Managing pain, stiffness, and swelling  If told, put ice on the area. If you have a knee sleeve or brace that you can take off, remove it as told. Put ice in a plastic bag. Place a towel between your skin and the bag. Leave the ice on for 20 minutes, 2-3 times a day. If your skin turns bright red, take off the ice right away to prevent skin damage. The risk of damage is higher if you cannot feel pain, heat, or cold. Move your toes often to reduce stiffness and swelling. Raise the injured area above the level of your heart while you are sitting or lying down. Use a pillow to support your foot as needed. If told, use an elastic bandage to put pressure (compression) on your injured knee. This may control swelling, give support, and help with discomfort. Sleep with a pillow under your knee. Activity Rest your knee. Do not do things that cause pain or make pain worse. Do not stand or walk on your injured knee until you're told it's okay. Use crutches as told. Avoid activities where both feet leave the ground at the same time and put stress on the joints. Avoid running, jumping rope, and doing jumping jacks. Work with a physical therapist to make a safe exercise program if told. Physical therapy helps  your knee move better and get stronger. Exercise as told. General instructions Take your medicines only as told by your provider. If you are overweight, work with your provider and an expert in healthy eating, called a dietician, to set goals to lose weight. Being overweight can make your knee hurt more. Do not smoke, vape, or use products with nicotine or tobacco in them. If you need help quitting, talk with your provider. Return to normal activities when you are told. Ask what things are safe for you to do. Watch for any changes in your symptoms. Keep all follow-up visits. Your provider will check your healing and adjust treatments if needed. Contact a health care provider if: The knee pain  does not stop. The knee pain changes or gets worse. You have a fever along with knee pain. Your knee is red or feels warm when you touch it. Your knee gives out or locks up. Get help right away if: Your knee swells and the swelling gets worse. You cannot move your knee. You have very bad knee pain that does not get better with medicine. This information is not intended to replace advice given to you by your health care provider. Make sure you discuss any questions you have with your health care provider. Document Revised: 10/31/2022 Document Reviewed: 03/25/2022 Elsevier Patient Education  2024 [employer].

## 2023-03-05 NOTE — Progress Notes (Signed)
Controlled substance database reviewed, testosterone filled 11/27/2022.  Testosterone refill ordered.

## 2023-03-05 NOTE — Progress Notes (Signed)
Patient is at the pharmacy and his testosterone was not sent in. He has an appt this morning at 10:20

## 2023-03-18 ENCOUNTER — Ambulatory Visit (INDEPENDENT_AMBULATORY_CARE_PROVIDER_SITE_OTHER): Payer: 59

## 2023-03-18 ENCOUNTER — Ambulatory Visit: Payer: 59

## 2023-03-18 DIAGNOSIS — E291 Testicular hypofunction: Secondary | ICD-10-CM | POA: Diagnosis not present

## 2023-03-18 MED ORDER — TESTOSTERONE CYPIONATE 200 MG/ML IM SOLN
200.0000 mg | INTRAMUSCULAR | Status: DC
Start: 2023-03-18 — End: 2023-04-30
  Administered 2023-03-18 – 2023-04-01 (×2): 200 mg via INTRAMUSCULAR

## 2023-03-18 NOTE — Progress Notes (Signed)
 Marcus Gutierrez is a 63 y.o. male presents to the office today for testosterone  injection per physician's orders. Injection was administered Intramuscular Left vastus lateralis.  NDC 9856-0340-98 Lot: [id number] Exp: 07/11/2025  Patient's next injection due 2 weeks, appt made? yes  Burnard Gaskins

## 2023-03-31 ENCOUNTER — Ambulatory Visit: Payer: 59

## 2023-04-01 ENCOUNTER — Ambulatory Visit (INDEPENDENT_AMBULATORY_CARE_PROVIDER_SITE_OTHER): Payer: 59

## 2023-04-01 DIAGNOSIS — E291 Testicular hypofunction: Secondary | ICD-10-CM | POA: Diagnosis not present

## 2023-04-01 NOTE — Progress Notes (Signed)
 Marcus Gutierrez is a 63 y.o. male presents to the office today for testosterone 200mg  per mL per physician's orders. Injection was administered Intramuscular Right upper quad. gluteus.   Patient's next injection due 04/15/2023, appt made? yes  [facility] R Aleicia Kenagy

## 2023-04-04 ENCOUNTER — Other Ambulatory Visit (HOSPITAL_COMMUNITY): Payer: Self-pay

## 2023-04-04 ENCOUNTER — Telehealth: Payer: Self-pay | Admitting: Pharmacy Technician

## 2023-04-04 NOTE — Telephone Encounter (Signed)
 Pharmacy Patient Advocate Encounter  Received notification from [facility] that Prior Authorization for Diclofenac Sodium 1% gel  has been APPROVED from 04/04/2023 to 08/01/2023. Unable to obtain price due to refill too soon rejection, last fill date 04/04/2023 next available fill date 04/09/2023   PA #/Case ID/Reference #: [id number]

## 2023-04-04 NOTE — Telephone Encounter (Signed)
 Pharmacy Patient Advocate Encounter   Received notification from CoverMyMeds that prior authorization for Diclofenac Sodium 1% gel  is required/requested.   Insurance verification completed.   The patient is insured through [facility] .   Per test claim: PA required; PA submitted to above mentioned insurance via CoverMyMeds Key/confirmation #/EOC [id number] Status is pending

## 2023-04-14 ENCOUNTER — Ambulatory Visit (INDEPENDENT_AMBULATORY_CARE_PROVIDER_SITE_OTHER): Payer: 59 | Admitting: Family Medicine

## 2023-04-14 DIAGNOSIS — E291 Testicular hypofunction: Secondary | ICD-10-CM

## 2023-04-14 NOTE — Progress Notes (Signed)
 Marcus Gutierrez is a 63 y.o. male presents to the office today for testosterone injection per physician's orders. Injection was administered Intramuscular Left upper quad. gluteus.   Patient's next injection due 04/28/23, appt made? yes  Ester Rink

## 2023-04-16 MED ORDER — TESTOSTERONE CYPIONATE 200 MG/ML IM SOLN
200.0000 mg | INTRAMUSCULAR | Status: AC
Start: 2023-04-16 — End: ?
  Administered 2023-04-14 – 2024-02-16 (×7): 200 mg via INTRAMUSCULAR

## 2023-04-16 NOTE — Addendum Note (Signed)
 Addended by: [name] on: 04/16/2023 12:57 PM   Modules accepted: Orders

## 2023-04-24 ENCOUNTER — Ambulatory Visit: Admitting: Student in an Organized Health Care Education/Training Program

## 2023-04-24 ENCOUNTER — Encounter: Payer: Self-pay | Admitting: Student in an Organized Health Care Education/Training Program

## 2023-04-24 VITALS — BP 120/80 | HR 104 | Temp 98.6°F | Wt 175.0 lb

## 2023-04-24 DIAGNOSIS — J069 Acute upper respiratory infection, unspecified: Secondary | ICD-10-CM

## 2023-04-24 LAB — POCT INFLUENZA A/B
Influenza A, POC: NEGATIVE
Influenza B, POC: NEGATIVE

## 2023-04-24 LAB — POC COVID19 BINAXNOW: SARS Coronavirus 2 Ag: NEGATIVE

## 2023-04-24 NOTE — Assessment & Plan Note (Signed)
 Acute issue.  Signs and symptoms consistent with viral upper respiratory infection.  Rapid tests for influenza and COVID were negative today.  He is immunosuppressed on treatment for Crohn's disease, but seems to be recovering well for this.  No signs or symptoms of pneumonia.  Only mild sinusitis symptoms.  I do not think antibiotics will add much here.  We talked about supportive care, Tylenol, seems most likely that this will be self-limited and that he will recover in the coming 1-3 days.

## 2023-04-24 NOTE — Progress Notes (Signed)
   Acute Office Visit  Subjective:     Patient ID: Marcus Gutierrez, male    DOB: October 03, 1960, 63 y.o.   MRN: [id number]  Chief Complaint  Patient presents with   Sore Throat    Started Sunday and has since gotten worse   Cough    Started Sunday Cough and congestion, headache. Has been using OTC medications but does not seem to be helping. Up till today has had some diarrhea all week.  Temp yesterday of 100 and body aches.     HPI Patient is in today for 3 days of bodyaches, nasal congestion, and nonproductive cough.  Symptoms started on Saturday and have been progressive.  Now he is feeling congestion in his nose but not much sinus pressure.  He has had some subjective chills but no fevers.  Able to go to work yesterday but feeling too rundown to go to work today.  Eating and drinking well, no vomiting, no abdominal pain, no dyspnea, no chest pain.  He is immunosuppressed, uses Stelara injections for Crohn's disease.  He has had no sputum production, no pleuritic chest pain, no lightheadedness or presyncope symptoms      Objective:    BP 120/80   Pulse (!) 104   Temp 98.6 F (37 C) (Temporal)   Wt 175 lb (79.4 kg)   SpO2 99%   BMI 25.84 kg/m    Physical Exam  Gen: Tired appearing Eyes: Normal Ears: Left ear impacted with cerumen, right ear moderate cerumen but tympanic membrane looks normal Neck: No lymphadenopathy, normal thyroid Mouth: Moderately inflamed posterior oropharynx without exudate Heart: Regular, no murmur Lungs: Unlabored, clear throughout, no crackles or wheezing Ext: Warm, well-perfused, no edema      Assessment & Plan:   Problem List Items Addressed This Visit       Unprioritized   Viral URI - Primary   Acute issue.  Signs and symptoms consistent with viral upper respiratory infection.  Rapid tests for influenza and COVID were negative today.  He is immunosuppressed on treatment for Crohn's disease, but seems to be recovering well for this.  No signs  or symptoms of pneumonia.  Only mild sinusitis symptoms.  I do not think antibiotics will add much here.  We talked about supportive care, Tylenol, seems most likely that this will be self-limited and that he will recover in the coming 1-3 days.       No follow-ups on file.  Tyson Alias, MD

## 2023-04-28 ENCOUNTER — Ambulatory Visit (INDEPENDENT_AMBULATORY_CARE_PROVIDER_SITE_OTHER)

## 2023-04-28 DIAGNOSIS — E291 Testicular hypofunction: Secondary | ICD-10-CM | POA: Diagnosis not present

## 2023-04-28 DIAGNOSIS — R7989 Other specified abnormal findings of blood chemistry: Secondary | ICD-10-CM

## 2023-04-28 NOTE — Progress Notes (Signed)
 Marcus Gutierrez is a 63 y.o. male presents to the office today for testosterone injection per physician's orders. Injection was administered Intramuscular Right upper quad. gluteus.   Patient's next injection due 05/12/2023, appt made? yes  [facility] R Tata Timmins

## 2023-04-30 MED ORDER — TESTOSTERONE 200 MG IL PLLT
200.0000 mg | PELLET | Status: DC
Start: 2023-04-30 — End: 2023-04-30

## 2023-04-30 NOTE — Addendum Note (Signed)
 Addended by: Magdalene River on: 04/30/2023 04:45 PM   Modules accepted: Orders

## 2023-04-30 NOTE — Addendum Note (Signed)
 Addended by: Shary Decamp R on: 04/30/2023 11:49 AM   Modules accepted: Orders

## 2023-05-01 MED ORDER — TESTOSTERONE CYPIONATE 200 MG/ML IM SOLN
200.0000 mg | INTRAMUSCULAR | Status: AC
  Administered 2023-05-26 – 2024-03-16 (×12): 200 mg via INTRAMUSCULAR

## 2023-05-01 NOTE — Addendum Note (Signed)
 Addended by: Cordella Register on: 05/01/2023 08:21 AM   Modules accepted: Orders

## 2023-05-12 ENCOUNTER — Ambulatory Visit: Admitting: Family Medicine

## 2023-05-12 DIAGNOSIS — E291 Testicular hypofunction: Secondary | ICD-10-CM

## 2023-05-12 MED ORDER — TESTOSTERONE CYPIONATE 200 MG/ML IM SOLN
200.0000 mg | Freq: Once | INTRAMUSCULAR | Status: AC
Start: 2023-05-12 — End: 2023-05-12
  Administered 2023-05-12: 200 mg via INTRAMUSCULAR

## 2023-05-12 NOTE — Progress Notes (Signed)
 Pt came in on the Nurse schedule to receive Testosterone injection. Pt supplied and it was administered in the Rt Glute w/o any complaints.

## 2023-05-26 ENCOUNTER — Ambulatory Visit (INDEPENDENT_AMBULATORY_CARE_PROVIDER_SITE_OTHER)

## 2023-05-26 ENCOUNTER — Ambulatory Visit

## 2023-05-26 DIAGNOSIS — E291 Testicular hypofunction: Secondary | ICD-10-CM | POA: Diagnosis not present

## 2023-05-27 NOTE — Progress Notes (Signed)
 Marcus Gutierrez is a 63 y.o. male presents to the office today for testosterone injection per physician's orders. Injection was administered Intramuscular Left vastus lateralis.   Patient's next injection due 06/09/2023, appt made? yes  [facility]

## 2023-06-05 ENCOUNTER — Ambulatory Visit: Payer: Self-pay

## 2023-06-05 ENCOUNTER — Encounter: Payer: Self-pay | Admitting: Student in an Organized Health Care Education/Training Program

## 2023-06-05 ENCOUNTER — Ambulatory Visit (INDEPENDENT_AMBULATORY_CARE_PROVIDER_SITE_OTHER): Admitting: Student in an Organized Health Care Education/Training Program

## 2023-06-05 VITALS — BP 138/90 | HR 89 | Temp 98.6°F | Wt 180.0 lb

## 2023-06-05 DIAGNOSIS — L03213 Periorbital cellulitis: Secondary | ICD-10-CM | POA: Diagnosis not present

## 2023-06-05 MED ORDER — CLINDAMYCIN HCL 300 MG PO CAPS: 300.0000 mg | ORAL_CAPSULE | Freq: Four times a day (QID) | ORAL | 0 refills | Status: AC

## 2023-06-05 NOTE — Progress Notes (Signed)
   Acute Office Visit  Subjective:     Patient ID: Marcus Gutierrez, male    DOB: September 26, 1960, 63 y.o.   MRN: [id number]  Chief Complaint  Patient presents with   Eye Pain    Right eye pain and swelling with discharge that started yesterday. Eye irritation started Sunday or Monday  patient showed picture of right eye and was swollen shut this am.  Patient has used eye drops      HPI  63 year old person with Crohn's disease immunosuppressed on Stelara, here for 1 day of right eye swelling.  He started to notice increased crusting and drainage from the right eye 2 days ago.  He cleaned this up with a wet towel.  Then this morning he woke up and noticed significant swelling of the right eye.  He has no pain with movements of the eyeball.  He denies any changes in vision of the eye.  No fevers or chills.  No trauma to the eye, no bug bites.  He has been working around some dust, but denies getting anything stuck in his eye.      Objective:    BP (!) 138/90   Pulse 89   Temp 98.6 F (37 C) (Temporal)   Wt 180 lb (81.6 kg)   SpO2 99%   BMI 26.58 kg/m    Physical Exam  Gen: Well-appearing man, no distress Eyes: Right eyelids swollen and edematous, mildly erythematous.  No fluctuance or abscess or stye.  The conjunctiva looks normal, he has a cataract.  Normal pupillary light reflex.  No pain with extraocular motion, good range of motion of both eyes.      Assessment & Plan:   Problem List Items Addressed This Visit       Unprioritized   Preseptal cellulitis of right eye - Primary   Symptoms and exam are consistent today with a preseptal cellulitis of the right eye.  I am reassured that he has no pain with movement of the eye, and no vision changes, the eye looks healthy, so I do not think this is orbital cellulitis.  The swelling is more than I would expect with just a simple blepharitis or conjunctivitis.  He is immunosuppressed, so at increased risk for infections and  complications of this infection.  So I am going to start antibiotics.  He has an allergy to amoxicillin.  So I am going to start clindamycin  3 mg every 6 hours for 5 days.  I told him about the risk of antibiotic associated diarrhea or even potentially C. difficile, but I think the benefits of antibiotic treatment outweigh the risk in his case.  If he has worsening of his condition over the weekend he is to go to the emergency department.  If he is no better by Monday he is to come back and see me in the office.      Relevant Medications   clindamycin  (CLEOCIN ) 300 MG capsule    Meds ordered this encounter  Medications   clindamycin  (CLEOCIN ) 300 MG capsule    Sig: Take 1 capsule (300 mg total) by mouth every 6 (six) hours for 5 days.    Dispense:  20 capsule    Refill:  0    No follow-ups on file.  Ether Hercules, MD

## 2023-06-05 NOTE — Telephone Encounter (Signed)
 Copied from CRM [id number]. Topic: Clinical - Red Word Triage >> Jun 05, 2023  8:05 AM Varney Gentleman wrote: [facility] that prompted transfer to Nurse Triage: Allergic reaction to pollen right eye is swollen  Chief Complaint: right eye swollen Symptoms: swelling, itchy Frequency: constant Pertinent Negatives: Patient denies runny nose, fever Disposition: [] ED /[] Urgent Care (no appt availability in office) / [x] Appointment(In office/virtual)/ []  [facility]/ [] Home Care/ [] Refused Recommended Disposition /[]  Mobile Bus/ []  Follow-up with PCP Additional Notes: per protocol apt made for this am; care advice given, denies questions; instructed to go to ER if becomes worse.   Reason for Disposition  Eyelids are very swollen (shut or almost)  Answer Assessment - Initial Assessment Questions 1. SEVERITY: "How bad is the itching?"  (e.g., Scale 1-10; mild, moderate or severe)     Right eye swollen, itchy 2. ONSET: "When did the eye symptoms start?" (e.g., hours or days ago)     Woke up with matter this am 3. EYELIDS: "Are the eyelids swollen?" If Yes, ask: "How much?"     More of the top lid 4. EYE DISCHARGE: "Is there any discharge from the eye, or eyelid crusting?" If Yes, ask: "How much?"     crusting 5. TRIGGER: "What do you think triggered the allergic reaction?" (e.g., dust, smoke, pollen, new eye make-up)     States pollen 6. RECURRENT PROBLEM: "Have you experienced eye allergies before?" If Yes, ask: "When was the last time?" and "What medicine worked best in the past?"     Yes, has allergies 7. CONTACTS: "Do you wear contacts?"     glasses 8. OTHER SYMPTOMS: "Do you have any other symptoms?" (e.g., runny nose)     no 9. PREGNANCY: "Is there any chance you are pregnant?" "When was your last menstrual period?"     no  Protocols used: Eye - Allergy-A-AH

## 2023-06-05 NOTE — Assessment & Plan Note (Signed)
 Symptoms and exam are consistent today with a preseptal cellulitis of the right eye.  I am reassured that he has no pain with movement of the eye, and no vision changes, the eye looks healthy, so I do not think this is orbital cellulitis.  The swelling is more than I would expect with just a simple blepharitis or conjunctivitis.  He is immunosuppressed, so at increased risk for infections and complications of this infection.  So I am going to start antibiotics.  He has an allergy to amoxicillin.  So I am going to start clindamycin  3 mg every 6 hours for 5 days.  I told him about the risk of antibiotic associated diarrhea or even potentially C. difficile, but I think the benefits of antibiotic treatment outweigh the risk in his case.  If he has worsening of his condition over the weekend he is to go to the emergency department.  If he is no better by Monday he is to come back and see me in the office.

## 2023-06-09 ENCOUNTER — Ambulatory Visit (INDEPENDENT_AMBULATORY_CARE_PROVIDER_SITE_OTHER): Admitting: Student in an Organized Health Care Education/Training Program

## 2023-06-09 ENCOUNTER — Encounter: Payer: Self-pay | Admitting: Student in an Organized Health Care Education/Training Program

## 2023-06-09 VITALS — BP 132/78 | HR 103 | Temp 98.5°F | Ht 69.0 in

## 2023-06-09 DIAGNOSIS — L03213 Periorbital cellulitis: Secondary | ICD-10-CM | POA: Diagnosis not present

## 2023-06-09 DIAGNOSIS — H00019 Hordeolum externum unspecified eye, unspecified eyelid: Secondary | ICD-10-CM | POA: Insufficient documentation

## 2023-06-09 DIAGNOSIS — H00011 Hordeolum externum right upper eyelid: Secondary | ICD-10-CM | POA: Diagnosis not present

## 2023-06-09 DIAGNOSIS — E291 Testicular hypofunction: Secondary | ICD-10-CM | POA: Diagnosis not present

## 2023-06-09 MED ORDER — TESTOSTERONE CYPIONATE 200 MG/ML IM SOLN: 200.0000 mg | INTRAMUSCULAR | 0 refills | Status: DC

## 2023-06-09 NOTE — Assessment & Plan Note (Signed)
 200 mg of testosterone  given today in clinic.  He reports tolerating this medication well, reports good efficacy.  Last testosterone  level was in June 2024.  I asked him to return for a testosterone  recheck in the next 1-2 months.  I reminded him that this should be checked 7 days after his last injection.

## 2023-06-09 NOTE — Assessment & Plan Note (Signed)
 On exam today still has swelling at the medial upper eyelid, and today I can see a small blockage of the meibomian gland.  This was probably the likely culprit causing the diffuse swelling and infection of the upper eyelid that we saw last week.  He is immunosuppressed on Stelara so at risk for infections.  Eyelid is improving on antibiotics.  Now I recommended him to do supportive care for the hordeolum with warm compresses.  I also recommended he make an appointment with Dr. Candi Chafe for later this week, and they can discuss ways to remove the blockage if warm compresses do not work.

## 2023-06-09 NOTE — Assessment & Plan Note (Signed)
 Resolving nicely on antibiotics with clindamycin .  Tolerating the antibiotics well.  No high risk features, vision is preserved, no pain of the eyeball.  He is getting more days of clindamycin  that he is going to finish.

## 2023-06-09 NOTE — Progress Notes (Signed)
   Acute Office Visit  Subjective:     Patient ID: Marcus Gutierrez, male    DOB: 1961/01/02, 63 y.o.   MRN: [id number]   HPI  Patient is in today for close follow-up of eyelid swelling.  I saw the patient last week and made a diagnosis of preseptal cellulitis based on diffuse swelling of the upper eyelid.  He has tolerated antibiotics with clindamycin  well.  The swelling has improved.  He still having some drainage and crusting in the morning.  Denies any changes in vision.  No pain with movements of the eye.  Reports some tenderness at the medial aspect of the upper eyelid.  And he can feel a small knot inside the eyelid.  No fevers or chills.  Otherwise systemically doing well.      Objective:    BP 132/78   Pulse (!) 103   Temp 98.5 F (36.9 C) (Temporal)   Ht 5\' 9"  (1.753 m)   SpO2 96%   BMI 26.58 kg/m    Physical Exam  Gen: Well-appearing Eyes: Pupils equal and reactive to light, no erythema of the cornea, no discharge or drainage, upper eyelid is much less swollen today, there is still medial swelling, and on the edge of the upper eyelid I can see a small blockage beneath the eyelashes consistent with a hordeolum.      Assessment & Plan:   Problem List Items Addressed This Visit       Unprioritized   Hypogonadism male   200 mg of testosterone  given today in clinic.  He reports tolerating this medication well, reports good efficacy.  Last testosterone  level was in June 2024.  I asked him to return for a testosterone  recheck in the next 1-2 months.  I reminded him that this should be checked 7 days after his last injection.      Relevant Medications   testosterone  cypionate (DEPOTESTOSTERONE CYPIONATE) 200 MG/ML injection   Preseptal cellulitis of right eye   Resolving nicely on antibiotics with clindamycin .  Tolerating the antibiotics well.  No high risk features, vision is preserved, no pain of the eyeball.  He is getting more days of clindamycin  that he is going to  finish.      Hordeolum - Primary   On exam today still has swelling at the medial upper eyelid, and today I can see a small blockage of the meibomian gland.  This was probably the likely culprit causing the diffuse swelling and infection of the upper eyelid that we saw last week.  He is immunosuppressed on Stelara so at risk for infections.  Eyelid is improving on antibiotics.  Now I recommended him to do supportive care for the hordeolum with warm compresses.  I also recommended he make an appointment with Dr. Candi Chafe for later this week, and they can discuss ways to remove the blockage if warm compresses do not work.       Meds ordered this encounter  Medications   testosterone  cypionate (DEPOTESTOSTERONE CYPIONATE) 200 MG/ML injection    Sig: Inject 1 mL (200 mg total) into the muscle every 14 (fourteen) days.    Dispense:  6 mL    Refill:  0    No follow-ups on file.  Ether Hercules, MD

## 2023-06-23 ENCOUNTER — Ambulatory Visit

## 2023-07-09 ENCOUNTER — Encounter: Admitting: Family Medicine

## 2023-07-14 ENCOUNTER — Other Ambulatory Visit: Payer: Self-pay

## 2023-07-14 ENCOUNTER — Other Ambulatory Visit: Payer: Self-pay | Admitting: Student in an Organized Health Care Education/Training Program

## 2023-07-14 ENCOUNTER — Telehealth: Payer: Self-pay | Admitting: Family Medicine

## 2023-07-14 ENCOUNTER — Ambulatory Visit

## 2023-07-14 DIAGNOSIS — E291 Testicular hypofunction: Secondary | ICD-10-CM

## 2023-07-14 NOTE — Telephone Encounter (Signed)
 Copied from CRM [id number]. Topic: Clinical - Prescription Issue >> Jul 14, 2023 12:10 PM Clyde Darling P wrote: Reason for CRM: Pt advise during visit with Dr. Vallarie Gauze he was to prescribe testosterone  cypionate (DEPOTESTOSTERONE CYPIONATE) 200 MG/ML injection but is at the pharmacy and they advise they have not received it. Pt would like to know should  he come to visit to receive Inj ? Pt can be reached [phone]

## 2023-07-14 NOTE — Telephone Encounter (Signed)
 He is overdue for follow-up/labs.  26-month follow-up for physical recommended in May 2024.  Please let me know when appointment is scheduled and I can refill meds at that time.

## 2023-07-14 NOTE — Telephone Encounter (Signed)
 Requested Prescriptions   Pending Prescriptions Disp Refills   testosterone  cypionate (DEPOTESTOSTERONE CYPIONATE) 200 MG/ML injection 6 mL 0    Sig: Inject 1 mL (200 mg total) into the muscle every 14 (fourteen) days.     Date of patient request: 07/14/2023 Last office visit: Visit date not found Upcoming visit: Visit date not found Date of last refill: 06/09/2023 Last refill amount: 6mL

## 2023-07-15 ENCOUNTER — Ambulatory Visit (INDEPENDENT_AMBULATORY_CARE_PROVIDER_SITE_OTHER)

## 2023-07-15 DIAGNOSIS — E291 Testicular hypofunction: Secondary | ICD-10-CM | POA: Diagnosis not present

## 2023-07-15 NOTE — Telephone Encounter (Signed)
 Great. Thanks. Controlled substance database (PDMP) reviewed. No concerns appreciated.  However it appears he had 6 mL of testosterone  filled on 06/10/2023.  Should not need refill yet as that should be 6 doses.  Let me know if he is out of meds but otherwise can discuss timing of refill at his visit on Monday.

## 2023-07-15 NOTE — Telephone Encounter (Signed)
 Appointment has been made pt will be in Monday

## 2023-07-15 NOTE — Telephone Encounter (Signed)
 I did not see this message when I was reviewing the prescription refill request.  On his controlled substance database, that shows that medication was filled on 06/09/2023.  I called [facility] pharmacy.  Verified that that prescription was picked up on April 28.  It should have been for 6 doses, with dosing every other week.  Can we call the patient and find out what happened with that prescription?

## 2023-07-15 NOTE — Progress Notes (Signed)
 Marcus Gutierrez is a 63 y.o. male presents to the office today for Testosterone  injection per physician's orders. Injection was administered Intramuscular Left upper quad. gluteus.   Patient's next injection due 07/28/2023, appt made? yes  [facility] Dimetrius Montfort

## 2023-07-15 NOTE — Telephone Encounter (Signed)
 Appointment has been scheduled for next Monday

## 2023-07-21 ENCOUNTER — Ambulatory Visit: Admitting: Family Medicine

## 2023-07-21 ENCOUNTER — Ambulatory Visit

## 2023-07-21 ENCOUNTER — Ambulatory Visit (INDEPENDENT_AMBULATORY_CARE_PROVIDER_SITE_OTHER): Admitting: Family Medicine

## 2023-07-21 ENCOUNTER — Encounter: Payer: Self-pay | Admitting: Family Medicine

## 2023-07-21 VITALS — BP 102/70 | HR 91 | Temp 98.2°F | Wt 170.8 lb

## 2023-07-21 DIAGNOSIS — R7303 Prediabetes: Secondary | ICD-10-CM | POA: Diagnosis not present

## 2023-07-21 DIAGNOSIS — Z5181 Encounter for therapeutic drug level monitoring: Secondary | ICD-10-CM

## 2023-07-21 DIAGNOSIS — E291 Testicular hypofunction: Secondary | ICD-10-CM

## 2023-07-21 DIAGNOSIS — J309 Allergic rhinitis, unspecified: Secondary | ICD-10-CM | POA: Diagnosis not present

## 2023-07-21 NOTE — Progress Notes (Signed)
 Subjective:  Patient ID: Marcus Gutierrez, male    DOB: 06-12-1960  Age: 63 y.o. MRN: [id number]  CC:  Chief Complaint  Patient presents with   Medical Management of Chronic Issues    HPI MORT SMELSER presents for  Follow-up of chronic conditions Last visit in 5/24.  Planning on repeat cruise to [place], [place] in June next year. Working part time - 3 days per week at [employer]. 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    Allergic Rhinitis: Stable with certirizine. Has flonase  as needed - only occasional use.   Hypogonadism Treated with testosterone  injection 200 mg every 14 days which has been monitored, given here.  He does have a history of elevated PSA followed by urology, Dr. Parke Boll. Most recent dose of testosterone  given 6 days ago. No side effects.   Last testosterone  level 578 on 07/16/2022.  Did have recent labs at Dr. Parke Boll, treated for enlarged prostate. Had  PSA that was a little elevated, but thought to be due to recent injection.   Lab Results  Component Value Date   WBC 8.2 06/24/2022   WBC 8.2 06/24/2022   HGB 15.6 06/24/2022   HGB 15.6 06/24/2022   HCT 47.1 06/24/2022   HCT 47.1 06/24/2022   MCV 91.6 06/24/2022   MCV 91.6 06/24/2022   PLT 333.0 06/24/2022   PLT 333.0 06/24/2022   Lab Results  Component Value Date   CHOL 152 06/24/2022   HDL 52.30 06/24/2022   LDLCALC 85 06/24/2022   TRIG 73.0 06/24/2022   CHOLHDL 3 06/24/2022   Lab Results  Component Value Date   PSA1 0.7 03/13/2020   PSA1 1.2 04/14/2018   PSA1 2.4 07/03/2016   PSA 1.39 06/24/2022   PSA 5.16 (H) 12/07/2020   PSA 1.58 07/15/2015     Prediabetes: Diet/exercise approach.  Last testing in May of last year.  Weight has improved since that time, 180 down to 170 today.  Diet changes. Feels well at this weight.   Lab Results  Component Value Date   HGBA1C 6.0 06/24/2022   Wt Readings from Last 3 Encounters:  07/21/23 170 lb 12.8 oz (77.5 kg)  06/05/23 180 lb (81.6 kg)  04/24/23 175  lb (79.4 kg)   On triamcinolone cream and Cerave lotion for eczema.   History Patient Active Problem List   Diagnosis Date Noted   Hordeolum 06/09/2023   Preseptal cellulitis of right eye 06/05/2023   Viral URI 04/24/2023   Genetic testing 02/13/2021   Family history of pancreatic cancer 01/15/2021   Family history of ovarian cancer 01/15/2021   Family history of brain cancer 01/15/2021   Family history of prostate cancer 01/15/2021   Acne rosacea, papular type 10/26/2020   Depression 03/05/2015   Rising PSA level 02/13/2015   Colitis 12/11/2011   Hypogonadism male 11/29/2011   Past Medical History:  Diagnosis Date   Allergy    Family history of ovarian cancer 01/15/2021   Family history of pancreatic cancer 01/15/2021   Family history of prostate cancer 01/15/2021   GERD (gastroesophageal reflux disease)    Seizures (HCC)    Ulcer    Ulcerative colitis (HCC)    Past Surgical History:  Procedure Laterality Date   HERNIA REPAIR     Allergies  Allergen Reactions   Amoxicillin Other (See Comments)    hematochezia   Azathioprine     Pt reports intense pain   Testosterone  Other (See Comments)   Prior to Admission medications  Medication Sig Start Date End Date Taking? Authorizing Provider  diclofenac  Sodium (VOLTAREN  ARTHRITIS PAIN) 1 % GEL Apply 4 g topically 4 (four) times daily. 03/05/23  Yes Benjiman Bras, MD  ergocalciferol (VITAMIN D2) 1.25 MG (50000 UT) capsule Take 50,000 Units by mouth once a week.   Yes [provider]  fluticasone  (FLONASE ) 50 MCG/ACT nasal spray PLACE 1 SPRAY INTO BOTH NOSTRILS 2 (TWO) TIMES DAILY 12/30/22  Yes Benjiman Bras, MD  levocetirizine (XYZAL ) 5 MG tablet Take 1 tablet (5 mg total) by mouth every evening. 06/24/22  Yes Benjiman Bras, MD  Olopatadine HCl (PAZEO) 0.7 % SOLN    Yes [provider]  omeprazole (PRILOSEC) 40 MG capsule Take 40 mg by mouth daily.   Yes [provider]  testosterone   cypionate (DEPOTESTOSTERONE CYPIONATE) 200 MG/ML injection Inject 1 mL (200 mg total) into the muscle every 14 (fourteen) days. 06/09/23  Yes Ether Hercules, MD  ustekinumab (STELARA) 90 MG/ML SOSY injection Inject 90 mg into the skin every 8 (eight) weeks.   Yes [provider]   Social History   Socioeconomic History   Marital status: Unknown    Spouse name: Not on file   Number of children: Not on file   Years of education: Not on file   Highest education level: Not on file  Occupational History   Not on file  Tobacco Use   Smoking status: Former    Current packs/day: 0.00    Types: Cigarettes    Quit date: 06/01/2003    Years since quitting: 20.1   Smokeless tobacco: Never  Vaping Use   Vaping status: Never Used  Substance and Sexual Activity   Alcohol use: Yes    Comment: rare occasion   Drug use: No   Sexual activity: Yes  Other Topics Concern   Not on file  Social History Narrative   Not on file   Social Drivers of Health   Financial Resource Strain: Not on file  Food Insecurity: Not on file  Transportation Needs: Not on file  Physical Activity: Not on file  Stress: Not on file  Social Connections: Not on file  Intimate Partner Violence: Not on file    Review of Systems  Constitutional:  Negative for fatigue and unexpected weight change.  Eyes:  Negative for visual disturbance.  Respiratory:  Negative for cough, chest tightness and shortness of breath.   Cardiovascular:  Negative for chest pain, palpitations and leg swelling.  Gastrointestinal:  Negative for abdominal pain and blood in stool.  Neurological:  Negative for dizziness, light-headedness and headaches.     Objective:   Vitals:   07/21/23 1406  BP: 102/70  Pulse: 91  Temp: 98.2 F (36.8 C)  TempSrc: Oral  SpO2: 99%  Weight: 170 lb 12.8 oz (77.5 kg)     Physical Exam Vitals reviewed.  Constitutional:      Appearance: He is well-developed.  HENT:     Head:  Normocephalic and atraumatic.  Neck:     Vascular: No carotid bruit or JVD.  Cardiovascular:     Rate and Rhythm: Normal rate and regular rhythm.     Heart sounds: Normal heart sounds. No murmur heard. Pulmonary:     Effort: Pulmonary effort is normal.     Breath sounds: Normal breath sounds. No rales.  Musculoskeletal:     Right lower leg: No edema.     Left lower leg: No edema.  Skin:    General:  Skin is warm and dry.  Neurological:     Mental Status: He is alert and oriented to person, place, and time.  Psychiatric:        Mood and Affect: Mood normal.        Assessment & Plan:  GARISON GENOVA is a 63 y.o. male . Hypogonadism male - Plan: Comprehensive metabolic panel with GFR, Lipid panel, CBC, PSA, Testosterone  Medication monitoring encounter - Plan: Comprehensive metabolic panel with GFR, Lipid panel, CBC, PSA, Testosterone   - Tolerating current med regimen, check monitoring labs as above, 6 days out from last injection.  Check testosterone  level and adjust regimen accordingly.  72-month follow-up.  He does note that he had a slightly elevated PSA at his recent visit with urology but thought to be due to recent testosterone  injection.  Will check labs and review urology notes.  Prediabetes - Plan: Hemoglobin A1c  - Anticipate improved readings, weight is down 10 pounds from last year, commended and continued on diet/exercise approach.  Allergic rhinitis  - Stable with current regimen, dosing options of fluticasone  nasal spray discussed with RTC precautions.   No orders of the defined types were placed in this encounter.  There are no Patient Instructions on file for this visit.    Signed,   Caro Christmas, MD [facility], [facility] 07/21/23 3:07 PM

## 2023-07-24 LAB — TESTOSTERONE: Testosterone: 880.75 ng/dL (ref 300.00–890.00)

## 2023-07-28 ENCOUNTER — Other Ambulatory Visit

## 2023-07-28 ENCOUNTER — Ambulatory Visit (INDEPENDENT_AMBULATORY_CARE_PROVIDER_SITE_OTHER)

## 2023-07-28 DIAGNOSIS — E291 Testicular hypofunction: Secondary | ICD-10-CM

## 2023-07-28 NOTE — Progress Notes (Signed)
 Marcus Gutierrez is a 63 y.o. male presents to the office today for testosterone  cypionate per physician's orders. Injection was administered Intramuscular Right upper quad. gluteus.   Patient's next injection due 2 weeks , appt made? yes  [name]

## 2023-07-29 ENCOUNTER — Ambulatory Visit: Payer: Self-pay | Admitting: Family Medicine

## 2023-08-04 ENCOUNTER — Ambulatory Visit

## 2023-08-11 ENCOUNTER — Ambulatory Visit

## 2023-08-11 DIAGNOSIS — E291 Testicular hypofunction: Secondary | ICD-10-CM

## 2023-08-11 NOTE — Progress Notes (Signed)
 Marcus Gutierrez is a 63 y.o. male presents to the office today for Testosterone  Cypionate injection  per physician's orders. Injection was administered Intramuscular Left upper quad. gluteus.   Patient's next injection due 14 days, appt made? yes  [employer]

## 2023-08-25 ENCOUNTER — Ambulatory Visit (INDEPENDENT_AMBULATORY_CARE_PROVIDER_SITE_OTHER): Admitting: Family Medicine

## 2023-08-25 DIAGNOSIS — E291 Testicular hypofunction: Secondary | ICD-10-CM | POA: Diagnosis not present

## 2023-08-25 DIAGNOSIS — R7989 Other specified abnormal findings of blood chemistry: Secondary | ICD-10-CM

## 2023-08-25 NOTE — Progress Notes (Addendum)
 Marcus Gutierrez is a 63 y.o. male presents to the office today for testosterone  1mg  per physician's orders. Injection was administered rt upper outter Right upper quad. gluteus.   Patient's next injection due , appt made? yes  Marcus Gutierrez

## 2023-09-08 ENCOUNTER — Ambulatory Visit

## 2023-09-09 ENCOUNTER — Ambulatory Visit (INDEPENDENT_AMBULATORY_CARE_PROVIDER_SITE_OTHER)

## 2023-09-09 DIAGNOSIS — E291 Testicular hypofunction: Secondary | ICD-10-CM | POA: Diagnosis not present

## 2023-09-09 NOTE — Progress Notes (Signed)
 Marcus Gutierrez is a 63 y.o. male presents to the office today for testosterone  injection per physician's orders. Injection was administered Intramuscular Left upper quad. gluteus.   Patient's next injection due 2 weeks, appt made? yes  [name]

## 2023-09-22 ENCOUNTER — Other Ambulatory Visit: Payer: Self-pay | Admitting: Student in an Organized Health Care Education/Training Program

## 2023-09-22 ENCOUNTER — Ambulatory Visit (INDEPENDENT_AMBULATORY_CARE_PROVIDER_SITE_OTHER)

## 2023-09-22 ENCOUNTER — Ambulatory Visit

## 2023-09-22 DIAGNOSIS — E291 Testicular hypofunction: Secondary | ICD-10-CM

## 2023-09-22 NOTE — Telephone Encounter (Signed)
 Requested Prescriptions   Pending Prescriptions Disp Refills   testosterone  cypionate (DEPOTESTOSTERONE CYPIONATE) 200 MG/ML injection [Pharmacy Med Name: TESTOSTERONE  CYP 200 MG/ML] 6 mL 0    Sig: INJECT 1 ML (200 MG TOTAL) INTO THE MUSCLE EVERY 14 DAYS     Date of patient request: 09/22/2023 Last office visit: 06/09/2023 Upcoming visit: Visit date not found Date of last refill: 06/09/2023 Last refill amount: 6mL

## 2023-09-22 NOTE — Progress Notes (Signed)
 Marcus Gutierrez is a 63 y.o. male presents to the office today for Testosterone  cypionate injection per physician's orders. Injection was administered Intramuscular Left upper quad. gluteus.   Patient's next injection due 2 weeks   , appt made? yes  [name]

## 2023-09-23 NOTE — Telephone Encounter (Signed)
 Testosterone  refill request, controlled substance database reviewed.  6 mL last filled on 06/10/2023, previously 03/05/2023.  Medication discussed and labs obtained at his June 9 office visit, refill ordered.

## 2023-10-01 ENCOUNTER — Ambulatory Visit (INDEPENDENT_AMBULATORY_CARE_PROVIDER_SITE_OTHER): Admitting: Family Medicine

## 2023-10-01 ENCOUNTER — Encounter: Payer: Self-pay | Admitting: Family Medicine

## 2023-10-01 VITALS — BP 116/82 | HR 95 | Temp 98.6°F | Resp 23 | Ht 69.0 in | Wt 181.4 lb

## 2023-10-01 DIAGNOSIS — J309 Allergic rhinitis, unspecified: Secondary | ICD-10-CM

## 2023-10-01 DIAGNOSIS — U071 COVID-19: Secondary | ICD-10-CM | POA: Diagnosis not present

## 2023-10-01 DIAGNOSIS — R0981 Nasal congestion: Secondary | ICD-10-CM

## 2023-10-01 LAB — POCT COVID BINAXNOW CARD: SARS Coronavirus 2 Ag: POSITIVE — AB

## 2023-10-01 MED ORDER — NIRMATRELVIR/RITONAVIR (PAXLOVID)TABLET: 3.0000 | ORAL_TABLET | Freq: Two times a day (BID) | ORAL | 0 refills | Status: AC

## 2023-10-01 MED ORDER — LEVOCETIRIZINE DIHYDROCHLORIDE 5 MG PO TABS: 5.0000 mg | ORAL_TABLET | Freq: Every evening | ORAL | 3 refills | Status: AC

## 2023-10-01 NOTE — Patient Instructions (Signed)
 Follow up as needed or as scheduled Start the Paxlovid  as directed Drink LOTS of fluids REST! You can consider work tomorrow depending on how you feel Wear a mask around others until day 10 Call with any questions or concerns Hang in there!!

## 2023-10-01 NOTE — Progress Notes (Signed)
   Subjective:    Patient ID: Marcus Gutierrez, male    DOB: 1960/07/21, 63 y.o.   MRN: [id number]  HPI Sinus congestion- sxs started light Saturday night w/ 'queasy stomach'.  The next few days had a sore throat and dry cough.  Monday had significant congestion.  Was unable to sleep due to inability to breathe.  Continues to have diarrhea.  Taking Mucinex .  Denies HA today- yesterday had mild HA.  + night sweats.     Review of Systems For ROS see HPI     Objective:   Physical Exam Vitals reviewed.  Constitutional:      General: He is not in acute distress.    Appearance: Normal appearance. He is not ill-appearing.  HENT:     Head: Normocephalic and atraumatic.     Nose: Congestion present. No rhinorrhea.     Comments: No TTP over frontal or maxillary sinuses    Mouth/Throat:     Pharynx: No oropharyngeal exudate or posterior oropharyngeal erythema.  Eyes:     Extraocular Movements: Extraocular movements intact.     Conjunctiva/sclera: Conjunctivae normal.  Cardiovascular:     Rate and Rhythm: Normal rate and regular rhythm.  Pulmonary:     Effort: Pulmonary effort is normal. No respiratory distress.     Breath sounds: No wheezing or rhonchi.  Musculoskeletal:     Cervical back: Neck supple.  Lymphadenopathy:     Cervical: No cervical adenopathy.  Skin:    General: Skin is warm and dry.  Neurological:     General: No focal deficit present.     Mental Status: He is alert and oriented to person, place, and time.  Psychiatric:        Mood and Affect: Mood normal.        Behavior: Behavior normal.        Thought Content: Thought content normal.           Assessment & Plan:  COVID- new.  Pt's rapid test is instantly +.  Just inside the window for antiviral tx.  Prescription for Paxlovid  sent.  Work note provided.  Reviewed supportive care and red flags that should prompt return.  Pt expressed understanding and is in agreement w/ plan.

## 2023-10-06 ENCOUNTER — Ambulatory Visit

## 2023-10-07 ENCOUNTER — Ambulatory Visit (INDEPENDENT_AMBULATORY_CARE_PROVIDER_SITE_OTHER)

## 2023-10-07 DIAGNOSIS — E291 Testicular hypofunction: Secondary | ICD-10-CM

## 2023-10-07 NOTE — Progress Notes (Signed)
 Marcus Gutierrez is a 63 y.o. male presents to the office today for testosterone  injection per physician's orders. Injection was administered Intramuscular Left Upper Outer Quadrant  Patient's next injection due 10/21/23, appt made? yes  [name]

## 2023-10-20 ENCOUNTER — Ambulatory Visit

## 2023-10-27 ENCOUNTER — Ambulatory Visit (INDEPENDENT_AMBULATORY_CARE_PROVIDER_SITE_OTHER): Admitting: Family Medicine

## 2023-10-27 DIAGNOSIS — R7989 Other specified abnormal findings of blood chemistry: Secondary | ICD-10-CM

## 2023-10-27 NOTE — Progress Notes (Signed)
 Patient ID: Marcus Gutierrez, male   DOB: 12/15/1960, 63 y.o.   MRN: [id number]  VASILY FEDEWA is a 63 y.o. male presents to the office today for Dr.Greene per physician's orders. Injection was administered Intramuscular Left upper quad. gluteus.   Patient's next injection due 14 days, appt made? yes  [name]

## 2023-11-10 ENCOUNTER — Ambulatory Visit (INDEPENDENT_AMBULATORY_CARE_PROVIDER_SITE_OTHER): Admitting: Family Medicine

## 2023-11-10 DIAGNOSIS — R7989 Other specified abnormal findings of blood chemistry: Secondary | ICD-10-CM

## 2023-11-10 NOTE — Progress Notes (Signed)
 Marcus Gutierrez is a 63 y.o. male presents to the office today for Testosterone   per physician's orders. Injection was administered  Right upper quad. gluteus.   Patient's next injection due 1 month , appt made? yes  [name]

## 2023-11-24 ENCOUNTER — Ambulatory Visit

## 2023-11-24 DIAGNOSIS — E291 Testicular hypofunction: Secondary | ICD-10-CM | POA: Diagnosis not present

## 2023-11-24 NOTE — Progress Notes (Signed)
 Marcus Gutierrez is a 63 y.o. male presents in office today for a nurse visit for Testosterone  Injection.   Patient Injection was given in the  Right upper quad. gluteus. Patient tolerated injection well.   Patient's next injection due 10/27, appt made? yes/  [facility]

## 2023-12-08 ENCOUNTER — Ambulatory Visit (INDEPENDENT_AMBULATORY_CARE_PROVIDER_SITE_OTHER): Admitting: Family Medicine

## 2023-12-08 DIAGNOSIS — E291 Testicular hypofunction: Secondary | ICD-10-CM

## 2023-12-08 NOTE — Progress Notes (Signed)
 Marcus Gutierrez is a 63 y.o. male presents in office today for a nurse visit for Testosterone  Injection.   Injection tolerated well . Left glute   Patient's next injection due 12/22/23, appt made? yes

## 2023-12-22 ENCOUNTER — Ambulatory Visit (INDEPENDENT_AMBULATORY_CARE_PROVIDER_SITE_OTHER): Admitting: Family Medicine

## 2023-12-22 ENCOUNTER — Ambulatory Visit

## 2023-12-22 DIAGNOSIS — R7989 Other specified abnormal findings of blood chemistry: Secondary | ICD-10-CM

## 2023-12-22 MED ORDER — TESTOSTERONE CYPIONATE 200 MG/ML IM SOLN
200.0000 mg | Freq: Once | INTRAMUSCULAR | Status: AC
  Administered 2024-01-19: 200 mg via INTRAMUSCULAR

## 2023-12-22 NOTE — Progress Notes (Signed)
 Marcus Gutierrez is a 63 y.o. male presents in office today for a nurse visit for Testosterone  Injection.   Patient Injection was given in the  Left upper quad. gluteus. Patient tolerated injection well.   Patient's next injection due 2 weeks , appt made? yes  [name]

## 2023-12-26 ENCOUNTER — Other Ambulatory Visit: Payer: Self-pay | Admitting: Family Medicine

## 2023-12-26 DIAGNOSIS — E291 Testicular hypofunction: Secondary | ICD-10-CM

## 2023-12-26 NOTE — Telephone Encounter (Signed)
 Controlled substance database reviewed.  Testosterone  last filled 09/23/2023, previously 06/10/2023.  84-day supply.  Screening labs in June. Refill ordered.

## 2023-12-26 NOTE — Telephone Encounter (Signed)
 Requested Prescriptions   Pending Prescriptions Disp Refills   testosterone  cypionate (DEPOTESTOSTERONE CYPIONATE) 200 MG/ML injection [Pharmacy Med Name: TESTOSTERONE  CYP 200 MG/ML] 6 mL 0    Sig: INJECT 1 ML (200 MG TOTAL) INTO THE MUSCLE EVERY 14 DAYS     Date of patient request: 12/26/2023 Last office visit: 12/22/2023 Upcoming visit: 01/19/2024 Date of last refill: 09/23/2023 Last refill amount: 6mL

## 2024-01-05 ENCOUNTER — Ambulatory Visit

## 2024-01-06 ENCOUNTER — Ambulatory Visit (INDEPENDENT_AMBULATORY_CARE_PROVIDER_SITE_OTHER)

## 2024-01-06 ENCOUNTER — Ambulatory Visit

## 2024-01-06 DIAGNOSIS — E291 Testicular hypofunction: Secondary | ICD-10-CM

## 2024-01-06 NOTE — Progress Notes (Signed)
 Patient is in office today for a nurse visit for Testosterone Injection. Patient Injection was given in the  Right upper quad. gluteus. Patient tolerated injection well.

## 2024-01-19 ENCOUNTER — Ambulatory Visit: Admitting: Family Medicine

## 2024-01-19 ENCOUNTER — Encounter: Payer: Self-pay | Admitting: Family Medicine

## 2024-01-19 VITALS — BP 128/78 | HR 87 | Temp 97.9°F | Resp 19 | Ht 69.0 in | Wt 181.6 lb

## 2024-01-19 DIAGNOSIS — Z23 Encounter for immunization: Secondary | ICD-10-CM

## 2024-01-19 DIAGNOSIS — E291 Testicular hypofunction: Secondary | ICD-10-CM

## 2024-01-19 DIAGNOSIS — Z13220 Encounter for screening for lipoid disorders: Secondary | ICD-10-CM

## 2024-01-19 DIAGNOSIS — R7303 Prediabetes: Secondary | ICD-10-CM

## 2024-01-19 DIAGNOSIS — Z79899 Other long term (current) drug therapy: Secondary | ICD-10-CM

## 2024-01-19 DIAGNOSIS — Z0000 Encounter for general adult medical examination without abnormal findings: Secondary | ICD-10-CM

## 2024-01-19 DIAGNOSIS — K219 Gastro-esophageal reflux disease without esophagitis: Secondary | ICD-10-CM | POA: Insufficient documentation

## 2024-01-19 LAB — COMPREHENSIVE METABOLIC PANEL WITH GFR
ALT: 15 U/L (ref 0–53)
AST: 17 U/L (ref 0–37)
Albumin: 4.2 g/dL (ref 3.5–5.2)
Alkaline Phosphatase: 82 U/L (ref 39–117)
BUN: 10 mg/dL (ref 6–23)
CO2: 29 meq/L (ref 19–32)
Calcium: 8.7 mg/dL (ref 8.4–10.5)
Chloride: 102 meq/L (ref 96–112)
Creatinine, Ser: 1.04 mg/dL (ref 0.40–1.50)
GFR: 76.91 mL/min (ref >60.00)
Glucose, Bld: 81 mg/dL (ref 70–99)
Potassium: 4.2 meq/L (ref 3.5–5.1)
Sodium: 140 meq/L (ref 135–145)
Total Bilirubin: 0.6 mg/dL (ref 0.2–1.2)
Total Protein: 7.4 g/dL (ref 6.0–8.3)

## 2024-01-19 LAB — MAGNESIUM: Magnesium: 2 mg/dL (ref 1.5–2.5)

## 2024-01-19 LAB — LIPID PANEL
Cholesterol: 158 mg/dL (ref 0–200)
HDL: 50.2 mg/dL (ref >39.00)
LDL Cholesterol: 95 mg/dL (ref 0–99)
NonHDL: 107.3
Total CHOL/HDL Ratio: 3
Triglycerides: 64 mg/dL (ref 0.0–149.0)
VLDL: 12.8 mg/dL (ref 0.0–40.0)

## 2024-01-19 LAB — HEMOGLOBIN A1C: Hgb A1c MFr Bld: 6.2 % (ref 4.6–6.5)

## 2024-01-19 LAB — VITAMIN D 25 HYDROXY (VIT D DEFICIENCY, FRACTURES): VITD: 23.3 ng/mL — ABNORMAL LOW (ref 30.00–100.00)

## 2024-01-19 LAB — VITAMIN B12: Vitamin B-12: 334 pg/mL (ref 211–911)

## 2024-01-19 NOTE — Progress Notes (Signed)
 Subjective:  Patient ID: Marcus Gutierrez, male    DOB: May 22, 1960  Age: 63 y.o. MRN: [id number]  CC:  Chief Complaint  Patient presents with   Annual Exam    Patient would like his testosterone  injection today    HPI Marcus Gutierrez presents for Annual Exam  PCP:me GI: Shahid at [facility] center, on Stelara for Crohn's every 42 days and working well. Has been stable. Urology - Dr. Carolee.    Hypogonadism Treated with testosterone  injection 200 mg every 14 days. Last dose given 01/06/2024 (13 days ago). Repeat dose to be given today.   Prior history of elevated PSA evaluated by Dr. Carolee.  Stable on labs in June. Urology note reviewed from Dr. Carolee on 06/17/2023.  30-month follow-up planned for repeat labs and was continued on same dose testosterone .  Prostate exam at that time approximately 50 g, no nodules. Recent lab visit and appt with urology last week. PSA 2.4, testosterone  680, hgb 15.2, hct 46.8 on 12/19/23. Plan for repeat labs in 3 months. Still on 200mg  dose.    Lab Results  Component Value Date   TESTOSTERONE  880.75 07/21/2023   Lab Results  Component Value Date   PSA1 0.7 03/13/2020   PSA1 1.2 04/14/2018   PSA1 2.4 07/03/2016   PSA 2.50 07/21/2023   PSA 1.39 06/24/2022   PSA 5.16 (H) 12/07/2020    Lab Results  Component Value Date   WBC 7.4 07/21/2023   HGB 14.7 07/21/2023   HCT 44.4 07/21/2023   MCV 90.0 07/21/2023   PLT 355.0 07/21/2023   Lab Results  Component Value Date   ALT 9 07/21/2023   AST 16 07/21/2023   ALKPHOS 67 07/21/2023   BILITOT 0.6 07/21/2023   Lab Results  Component Value Date   CHOL 132 07/21/2023   HDL 53.10 07/21/2023   LDLCALC 65 07/21/2023   TRIG 68.0 07/21/2023   CHOLHDL 2 07/21/2023   Prediabetes: Diet/exercise approach.  Weight has increased since June.  Stable from April. No change in diet/exercise.  Lab Results  Component Value Date   HGBA1C 6.3 07/21/2023   Wt Readings from Last 3 Encounters:  01/19/24  181 lb 9.6 oz (82.4 kg)  10/01/23 181 lb 6.4 oz (82.3 kg)  07/21/23 170 lb 12.8 oz (77.5 kg)   Eczema/atopic dermatitis Treated with CeraVe lotion, triamcinolone cream as needed.  GERD Prilosec 40 mg daily. Controlling GERD.  On vit d supplement.  Last vitamin D  No results found for: 25OHVITD2, 25OHVITD3, VD25OH  Allergic rhinitis Treated with Flonase  as needed, Xyzal  daily, some congestion at night at times.       01/19/2024    1:08 PM 03/05/2023   10:32 AM 06/24/2022    2:07 PM 01/22/2022    9:11 AM 12/10/2021    1:04 PM  Depression screen PHQ 2/9  Decreased Interest 0 0 0 0 0  Down, Depressed, Hopeless 0 0 0 0 0  PHQ - 2 Score 0 0 0 0 0  Altered sleeping 0 0 0 0 1  Tired, decreased energy 0 0 0 0 0  Change in appetite 0 0 0 0 0  Feeling bad or failure about yourself  0 0 0 0 0  Trouble concentrating 0 0 0 0 0  Moving slowly or fidgety/restless 0 0 0 0 0  Suicidal thoughts 0 0 0 0 0  PHQ-9 Score 0 0  0  0  1   Difficult doing work/chores Not difficult at  all  Not difficult at all       Data saved with a previous flowsheet row definition    Health Maintenance  Topic Date Due   Pneumococcal Vaccine: 50+ Years (2 of 2 - PCV) 04/04/2020   COVID-19 Vaccine (7 - Mixed Product risk 2025-26 season) 05/30/2024   DTaP/Tdap/Td (3 - Td or Tdap) 04/13/2028   Colonoscopy  04/27/2031   Influenza Vaccine  Completed   Hepatitis C Screening  Completed   HIV Screening  Completed   Zoster Vaccines- Shingrix  Completed   Hepatitis B Vaccines 19-59 Average Risk  Aged Out   HPV VACCINES  Aged Out   Meningococcal B Vaccine  Aged Out  Colonoscopy 04/26/2021, followed by GI as above.  Prostate: does not have family history of prostate cancer -  ongoing monitoring of PSA as above with use of testosterone  and followed by urology.   Lab Results  Component Value Date   PSA1 0.7 03/13/2020   PSA1 1.2 04/14/2018   PSA1 2.4 07/03/2016   PSA 2.50 07/21/2023   PSA 1.39 06/24/2022    PSA 5.16 (H) 12/07/2020      Immunization History  Administered Date(s) Administered   Influenza, Mdck, Trivalent,PF 6+ MOS(egg free) 11/30/2023   Influenza, Quadrivalent, Recombinant, Inj, Pf 11/25/2018   Influenza, Seasonal, Injecte, Preservative Fre 11/15/2022   Influenza,inj,Quad PF,6+ Mos 01/05/2013, 01/24/2014, 10/18/2014, 03/02/2020, 10/26/2020, 10/16/2021   Moderna Covid-19 Fall Seasonal Vaccine 67yrs & older 11/30/2023   Moderna Covid-19 Vaccine Bivalent Booster 55yrs & up 12/25/2020   Moderna Sars-Covid-2 Vaccination 04/22/2019, 05/25/2019   Pfizer Covid-19 Vaccine Bivalent Booster 19yrs & up 09/30/2019   Pneumococcal Polysaccharide-23 04/05/2019   Td 12/12/2006   Tdap 04/14/2018   Unspecified SARS-COV-2 Vaccination 11/16/2021   Zoster Recombinant(Shingrix) 08/09/2019, 02/10/2020  Recent flu vaccine and covid booster at [facility].  Prevnar 20 today.   No results found. Optho every other year. Glasses working well.   Dental: every 6 months, recent extraction.   Alcohol: none  Tobacco: none  Exercise: walking at work - active.     History Patient Active Problem List   Diagnosis Date Noted   Gastroesophageal reflux disease 01/19/2024   Hordeolum 06/09/2023   Preseptal cellulitis of right eye 06/05/2023   Viral URI 04/24/2023   Genetic testing 02/13/2021   Family history of pancreatic cancer 01/15/2021   Family history of ovarian cancer 01/15/2021   Family history of brain cancer 01/15/2021   Family history of prostate cancer 01/15/2021   Acne rosacea, papular type 10/26/2020   Depression 03/05/2015   Rising PSA level 02/13/2015   Colitis 12/11/2011   Hypogonadism male 11/29/2011   Past Medical History:  Diagnosis Date   Allergy    Family history of ovarian cancer 01/15/2021   Family history of pancreatic cancer 01/15/2021   Family history of prostate cancer 01/15/2021   GERD (gastroesophageal reflux disease)    Seizures (HCC)    Ulcer    Ulcerative colitis  (HCC)    Past Surgical History:  Procedure Laterality Date   HERNIA REPAIR     Allergies  Allergen Reactions   Amoxicillin Other (See Comments)    hematochezia   Azathioprine     Pt reports intense pain   Testosterone  Other (See Comments)    Androgel    Prior to Admission medications   Medication Sig Start Date End Date Taking? Authorizing Provider  ergocalciferol  (VITAMIN D2) 1.25 MG (50000 UT) capsule Take 50,000 Units by mouth once a week.  [provider]  fluticasone  (FLONASE ) 50 MCG/ACT nasal spray PLACE 1 SPRAY INTO BOTH NOSTRILS 2 (TWO) TIMES DAILY 12/30/22   Levora Reyes SAUNDERS, MD  levocetirizine (XYZAL ) 5 MG tablet Take 1 tablet (5 mg total) by mouth every evening. 10/01/23   Tabori, Katherine E, MD  Olopatadine HCl (PAZEO) 0.7 % SOLN     [provider]  omeprazole (PRILOSEC) 40 MG capsule Take 40 mg by mouth daily.    [provider]  testosterone  cypionate (DEPOTESTOSTERONE CYPIONATE) 200 MG/ML injection INJECT 1 ML (200 MG TOTAL) INTO THE MUSCLE EVERY 14 DAYS 12/26/23   Levora Reyes SAUNDERS, MD  triamcinolone cream (KENALOG) 0.1 % Apply 1 Application topically 2 (two) times daily. 09/23/23   [provider]  ustekinumab (STELARA) 90 MG/ML SOSY injection Inject 90 mg into the skin every 8 (eight) weeks.    [provider]   Social History   Socioeconomic History   Marital status: Unknown    Spouse name: Not on file   Number of children: Not on file   Years of education: Not on file   Highest education level: Not on file  Occupational History   Not on file  Tobacco Use   Smoking status: Former    Current packs/day: 0.00    Types: Cigarettes    Quit date: 06/01/2003    Years since quitting: 20.6   Smokeless tobacco: Never  Vaping Use   Vaping status: Never Used  Substance and Sexual Activity   Alcohol use: Yes    Comment: rare occasion   Drug use: No   Sexual activity: Yes  Other Topics Concern   Not on file  Social  History Narrative   Not on file   Social Drivers of Health   Financial Resource Strain: Not on file  Food Insecurity: Not on file  Transportation Needs: Not on file  Physical Activity: Not on file  Stress: Not on file  Social Connections: Not on file  Intimate Partner Violence: Not on file    Review of Systems 13 point review of systems per patient health survey noted.  Negative other than as indicated above or in HPI.    Objective:   Vitals:   01/19/24 1308  BP: 128/78  Pulse: 87  Resp: 19  Temp: 97.9 F (36.6 C)  TempSrc: Temporal  SpO2: 99%  Weight: 181 lb 9.6 oz (82.4 kg)  Height: 5' 9 (1.753 m)     Physical Exam Vitals reviewed.  Constitutional:      Appearance: He is well-developed.  HENT:     Head: Normocephalic and atraumatic.     Right Ear: External ear normal.     Left Ear: External ear normal.  Eyes:     Conjunctiva/sclera: Conjunctivae normal.     Pupils: Pupils are equal, round, and reactive to light.  Neck:     Thyroid: No thyromegaly.  Cardiovascular:     Rate and Rhythm: Normal rate and regular rhythm.     Heart sounds: Normal heart sounds.  Pulmonary:     Effort: Pulmonary effort is normal. No respiratory distress.     Breath sounds: Normal breath sounds. No wheezing.  Abdominal:     General: There is no distension.     Palpations: Abdomen is soft.     Tenderness: There is no abdominal tenderness.  Musculoskeletal:        General: No tenderness. Normal range of motion.     Cervical back: Normal range of motion and  neck supple.  Lymphadenopathy:     Cervical: No cervical adenopathy.  Skin:    General: Skin is warm and dry.  Neurological:     Mental Status: He is alert and oriented to person, place, and time.     Deep Tendon Reflexes: Reflexes are normal and symmetric.  Psychiatric:        Behavior: Behavior normal.        Assessment & Plan:  Marcus Gutierrez is a 63 y.o. male . Annual physical exam  - -anticipatory  guidance as below in AVS, screening labs above. Health maintenance items as above in HPI discussed/recommended as applicable.  Flonase  as needed for congestion at night, RTC precautions if that does not improve.  Prediabetes  - Check A1c, continue to watch diet, exercise.  Adjust plan accordingly based on lab results.  Hypogonadism male  - Recent visit and labs noted with urology.  No changes.  Long-term current use of proton pump inhibitor therapy  - Check B12, magnesium , vitamin D  with reported need for vitamin D  supplementation previously.  Continue PPI, follow-up with gastroenterology.  Screening for hyperlipidemia  - Check lipids, CMP, adjust plan accordingly.  No orders of the defined types were placed in this encounter.  Patient Instructions  Thank you for coming in today. No change in medications at this time. If there are any concerns on your bloodwork, I will let you know. Take care!        Signed,   Reyes Pines, MD [facility], [facility] 01/19/24 1:33 PM

## 2024-01-19 NOTE — Patient Instructions (Addendum)
 Thank you for coming in today. No change in medications at this time. If there are any concerns on your bloodwork, I will let you know. Take care! Preventive Care 62-62 Years Old, Male Preventive care refers to lifestyle choices and visits with your health care provider that can promote health and wellness. Preventive care visits are also called wellness exams. What can I expect for my preventive care visit? Counseling During your preventive care visit, your health care provider may ask about your: Medical history, including: Past medical problems. Family medical history. Current health, including: Emotional well-being. Home life and relationship well-being. Sexual activity. Lifestyle, including: Alcohol, nicotine or tobacco, and drug use. Access to firearms. Diet, exercise, and sleep habits. Safety issues such as seatbelt and bike helmet use. Sunscreen use. Work and work Astronomer. Physical exam Your health care provider will check your: Height and weight. These may be used to calculate your BMI (body mass index). BMI is a measurement that tells if you are at a healthy weight. Waist circumference. This measures the distance around your waistline. This measurement also tells if you are at a healthy weight and may help predict your risk of certain diseases, such as type 2 diabetes and high blood pressure. Heart rate and blood pressure. Body temperature. Skin for abnormal spots. What immunizations do I need?  Vaccines are usually given at various ages, according to a schedule. Your health care provider will recommend vaccines for you based on your age, medical history, and lifestyle or other factors, such as travel or where you work. What tests do I need? Screening Your health care provider may recommend screening tests for certain conditions. This may include: Lipid and cholesterol levels. Diabetes screening. This is done by checking your blood sugar (glucose) after you have not  eaten for a while (fasting). Hepatitis B test. Hepatitis C test. HIV (human immunodeficiency virus) test. STI (sexually transmitted infection) testing, if you are at risk. Lung cancer screening. Prostate cancer screening. Colorectal cancer screening. Talk with your health care provider about your test results, treatment options, and if necessary, the need for more tests. Follow these instructions at home: Eating and drinking  Eat a diet that includes fresh fruits and vegetables, whole grains, lean protein, and low-fat dairy products. Take vitamin and mineral supplements as recommended by your health care provider. Do not drink alcohol if your health care provider tells you not to drink. If you drink alcohol: Limit how much you have to 0-2 drinks a day. Know how much alcohol is in your drink. In the [place] one drink equals one 12 oz bottle of beer (355 mL), one 5 oz glass of wine (148 mL), or one 1 oz glass of hard liquor (44 mL). Lifestyle Brush your teeth every morning and night with fluoride toothpaste. Floss one time each day. Exercise for at least 30 minutes 5 or more days each week. Do not use any products that contain nicotine or tobacco. These products include cigarettes, chewing tobacco, and vaping devices, such as e-cigarettes. If you need help quitting, ask your health care provider. Do not use drugs. If you are sexually active, practice safe sex. Use a condom or other form of protection to prevent STIs. Take aspirin  only as told by your health care provider. Make sure that you understand how much to take and what form to take. Work with your health care provider to find out whether it is safe and beneficial for you to take aspirin  daily. Find healthy ways to manage  stress, such as: Meditation, yoga, or listening to music. Journaling. Talking to a trusted person. Spending time with friends and family. Minimize exposure to UV radiation to reduce your risk of skin  cancer. Safety Always wear your seat belt while driving or riding in a vehicle. Do not drive: If you have been drinking alcohol. Do not ride with someone who has been drinking. When you are tired or distracted. While texting. If you have been using any mind-altering substances or drugs. Wear a helmet and other protective equipment during sports activities. If you have firearms in your house, make sure you follow all gun safety procedures. What's next? Go to your health care provider once a year for an annual wellness visit. Ask your health care provider how often you should have your eyes and teeth checked. Stay up to date on all vaccines. This information is not intended to replace advice given to you by your health care provider. Make sure you discuss any questions you have with your health care provider. Document Revised: 07/26/2020 Document Reviewed: 07/26/2020 Elsevier Patient Education  2024 [employer].

## 2024-01-24 ENCOUNTER — Ambulatory Visit: Payer: Self-pay | Admitting: Family Medicine

## 2024-02-02 ENCOUNTER — Ambulatory Visit

## 2024-02-02 NOTE — Progress Notes (Signed)
 Patient takes takes 50,000 units once a week. Patient understood labs results

## 2024-02-03 ENCOUNTER — Ambulatory Visit (INDEPENDENT_AMBULATORY_CARE_PROVIDER_SITE_OTHER): Admitting: Family

## 2024-02-03 ENCOUNTER — Encounter: Payer: Self-pay | Admitting: Family

## 2024-02-03 ENCOUNTER — Ambulatory Visit

## 2024-02-03 VITALS — BP 130/84 | HR 94 | Temp 98.3°F | Wt 180.0 lb

## 2024-02-03 DIAGNOSIS — R222 Localized swelling, mass and lump, trunk: Secondary | ICD-10-CM

## 2024-02-03 DIAGNOSIS — E291 Testicular hypofunction: Secondary | ICD-10-CM

## 2024-02-03 NOTE — Progress Notes (Signed)
 Marcus Gutierrez is a 63 y.o. male presents in office today for a nurse visit for Testosterone  Injection.   Patient Injection was given in the  Left upper quad. gluteus. Patient tolerated injection well.   Patient's next injection due 02/18/2024, appt made? no  [employer]

## 2024-02-03 NOTE — Progress Notes (Signed)
 "  Acute Office Visit  Subjective:     Patient ID: Marcus Gutierrez, male    DOB: November 08, 1960, 63 y.o.   MRN: [id number]  Chief Complaint  Patient presents with   Mass    Bump on left breat on areola. Noticed it 2 weeks ago .     HPI Patient is in today with complaints of a mass that he noted to his left areola about 2 weeks ago.  It is painless.  The size is unchanged.  No drainage or discharge from the nipple.  No lymphadenopathy that he has noticed.  No family history of any breast issues.  Review of Systems  Constitutional: Negative.   Respiratory: Negative.    Cardiovascular: Negative.   Skin:        Mass under the left areola-nontender  All other systems reviewed and are negative.  Past Medical History:  Diagnosis Date   Allergy    Family history of ovarian cancer 01/15/2021   Family history of pancreatic cancer 01/15/2021   Family history of prostate cancer 01/15/2021   GERD (gastroesophageal reflux disease)    Seizures (HCC)    Ulcer    Ulcerative colitis (HCC)     Social History   Socioeconomic History   Marital status: Unknown    Spouse name: Not on file   Number of children: Not on file   Years of education: Not on file   Highest education level: Not on file  Occupational History   Not on file  Tobacco Use   Smoking status: Former    Current packs/day: 0.00    Types: Cigarettes    Quit date: 06/01/2003    Years since quitting: 20.6   Smokeless tobacco: Never  Vaping Use   Vaping status: Never Used  Substance and Sexual Activity   Alcohol use: Yes    Comment: rare occasion   Drug use: No   Sexual activity: Yes  Other Topics Concern   Not on file  Social History Narrative   Not on file   Social Drivers of Health   Tobacco Use: Medium Risk (02/03/2024)   Patient History    Smoking Tobacco Use: Former    Smokeless Tobacco Use: Never    Passive Exposure: Not on Actuary Strain: Not on file  Food  Insecurity: Not on file  Transportation Needs: Not on file  Physical Activity: Not on file  Stress: Not on file  Social Connections: Not on file  Intimate Partner Violence: Not on file  Depression (PHQ2-9): Low Risk (01/19/2024)   Depression (PHQ2-9)    PHQ-2 Score: 0  Alcohol Screen: Not on file  Housing: Not on file  Utilities: Not on file  Health Literacy: Not on file    Past Surgical History:  Procedure Laterality Date   HERNIA REPAIR      Family History  Problem Relation Age of Onset   Pancreatic cancer Mother 67   Heart attack Mother    Heart attack Father    Lupus Sister    Brain cancer Maternal Uncle 18   Prostate cancer Paternal Uncle        dx 28s   Ovarian cancer Maternal Grandmother        dx 23s   Bladder Cancer Paternal Grandmother 11   Asthma Son    Colon cancer Other 8       MGM's sister    Allergies[1]  Medications Ordered Prior to Encounter[2]  BP 130/84   Pulse  94   Temp 98.3 F (36.8 C) (Temporal)   Wt 180 lb (81.6 kg)   SpO2 97%   BMI 26.58 kg/m chart     Objective:    BP 130/84   Pulse 94   Temp 98.3 F (36.8 C) (Temporal)   Wt 180 lb (81.6 kg)   SpO2 97%   BMI 26.58 kg/m    Physical Exam Vitals and nursing note reviewed.  Constitutional:      Appearance: Normal appearance. He is normal weight.  Cardiovascular:     Rate and Rhythm: Normal rate and regular rhythm.     Pulses: Normal pulses.     Heart sounds: Normal heart sounds.  Pulmonary:     Effort: Pulmonary effort is normal.     Breath sounds: Normal breath sounds.  Chest:     Chest wall: Mass present. No deformity, tenderness or edema.  Breasts:    Right: Normal.     Left: Mass present. No swelling, bleeding, inverted nipple, nipple discharge, skin change or tenderness.       Comments: Mass noted at about 12:00 to the left nipple.  Nonmobile.  Nonpainful Neurological:     Mental Status: He is alert.  Psychiatric:        Mood and Affect: Mood  normal.        Behavior: Behavior normal.        Thought Content: Thought content normal.        Judgment: Judgment normal.    No results found for any visits on 02/03/24.      Assessment & Plan:   Problem List Items Addressed This Visit   None Visit Diagnoses       Mass of left chest wall    -  Primary   Relevant Orders   US  LIMITED ULTRASOUND INCLUDING AXILLA LEFT BREAST        No orders of the defined types were placed in this encounter.  Call the office with any questions or concerns.  Will follow-up pending the results of the ultrasound and sooner as needed. No follow-ups on file.  Dimitrious Micciche B Bingham Millette, FNP       [1] Allergies Allergen Reactions   Amoxicillin Other (See Comments)    hematochezia   Azathioprine     Pt reports intense pain   Testosterone  Other (See Comments)    Androgel   [2] Current Outpatient Medications on File Prior to Visit  Medication Sig Dispense Refill   ergocalciferol  (VITAMIN D2) 1.25 MG (50000 UT) capsule Take 50,000 Units by mouth once a week.     fluticasone  (FLONASE ) 50 MCG/ACT nasal spray PLACE 1 SPRAY INTO BOTH NOSTRILS 2 (TWO) TIMES DAILY 48 mL 2   HYDROcodone -acetaminophen  (NORCO/VICODIN) 5-325 MG tablet Take 1 tablet by mouth daily.     levocetirizine (XYZAL ) 5 MG tablet Take 1 tablet (5 mg total) by mouth every evening. 90 tablet 3   Olopatadine HCl (PAZEO) 0.7 % SOLN      omeprazole (PRILOSEC) 40 MG capsule Take 40 mg by mouth daily.     testosterone  cypionate (DEPOTESTOSTERONE CYPIONATE) 200 MG/ML injection INJECT 1 ML (200 MG TOTAL) INTO THE MUSCLE EVERY 14 DAYS 6 mL 0   triamcinolone cream (KENALOG) 0.1 % Apply 1 Application topically 2 (two) times daily.     ustekinumab (STELARA) 90 MG/ML SOSY injection Inject 90 mg into the skin every 8 (eight) weeks.     Current Facility-Administered Medications on File Prior to Visit  Medication Dose Route Frequency  Provider Last Rate Last Admin   testosterone  cypionate  (DEPOTESTOSTERONE CYPIONATE) injection 200 mg  200 mg Intramuscular Q14 Days Levora Reyes SAUNDERS, MD   200 mg at 02/03/24 1303   testosterone  cypionate (DEPOTESTOSTERONE CYPIONATE) injection 200 mg  200 mg Intramuscular Q14 Days Levora Reyes SAUNDERS, MD   200 mg at 10/07/23 1313  "

## 2024-02-16 ENCOUNTER — Ambulatory Visit (INDEPENDENT_AMBULATORY_CARE_PROVIDER_SITE_OTHER)

## 2024-02-16 DIAGNOSIS — E291 Testicular hypofunction: Secondary | ICD-10-CM | POA: Diagnosis not present

## 2024-02-16 NOTE — Progress Notes (Signed)
 Marcus Gutierrez is a 64 y.o. male presents in office today for a nurse visit for Testosterone  Injection.   Patient Injection was given in the  Right upper quad. gluteus. Patient tolerated injection well.   Patient's next injection due 03/01/2024, appt made? yes  [facility] R Marcus Gutierrez

## 2024-02-25 ENCOUNTER — Other Ambulatory Visit: Payer: Self-pay | Admitting: Family

## 2024-02-25 DIAGNOSIS — R222 Localized swelling, mass and lump, trunk: Secondary | ICD-10-CM

## 2024-02-27 ENCOUNTER — Ambulatory Visit
Admission: RE | Admit: 2024-02-27 | Discharge: 2024-02-27 | Disposition: A | Discharge status: Home / Self Care | Source: Ambulatory Visit | Attending: Family | Admitting: Family

## 2024-02-27 DIAGNOSIS — R222 Localized swelling, mass and lump, trunk: Secondary | ICD-10-CM

## 2024-03-01 ENCOUNTER — Ambulatory Visit

## 2024-03-01 DIAGNOSIS — E291 Testicular hypofunction: Secondary | ICD-10-CM | POA: Diagnosis not present

## 2024-03-04 NOTE — Progress Notes (Signed)
 Patient is in office today for a nurse visit for Testosterone  Injection. Patient received injection in left ventrogluteal and handled injection well.

## 2024-03-04 NOTE — Progress Notes (Signed)
 Completed and routed to provider for cosign.

## 2024-03-15 ENCOUNTER — Ambulatory Visit

## 2024-03-16 ENCOUNTER — Ambulatory Visit

## 2024-03-16 ENCOUNTER — Other Ambulatory Visit: Payer: Self-pay

## 2024-03-16 NOTE — Progress Notes (Signed)
 Patient is in office today for a nurse visit for Testosterone Injection. Patient Injection was given in the  Right upper quad. gluteus. Patient tolerated injection well.

## 2024-03-29 ENCOUNTER — Ambulatory Visit

## 2024-07-26 ENCOUNTER — Ambulatory Visit: Admitting: Family Medicine
